# Patient Record
Sex: Female | Born: 1965 | Race: White | Hispanic: No | Marital: Married | Smoking: Never smoker
Health system: Southern US, Community
[De-identification: ages and names within clinical notes are randomized; demographics above are authoritative.]

## PROBLEM LIST (undated history)

## (undated) DIAGNOSIS — E7210 Disorders of sulfur-bearing amino-acid metabolism, unspecified: Secondary | ICD-10-CM

## (undated) DIAGNOSIS — D509 Iron deficiency anemia, unspecified: Secondary | ICD-10-CM

## (undated) DIAGNOSIS — Z8241 Family history of sudden cardiac death: Secondary | ICD-10-CM

## (undated) DIAGNOSIS — D3A029 Benign carcinoid tumor of the large intestine, unspecified portion: Secondary | ICD-10-CM

## (undated) DIAGNOSIS — E559 Vitamin D deficiency, unspecified: Secondary | ICD-10-CM

## (undated) DIAGNOSIS — R002 Palpitations: Secondary | ICD-10-CM

## (undated) DIAGNOSIS — N943 Premenstrual tension syndrome: Secondary | ICD-10-CM

## (undated) DIAGNOSIS — E8840 Mitochondrial metabolism disorder, unspecified: Secondary | ICD-10-CM

## (undated) DIAGNOSIS — E782 Mixed hyperlipidemia: Secondary | ICD-10-CM

## (undated) DIAGNOSIS — E612 Magnesium deficiency: Secondary | ICD-10-CM

## (undated) HISTORY — DX: Mixed hyperlipidemia: E78.2

## (undated) HISTORY — DX: Iron deficiency anemia, unspecified: D50.9

## (undated) HISTORY — DX: Premenstrual tension syndrome: N94.3

## (undated) HISTORY — PX: WISDOM TOOTH EXTRACTION: SHX21

## (undated) HISTORY — DX: Magnesium deficiency: E61.2

## (undated) HISTORY — DX: Palpitations: R00.2

## (undated) HISTORY — PX: COLON SURGERY: SHX602

## (undated) HISTORY — DX: Disorders of sulfur-bearing amino-acid metabolism, unspecified: E72.10

## (undated) HISTORY — PX: COLONOSCOPY: SHX5424

## (undated) HISTORY — DX: Family history of sudden cardiac death: Z82.41

## (undated) HISTORY — DX: Vitamin D deficiency, unspecified: E55.9

## (undated) HISTORY — DX: Mitochondrial metabolism disorder, unspecified: E88.40

---

## 2000-08-21 ENCOUNTER — Other Ambulatory Visit: Admission: RE | Admit: 2000-08-21 | Discharge: 2000-08-21 | Payer: Self-pay | Admitting: *Deleted

## 2002-03-22 ENCOUNTER — Other Ambulatory Visit: Admission: RE | Admit: 2002-03-22 | Discharge: 2002-03-22 | Payer: Self-pay | Admitting: *Deleted

## 2003-01-08 HISTORY — PX: BREAST SURGERY: SHX581

## 2003-08-01 ENCOUNTER — Other Ambulatory Visit: Admission: RE | Admit: 2003-08-01 | Discharge: 2003-08-01 | Payer: Self-pay | Admitting: *Deleted

## 2003-09-27 ENCOUNTER — Ambulatory Visit (HOSPITAL_COMMUNITY): Admission: RE | Admit: 2003-09-27 | Discharge: 2003-09-27 | Payer: Self-pay | Admitting: General Surgery

## 2003-09-27 ENCOUNTER — Encounter (INDEPENDENT_AMBULATORY_CARE_PROVIDER_SITE_OTHER): Payer: Self-pay | Admitting: *Deleted

## 2004-07-18 ENCOUNTER — Other Ambulatory Visit: Admission: RE | Admit: 2004-07-18 | Discharge: 2004-07-18 | Payer: Self-pay | Admitting: *Deleted

## 2004-08-07 ENCOUNTER — Ambulatory Visit: Payer: Self-pay | Admitting: Internal Medicine

## 2005-09-16 ENCOUNTER — Other Ambulatory Visit: Admission: RE | Admit: 2005-09-16 | Discharge: 2005-09-16 | Payer: Self-pay | Admitting: *Deleted

## 2006-11-19 ENCOUNTER — Other Ambulatory Visit: Admission: RE | Admit: 2006-11-19 | Discharge: 2006-11-19 | Payer: Self-pay | Admitting: *Deleted

## 2008-01-28 ENCOUNTER — Other Ambulatory Visit: Admission: RE | Admit: 2008-01-28 | Discharge: 2008-01-28 | Payer: Self-pay | Admitting: Gynecology

## 2010-05-25 NOTE — Op Note (Signed)
NAMEJANECE, LAIDLAW                ACCOUNT NO.:  [id number]   MEDICAL RECORD NO.:  [id number]          PATIENT TYPE:  AMB   LOCATION:  DAY                          FACILITY:  [facility]   PHYSICIAN:  Timothy E. Earlene Plater, M.D. DATE OF BIRTH:  1965/09/28   DATE OF PROCEDURE:  09/27/2003  DATE OF DISCHARGE:                                 OPERATIVE REPORT   PREOPERATIVE DIAGNOSIS:  Persistent mass, right breast.   POSTOPERATIVE DIAGNOSIS:  Persistent mass, right breast.   PROCEDURE:  Excision of mass, right breast.   SURGEON:  Timothy E. Earlene Plater, M.D.   ANESTHESIA:  Local standby.   Ms. Dimas Aguas is an otherwise healthy 45 year old female, gravida 2, para 2,  with a persistent mass of the right breast.  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  She is seen and identified.  She and I both carefully located the  mass in the right lateral breast in the supine position.  It is an  approximately 1 cm slippery nodule.   The patient was taken to the operating room and placed supine, IV sedation  given.  The right breast was prepped and draped in the usual fashion.  At  the marked site, local anesthesia was provided with 0.25% Marcaine with  epinephrine.  A curvilinear incision was made in the skin crease over the  mass.  This was lengthened a little bit because I could not get my fingertip  in to palpate the mass and then to grasp it.  I did that, however, and  removed the mass in the surrounding tissue and cut the specimen on the table  to assure that the fibroadenoma was included.  The surrounding tissue was  carefully examined bimanually and no other masses were noted, though the  remainder of her tissue was quite dense and rubbery.  There was no bleeding  complications.  The wound was closed with interrupted 4-0 Monocryls and  Steri-Strips.  A dry,  sterile dressing was applied.  Counts were correct.  She tolerated the procedure well and was taken to the recovery room in good  condition.  Written and verbal instructions were given.  She will seen in  follow up in the office.     TED/MEDQ  D:  09/27/2003  T:  09/27/2003  Job:  [id number]   cc:   Almedia Balls. Fore, M.D.  [phone] [address] [place]  [place] [address]  Fax: [phone]

## 2014-07-02 ENCOUNTER — Emergency Department (HOSPITAL_COMMUNITY)
Admission: EM | Admit: 2014-07-02 | Discharge: 2014-07-02 | Disposition: A | Discharge status: Home / Self Care | Payer: BLUE CROSS/BLUE SHIELD | Attending: Emergency Medicine | Admitting: Emergency Medicine

## 2014-07-02 ENCOUNTER — Emergency Department (HOSPITAL_COMMUNITY): Payer: BLUE CROSS/BLUE SHIELD

## 2014-07-02 ENCOUNTER — Encounter (HOSPITAL_COMMUNITY): Payer: Self-pay | Admitting: Emergency Medicine

## 2014-07-02 DIAGNOSIS — R0789 Other chest pain: Secondary | ICD-10-CM | POA: Diagnosis not present

## 2014-07-02 DIAGNOSIS — R202 Paresthesia of skin: Secondary | ICD-10-CM | POA: Diagnosis not present

## 2014-07-02 DIAGNOSIS — R079 Chest pain, unspecified: Secondary | ICD-10-CM

## 2014-07-02 DIAGNOSIS — R200 Anesthesia of skin: Secondary | ICD-10-CM | POA: Insufficient documentation

## 2014-07-02 LAB — BASIC METABOLIC PANEL
Anion gap: 6 (ref 5–15)
BUN: 15 mg/dL (ref 6–20)
CO2: 29 mmol/L (ref 22–32)
Calcium: 8.6 mg/dL — ABNORMAL LOW (ref 8.9–10.3)
Chloride: 104 mmol/L (ref 101–111)
Creatinine, Ser: 0.62 mg/dL (ref 0.44–1.00)
GFR calc non Af Amer: >60 mL/min (ref >60)
GLUCOSE: 124 mg/dL — AB (ref 65–99)
Potassium: 3.5 mmol/L (ref 3.5–5.1)
SODIUM: 139 mmol/L (ref 135–145)

## 2014-07-02 LAB — CBC
HEMATOCRIT: 34.2 % — AB (ref 36.0–46.0)
HEMOGLOBIN: 10.2 g/dL — AB (ref 12.0–15.0)
MCH: 21.7 pg — AB (ref 26.0–34.0)
MCHC: 29.8 g/dL — AB (ref 30.0–36.0)
MCV: 72.8 fL — ABNORMAL LOW (ref 78.0–100.0)
Platelets: 257 10*3/uL (ref 150–400)
RBC: 4.7 MIL/uL (ref 3.87–5.11)
RDW: 18.2 % — AB (ref 11.5–15.5)
WBC: 7.2 10*3/uL (ref 4.0–10.5)

## 2014-07-02 LAB — I-STAT TROPONIN, ED: TROPONIN I, POC: 0 ng/mL (ref 0.00–0.08)

## 2014-07-02 MED ORDER — PANTOPRAZOLE SODIUM 20 MG PO TBEC: 20.0000 mg | DELAYED_RELEASE_TABLET | Freq: Every day | ORAL | Status: DC

## 2014-07-02 MED ORDER — SUCRALFATE 1 G PO TABS: 1.0000 g | ORAL_TABLET | Freq: Three times a day (TID) | ORAL | Status: DC

## 2014-07-02 NOTE — ED Notes (Signed)
EKG delayed due to technical issues. Patient in NAD.

## 2014-07-02 NOTE — Discharge Instructions (Signed)
Chest Pain (Nonspecific) °It is often hard to give a specific diagnosis for the cause of chest pain. There is always a chance that your pain could be related to something serious, such as a heart attack or a blood clot in the lungs. You need to follow up with your health care provider for further evaluation. °CAUSES  °· Heartburn. °· Pneumonia or bronchitis. °· Anxiety or stress. °· Inflammation around your heart (pericarditis) or lung (pleuritis or pleurisy). °· A blood clot in the lung. °· A collapsed lung (pneumothorax). It can develop suddenly on its own (spontaneous pneumothorax) or from trauma to the chest. °· Shingles infection (herpes zoster virus). °The chest wall is composed of bones, muscles, and cartilage. Any of these can be the source of the pain. °· The bones can be bruised by injury. °· The muscles or cartilage can be strained by coughing or overwork. °· The cartilage can be affected by inflammation and become sore (costochondritis). °DIAGNOSIS  °Lab tests or other studies may be needed to find the cause of your pain. Your health care provider may have you take a test called an ambulatory electrocardiogram (ECG). An ECG records your heartbeat patterns over a 24-hour period. You may also have other tests, such as: °· Transthoracic echocardiogram (TTE). During echocardiography, sound waves are used to evaluate how blood flows through your heart. °· Transesophageal echocardiogram (TEE). °· Cardiac monitoring. This allows your health care provider to monitor your heart rate and rhythm in real time. °· Holter monitor. This is a portable device that records your heartbeat and can help diagnose heart arrhythmias. It allows your health care provider to track your heart activity for several days, if needed. °· Stress tests by exercise or by giving medicine that makes the heart beat faster. °TREATMENT  °· Treatment depends on what may be causing your chest pain. Treatment may include: °¨ Acid blockers for  heartburn. °¨ Anti-inflammatory medicine. °¨ Pain medicine for inflammatory conditions. °¨ Antibiotics if an infection is present. °· You may be advised to change lifestyle habits. This includes stopping smoking and avoiding alcohol, caffeine, and chocolate. °· You may be advised to keep your head raised (elevated) when sleeping. This reduces the chance of acid going backward from your stomach into your esophagus. °Most of the time, nonspecific chest pain will improve within 2-3 days with rest and mild pain medicine.  °HOME CARE INSTRUCTIONS  °· If antibiotics were prescribed, take them as directed. Finish them even if you start to feel better. °· For the next few days, avoid physical activities that bring on chest pain. Continue physical activities as directed. °· Do not use any tobacco products, including cigarettes, chewing tobacco, or electronic cigarettes. °· Avoid drinking alcohol. °· Only take medicine as directed by your health care provider. °· Follow your health care provider's suggestions for further testing if your chest pain does not go away. °· Keep any follow-up appointments you made. If you do not go to an appointment, you could develop lasting (chronic) problems with pain. If there is any problem keeping an appointment, call to reschedule. °SEEK MEDICAL CARE IF:  °· Your chest pain does not go away, even after treatment. °· You have a rash with blisters on your chest. °· You have a fever. °SEEK IMMEDIATE MEDICAL CARE IF:  °· You have increased chest pain or pain that spreads to your arm, neck, jaw, back, or abdomen. °· You have shortness of breath. °· You have an increasing cough, or you cough   up blood.  You have severe back or abdominal pain.  You feel nauseous or vomit.  You have severe weakness.  You faint.  You have chills. This is an emergency. Do not wait to see if the pain will go away. Get medical help at once. Call your local emergency services (911 in [place] Do not drive  yourself to the hospital. MAKE SURE YOU:   Understand these instructions.  Will watch your condition.  Will get help right away if you are not doing well or get worse. Document Released: 10/03/2004 Document Revised: 12/29/2012 Document Reviewed: 07/30/2007 [facility] Patient Information 2015 [place], [place]. This information is not intended to replace advice given to you by your health care provider. Make sure you discuss any questions you have with your health care provider. Paresthesia Paresthesia is an abnormal burning or prickling sensation. This sensation is generally felt in the hands, arms, legs, or feet. However, it may occur in any part of the body. It is usually not painful. The feeling may be described as:  Tingling or numbness.  "Pins and needles."  Skin crawling.  Buzzing.  Limbs "falling asleep."  Itching. Most people experience temporary (transient) paresthesia at some time in their lives. CAUSES  Paresthesia may occur when you breathe too quickly (hyperventilation). It can also occur without any apparent cause. Commonly, paresthesia occurs when pressure is placed on a nerve. The feeling quickly goes away once the pressure is removed. For some people, however, paresthesia is a long-lasting (chronic) condition caused by an underlying disorder. The underlying disorder may be:  A traumatic, direct injury to nerves. Examples include a:  Broken (fractured) neck.  Fractured skull.  A disorder affecting the brain and spinal cord (central nervous system). Examples include:  Transverse myelitis.  Encephalitis.  Transient ischemic attack.  Multiple sclerosis.  Stroke.  Tumor or blood vessel problems, such as an arteriovenous malformation pressing against the brain or spinal cord.  A condition that damages the peripheral nerves (peripheral neuropathy). Peripheral nerves are not part of the brain and spinal cord. These conditions include:  Diabetes.  Peripheral  vascular disease.  Nerve entrapment syndromes, such as carpal tunnel syndrome.  Shingles.  Hypothyroidism.  Vitamin B12 deficiencies.  Alcoholism.  Heavy metal poisoning (lead, arsenic).  Rheumatoid arthritis.  Systemic lupus erythematosus. DIAGNOSIS  Your caregiver will attempt to find the underlying cause of your paresthesia. Your caregiver may:  Take your medical history.  Perform a physical exam.  Order various lab tests.  Order imaging tests. TREATMENT  Treatment for paresthesia depends on the underlying cause. HOME CARE INSTRUCTIONS  Avoid drinking alcohol.  You may consider massage or acupuncture to help relieve your symptoms.  Keep all follow-up appointments as directed by your caregiver. SEEK IMMEDIATE MEDICAL CARE IF:   You feel weak.  You have trouble walking or moving.  You have problems with speech or vision.  You feel confused.  You cannot control your bladder or bowel movements.  You feel numbness after an injury.  You faint.  Your burning or prickling feeling gets worse when walking.  You have pain, cramps, or dizziness.  You develop a rash. MAKE SURE YOU:  Understand these instructions.  Will watch your condition.  Will get help right away if you are not doing well or get worse. Document Released: 12/14/2001 Document Revised: 03/18/2011 Document Reviewed: 09/14/2010 [facility] Patient Information 2015 [place], [place]. This information is not intended to replace advice given to you by your health care provider. Make sure you  discuss any questions you have with your health care provider. ° °

## 2014-07-02 NOTE — ED Notes (Signed)
Pt from home c/o central chest pain, left arm tingling and numbness x few days. She reports recent travel to [place]. She denies nausea, shortness of breath, or dizziness. She reports she is very fit but had a family member die from a heart attack at an earyl age.

## 2014-07-02 NOTE — ED Notes (Signed)
Pt reports numbness on the left side of her body.

## 2014-07-02 NOTE — ED Provider Notes (Signed)
CSN: [id number]     Arrival date & time 07/02/14  0009 History   First MD Initiated Contact with Patient 07/02/14 0041     Chief Complaint  Patient presents with  . Chest Pain     (Consider location/radiation/quality/duration/timing/severity/associated sxs/prior Treatment) Patient is a 49 y.o. female presenting with chest pain. The history is provided by the patient. No language interpreter was used.  Chest Pain Pain location:  L chest and R chest Pain radiates to:  Does not radiate Pain radiates to the back: no   Associated symptoms: numbness   Associated symptoms: no abdominal pain, no cough, no fever, no headache, no nausea, no shortness of breath, not vomiting and no weakness   Associated symptoms comment:  She complains of bilateral chest pain that has been intermittent for the past 4 days. No SOB, nausea or vomiting. No fever. She started taking Clindamycin following a dental procedure for infection of an upper left molar tooth the day prior to start of symptoms. She has been able to exercise daily per her usual work out schedule without recurrent chest pain. Today she started having numbness in the left arm and leg without weakness which prompted a visit to the emergency department.    History reviewed. No pertinent past medical history. Past Surgical History  Procedure Laterality Date  . Cardiac surgery     No family history on file. History  Substance Use Topics  . Smoking status: Never Smoker   . Smokeless tobacco: Not on file  . Alcohol Use: Yes     Comment: social   OB History    No data available     Review of Systems  Constitutional: Negative for fever and chills.  HENT: Negative.  Negative for congestion and sore throat.   Eyes: Negative for visual disturbance.  Respiratory: Negative.  Negative for cough and shortness of breath.   Cardiovascular: Positive for chest pain.  Gastrointestinal: Negative.  Negative for nausea, vomiting and abdominal pain.   Musculoskeletal: Negative.  Negative for neck pain and neck stiffness.  Skin: Negative.   Neurological: Positive for numbness. Negative for facial asymmetry, weakness and headaches.      Allergies  Review of patient's allergies indicates no known allergies.  Home Medications   Prior to Admission medications   Not on File   BP 103/77 mmHg  Pulse 69  Temp(Src) 98.6 F (37 C) (Oral)  Resp 18  Wt 128 lb (58.06 kg)  SpO2 100%  LMP 01/31/2014 Physical Exam  Constitutional: She is oriented to person, place, and time. She appears well-developed and well-nourished.  HENT:  Head: Normocephalic.  Eyes: Pupils are equal, round, and reactive to light.  Neck: Normal range of motion. Neck supple.  Cardiovascular: Normal rate and regular rhythm.   Pulmonary/Chest: Effort normal and breath sounds normal.  Abdominal: Soft. Bowel sounds are normal. There is no tenderness. There is no rebound and no guarding.  Musculoskeletal: Normal range of motion.  Neurological: She is alert and oriented to person, place, and time. She has normal strength and normal reflexes. No sensory deficit. She displays a negative Romberg sign. Coordination normal.  CN's 3-12 grossly intact. Speech clear and focused. No deficits of coordination. Ambulatory without imbalance.   Skin: Skin is warm and dry. No rash noted.  Psychiatric: She has a normal mood and affect.    ED Course  Procedures (including critical care time) Labs Review Labs Reviewed  [facility] [place], ED  Results for orders placed or performed during the hospital encounter of 07/02/14  CBC  Result Value Ref Range   WBC 7.2 4.0 - 10.5 K/uL   RBC 4.70 3.87 - 5.11 MIL/uL   Hemoglobin 10.2 (L) 12.0 - 15.0 g/dL   HCT 34.2 (L) 36.0 - 46.0 %   MCV 72.8 (L) 78.0 - 100.0 fL   MCH 21.7 (L) 26.0 - 34.0 pg   MCHC 29.8 (L) 30.0 - 36.0 g/dL   RDW 18.2 (H) 11.5 - 15.5 %   Platelets 257 150 - 400 K/uL  Basic metabolic panel   Result Value Ref Range   Sodium 139 135 - 145 mmol/L   Potassium 3.5 3.5 - 5.1 mmol/L   Chloride 104 101 - 111 mmol/L   CO2 29 22 - 32 mmol/L   Glucose, Bld 124 (H) 65 - 99 mg/dL   BUN 15 6 - 20 mg/dL   Creatinine, Ser 0.62 0.44 - 1.00 mg/dL   Calcium 8.6 (L) 8.9 - 10.3 mg/dL   GFR calc non Af Amer >60 >60 mL/min   GFR calc Af Amer >60 >60 mL/min   Anion gap 6 5 - 15  I-stat troponin, ED  (not at [facility], [facility])  Result Value Ref Range   Troponin i, poc 0.00 0.00 - 0.08 ng/mL   Comment 3           Dg Chest 2 View  07/02/2014   CLINICAL DATA:  Chest pain  EXAM: CHEST  2 VIEW  COMPARISON:  None.  FINDINGS: Lungs are clear.  No pleural effusion or pneumothorax.  The heart is normal in size.  Visualized osseous structures are within normal limits.  IMPRESSION: No evidence of acute cardiopulmonary disease.   Electronically Signed   By: Julian Hy M.D.   On: 07/02/2014 01:33   Ct Head Wo Contrast  07/02/2014   CLINICAL DATA:  Left body numbness  EXAM: CT HEAD WITHOUT CONTRAST  TECHNIQUE: Contiguous axial images were obtained from the base of the skull through the vertex without intravenous contrast.  COMPARISON:  None.  FINDINGS: No evidence of parenchymal hemorrhage or extra-axial fluid collection. No mass lesion, mass effect, or midline shift.  No CT evidence of acute infarction.  Cerebral volume is within normal limits.  No ventriculomegaly.  The visualized paranasal sinuses are essentially clear. The mastoid air cells are unopacified.  No evidence of calvarial fracture.  IMPRESSION: Normal head CT.   Electronically Signed   By: Julian Hy M.D.   On: 07/02/2014 01:49     Imaging Review No results found.   EKG Interpretation   Date/Time:  Saturday July 02 2014 00:33:14 EDT Ventricular Rate:  65 PR Interval:  145 QRS Duration: 85 QT Interval:  426 QTC Calculation: 443 R Axis:   80 Text Interpretation:  Sinus rhythm No old tracing to compare Confirmed by  OTTER  MD, OLGA  ([id number]) on 07/02/2014 12:44:17 AM      MDM   Final diagnoses:  Chest pain    1. Nonspecific Chest pain 2. Paresthesia  She appears comfortable in the emergency department. Her neurologic exam is non-focal and her cardiac work up is negative.  CT head negative She is evaluated by Dr. Sharol Given and is found to be stable for discharge home. She will see her doctor for further outpatient evaluation of intermittent chest pain and [facility] for evaluation of paresthesia.     Charlann Lange, PA-C 07/02/14 [place], MD 07/02/14 [phone]

## 2014-07-21 ENCOUNTER — Encounter (HOSPITAL_COMMUNITY): Payer: Self-pay | Admitting: *Deleted

## 2014-07-21 ENCOUNTER — Emergency Department (HOSPITAL_COMMUNITY)
Admission: EM | Admit: 2014-07-21 | Discharge: 2014-07-21 | Disposition: A | Discharge status: Home / Self Care | Payer: BLUE CROSS/BLUE SHIELD | Attending: Emergency Medicine | Admitting: Emergency Medicine

## 2014-07-21 DIAGNOSIS — R112 Nausea with vomiting, unspecified: Secondary | ICD-10-CM | POA: Diagnosis not present

## 2014-07-21 DIAGNOSIS — Z3202 Encounter for pregnancy test, result negative: Secondary | ICD-10-CM | POA: Insufficient documentation

## 2014-07-21 DIAGNOSIS — Z880 Allergy status to penicillin: Secondary | ICD-10-CM | POA: Insufficient documentation

## 2014-07-21 DIAGNOSIS — R1084 Generalized abdominal pain: Secondary | ICD-10-CM | POA: Diagnosis not present

## 2014-07-21 LAB — CBC WITH DIFFERENTIAL/PLATELET
BASOS ABS: 0 10*3/uL (ref 0.0–0.1)
Basophils Relative: 0 % (ref 0–1)
EOS ABS: 0 10*3/uL (ref 0.0–0.7)
Eosinophils Relative: 0 % (ref 0–5)
HEMATOCRIT: 37.5 % (ref 36.0–46.0)
HEMOGLOBIN: 11.1 g/dL — AB (ref 12.0–15.0)
LYMPHS ABS: 0.8 10*3/uL (ref 0.7–4.0)
LYMPHS PCT: 11 % — AB (ref 12–46)
MCH: 21.5 pg — ABNORMAL LOW (ref 26.0–34.0)
MCHC: 29.6 g/dL — ABNORMAL LOW (ref 30.0–36.0)
MCV: 72.5 fL — ABNORMAL LOW (ref 78.0–100.0)
MONOS PCT: 3 % (ref 3–12)
Monocytes Absolute: 0.2 10*3/uL (ref 0.1–1.0)
NEUTROS ABS: 5.9 10*3/uL (ref 1.7–7.7)
Neutrophils Relative %: 86 % — ABNORMAL HIGH (ref 43–77)
Platelets: 263 10*3/uL (ref 150–400)
RBC: 5.17 MIL/uL — AB (ref 3.87–5.11)
RDW: 17 % — ABNORMAL HIGH (ref 11.5–15.5)
WBC: 6.9 10*3/uL (ref 4.0–10.5)

## 2014-07-21 LAB — URINALYSIS, ROUTINE W REFLEX MICROSCOPIC
Bilirubin Urine: NEGATIVE
Glucose, UA: NEGATIVE mg/dL
Hgb urine dipstick: NEGATIVE
Leukocytes, UA: NEGATIVE
Nitrite: NEGATIVE
PROTEIN: NEGATIVE mg/dL
SPECIFIC GRAVITY, URINE: 1.028 (ref 1.005–1.030)
UROBILINOGEN UA: 0.2 mg/dL (ref 0.0–1.0)
pH: 6.5 (ref 5.0–8.0)

## 2014-07-21 LAB — COMPREHENSIVE METABOLIC PANEL
ALT: 13 U/L — AB (ref 14–54)
ANION GAP: 7 (ref 5–15)
AST: 22 U/L (ref 15–41)
Albumin: 3.9 g/dL (ref 3.5–5.0)
Alkaline Phosphatase: 54 U/L (ref 38–126)
BILIRUBIN TOTAL: 0.5 mg/dL (ref 0.3–1.2)
BUN: 14 mg/dL (ref 6–20)
CALCIUM: 8.7 mg/dL — AB (ref 8.9–10.3)
CHLORIDE: 102 mmol/L (ref 101–111)
CO2: 27 mmol/L (ref 22–32)
Creatinine, Ser: 0.72 mg/dL (ref 0.44–1.00)
Glucose, Bld: 127 mg/dL — ABNORMAL HIGH (ref 65–99)
POTASSIUM: 3.7 mmol/L (ref 3.5–5.1)
SODIUM: 136 mmol/L (ref 135–145)
TOTAL PROTEIN: 7 g/dL (ref 6.5–8.1)

## 2014-07-21 LAB — URINE MICROSCOPIC-ADD ON

## 2014-07-21 LAB — LIPASE, BLOOD: Lipase: 18 U/L — ABNORMAL LOW (ref 22–51)

## 2014-07-21 LAB — PREGNANCY, URINE: Preg Test, Ur: NEGATIVE

## 2014-07-21 MED ORDER — FENTANYL CITRATE (PF) 100 MCG/2ML IJ SOLN
50.0000 ug | Freq: Once | INTRAMUSCULAR | Status: AC
  Administered 2014-07-21: 50 ug via INTRAVENOUS
  Filled 2014-07-21: qty 2

## 2014-07-21 MED ORDER — ONDANSETRON HCL 4 MG PO TABS: 4.0000 mg | ORAL_TABLET | Freq: Four times a day (QID) | ORAL | Status: DC

## 2014-07-21 MED ORDER — ONDANSETRON HCL 4 MG/2ML IJ SOLN
4.0000 mg | Freq: Once | INTRAMUSCULAR | Status: AC
  Administered 2014-07-21: 4 mg via INTRAVENOUS
  Filled 2014-07-21: qty 2

## 2014-07-21 MED ORDER — SODIUM CHLORIDE 0.9 % IV BOLUS (SEPSIS)
1000.0000 mL | Freq: Once | INTRAVENOUS | Status: AC
  Administered 2014-07-21: 1000 mL via INTRAVENOUS

## 2014-07-21 NOTE — ED Notes (Signed)
Pt reminded of need for UA 

## 2014-07-21 NOTE — Discharge Instructions (Signed)
Get re-evaluated if you develop recurrent abdominal pain.     Abdominal Pain, Women Abdominal (stomach, pelvic, or belly) pain can be caused by many things. It is important to tell your doctor:  The location of the pain.  Does it come and go or is it present all the time?  Are there things that start the pain (eating certain foods, exercise)?  Are there other symptoms associated with the pain (fever, nausea, vomiting, diarrhea)? All of this is helpful to know when trying to find the cause of the pain. CAUSES   Stomach: virus or bacteria infection, or ulcer.  Intestine: appendicitis (inflamed appendix), regional ileitis (Crohn's disease), ulcerative colitis (inflamed colon), irritable bowel syndrome, diverticulitis (inflamed diverticulum of the colon), or cancer of the stomach or intestine.  Gallbladder disease or stones in the gallbladder.  Kidney disease, kidney stones, or infection.  Pancreas infection or cancer.  Fibromyalgia (pain disorder).  Diseases of the female organs:  Uterus: fibroid (non-cancerous) tumors or infection.  Fallopian tubes: infection or tubal pregnancy.  Ovary: cysts or tumors.  Pelvic adhesions (scar tissue).  Endometriosis (uterus lining tissue growing in the pelvis and on the pelvic organs).  Pelvic congestion syndrome (female organs filling up with blood just before the menstrual period).  Pain with the menstrual period.  Pain with ovulation (producing an egg).  Pain with an IUD (intrauterine device, birth control) in the uterus.  Cancer of the female organs.  Functional pain (pain not caused by a disease, may improve without treatment).  Psychological pain.  Depression. DIAGNOSIS  Your doctor will decide the seriousness of your pain by doing an examination.  Blood tests.  X-rays.  Ultrasound.  CT scan (computed tomography, special type of X-ray).  MRI (magnetic resonance imaging).  Cultures, for infection.  Barium  enema (dye inserted in the large intestine, to better view it with X-rays).  Colonoscopy (looking in intestine with a lighted tube).  Laparoscopy (minor surgery, looking in abdomen with a lighted tube).  Major abdominal exploratory surgery (looking in abdomen with a large incision). TREATMENT  The treatment will depend on the cause of the pain.   Many cases can be observed and treated at home.  Over-the-counter medicines recommended by your caregiver.  Prescription medicine.  Antibiotics, for infection.  Birth control pills, for painful periods or for ovulation pain.  Hormone treatment, for endometriosis.  Nerve blocking injections.  Physical therapy.  Antidepressants.  Counseling with a psychologist or psychiatrist.  Minor or major surgery. HOME CARE INSTRUCTIONS   Do not take laxatives, unless directed by your caregiver.  Take over-the-counter pain medicine only if ordered by your caregiver. Do not take aspirin because it can cause an upset stomach or bleeding.  Try a clear liquid diet (broth or water) as ordered by your caregiver. Slowly move to a bland diet, as tolerated, if the pain is related to the stomach or intestine.  Have a thermometer and take your temperature several times a day, and record it.  Bed rest and sleep, if it helps the pain.  Avoid sexual intercourse, if it causes pain.  Avoid stressful situations.  Keep your follow-up appointments and tests, as your caregiver orders.  If the pain does not go away with medicine or surgery, you may try:  Acupuncture.  Relaxation exercises (yoga, meditation).  Group therapy.  Counseling. SEEK MEDICAL CARE IF:   You notice certain foods cause stomach pain.  Your home care treatment is not helping your pain.  You need stronger pain  medicine.  You want your IUD removed.  You feel faint or lightheaded.  You develop nausea and vomiting.  You develop a rash.  You are having side effects or an  allergy to your medicine. SEEK IMMEDIATE MEDICAL CARE IF:   Your pain does not go away or gets worse.  You have a fever.  Your pain is felt only in portions of the abdomen. The right side could possibly be appendicitis. The left lower portion of the abdomen could be colitis or diverticulitis.  You are passing blood in your stools (bright red or black tarry stools, with or without vomiting).  You have blood in your urine.  You develop chills, with or without a fever.  You pass out. MAKE SURE YOU:   Understand these instructions.  Will watch your condition.  Will get help right away if you are not doing well or get worse. Document Released: 10/21/2006 Document Revised: 05/10/2013 Document Reviewed: 11/10/2008 [facility] Patient Information 2015 [place], [place]. This information is not intended to replace advice given to you by your health care provider. Make sure you discuss any questions you have with your health care provider.  Nausea and Vomiting Nausea is a sick feeling that often comes before throwing up (vomiting). Vomiting is a reflex where stomach contents come out of your mouth. Vomiting can cause severe loss of body fluids (dehydration). Children and elderly adults can become dehydrated quickly, especially if they also have diarrhea. Nausea and vomiting are symptoms of a condition or disease. It is important to find the cause of your symptoms. CAUSES   Direct irritation of the stomach lining. This irritation can result from increased acid production (gastroesophageal reflux disease), infection, food poisoning, taking certain medicines (such as nonsteroidal anti-inflammatory drugs), alcohol use, or tobacco use.  Signals from the brain.These signals could be caused by a headache, heat exposure, an inner ear disturbance, increased pressure in the brain from injury, infection, a tumor, or a concussion, pain, emotional stimulus, or metabolic problems.  An obstruction in the  gastrointestinal tract (bowel obstruction).  Illnesses such as diabetes, hepatitis, gallbladder problems, appendicitis, kidney problems, cancer, sepsis, atypical symptoms of a heart attack, or eating disorders.  Medical treatments such as chemotherapy and radiation.  Receiving medicine that makes you sleep (general anesthetic) during surgery. DIAGNOSIS Your caregiver may ask for tests to be done if the problems do not improve after a few days. Tests may also be done if symptoms are severe or if the reason for the nausea and vomiting is not clear. Tests may include:  Urine tests.  Blood tests.  Stool tests.  Cultures (to look for evidence of infection).  X-rays or other imaging studies. Test results can help your caregiver make decisions about treatment or the need for additional tests. TREATMENT You need to stay well hydrated. Drink frequently but in small amounts.You may wish to drink water, sports drinks, clear broth, or eat frozen ice pops or gelatin dessert to help stay hydrated.When you eat, eating slowly may help prevent nausea.There are also some antinausea medicines that may help prevent nausea. HOME CARE INSTRUCTIONS   Take all medicine as directed by your caregiver.  If you do not have an appetite, do not force yourself to eat. However, you must continue to drink fluids.  If you have an appetite, eat a normal diet unless your caregiver tells you differently.  Eat a variety of complex carbohydrates (rice, wheat, potatoes, bread), lean meats, yogurt, fruits, and vegetables.  Avoid high-fat foods because  they are more difficult to digest.  Drink enough water and fluids to keep your urine clear or pale yellow.  If you are dehydrated, ask your caregiver for specific rehydration instructions. Signs of dehydration may include:  Severe thirst.  Dry lips and mouth.  Dizziness.  Dark urine.  Decreasing urine frequency and amount.  Confusion.  Rapid breathing or  pulse. SEEK IMMEDIATE MEDICAL CARE IF:   You have blood or brown flecks (like coffee grounds) in your vomit.  You have black or bloody stools.  You have a severe headache or stiff neck.  You are confused.  You have severe abdominal pain.  You have chest pain or trouble breathing.  You do not urinate at least once every 8 hours.  You develop cold or clammy skin.  You continue to vomit for longer than 24 to 48 hours.  You have a fever. MAKE SURE YOU:   Understand these instructions.  Will watch your condition.  Will get help right away if you are not doing well or get worse. Document Released: 12/24/2004 Document Revised: 03/18/2011 Document Reviewed: 05/23/2010 [facility] Patient Information 2015 [place], [place]. This information is not intended to replace advice given to you by your health care provider. Make sure you discuss any questions you have with your health care provider.

## 2014-07-21 NOTE — ED Notes (Addendum)
Pt reports abd pain 8/10 and vomiting starting 7/13 fat 2300, vomited x5.pt was suppose to go on vacation today, but could not due to pain. Pain increases laying supine, pain decreases with knees bent in supine position. Last bowel movement 7/13 around 2100. Denies blood in vomiting or stool. Denies diarrhea or dysuria. Reports slight SOB, able to speak in full sentences.

## 2014-07-21 NOTE — ED Provider Notes (Signed)
CSN: [id number]     Arrival date & time 07/21/14  8250 History   First MD Initiated Contact with Patient 07/21/14 [phone]     Chief Complaint  Patient presents with  . Abdominal Pain  . Emesis     Patient is a 49 y.o. female presenting with abdominal pain and vomiting. The history is provided by the patient. No language interpreter was used.  Abdominal Pain Associated symptoms: vomiting   Emesis Associated symptoms: abdominal pain    Brittney [name] presents for evaluation of abdominal pain, vomiting.she began feeling poorly around 11 PM last night. Symptoms started with diffuse, constant abdominal pain followed by vomiting approximately 5 times. No hematemesis. No diarrhea or constipation. She denies any fevers, chest pain, dysuria. She has no known sick contacts or bad food exposures. No prior abdominal surgeries. Symptoms are moderate and constant.  History reviewed. No pertinent past medical history. Past Surgical History  Procedure Laterality Date  . Cardiac surgery     History reviewed. No pertinent family history. History  Substance Use Topics  . Smoking status: Never Smoker   . Smokeless tobacco: Not on file  . Alcohol Use: Yes     Comment: social/ 1 glass wine daily   OB History    No data available     Review of Systems  Gastrointestinal: Positive for vomiting and abdominal pain.  All other systems reviewed and are negative.     Allergies  Other; Penicillins; and Percocet  Home Medications   Prior to Admission medications   Medication Sig Start Date End Date Taking? Authorizing Provider  EPINEPHrine 0.3 mg/0.3 mL IJ SOAJ injection Inject 0.3 mg into the muscle as needed. In case of allergic reaction   Yes Historical Provider, MD  pantoprazole (PROTONIX) 20 MG tablet Take 1 tablet (20 mg total) by mouth daily. Patient not taking: Reported on 07/21/2014 07/02/14   Charlann Lange, PA-C  sucralfate (CARAFATE) 1 G tablet Take 1 tablet (1 g total) by mouth 4 (four) times  daily -  with meals and at bedtime. Patient not taking: Reported on 07/21/2014 07/02/14   Charlann Lange, PA-C   BP 118/67 mmHg  Pulse 67  Temp(Src) 97.9 F (36.6 C) (Oral)  Resp 20  SpO2 97%  LMP 01/31/2014 Physical Exam  Constitutional: She is oriented to person, place, and time. She appears well-developed and well-nourished.  HENT:  Head: Normocephalic and atraumatic.  Cardiovascular: Normal rate and regular rhythm.   No murmur heard. Pulmonary/Chest: Effort normal and breath sounds normal. No respiratory distress.  Abdominal: Soft. There is no rebound and no guarding.  Mild diffuse abdominal tenderness  Musculoskeletal: She exhibits no edema or tenderness.  Neurological: She is alert and oriented to person, place, and time.  Skin: Skin is warm and dry.  Psychiatric: She has a normal mood and affect. Her behavior is normal.  Nursing note and vitals reviewed.   ED Course  Procedures (including critical care time) Labs Review Labs Reviewed  COMPREHENSIVE METABOLIC PANEL - Abnormal; Notable for the following:    Glucose, Bld 127 (*)    Calcium 8.7 (*)    ALT 13 (*)    All other components within normal limits  CBC WITH DIFFERENTIAL/PLATELET - Abnormal; Notable for the following:    RBC 5.17 (*)    Hemoglobin 11.1 (*)    MCV 72.5 (*)    MCH 21.5 (*)    MCHC 29.6 (*)    RDW 17.0 (*)    Neutrophils Relative %  86 (*)    Lymphocytes Relative 11 (*)    All other components within normal limits  LIPASE, BLOOD - Abnormal; Notable for the following:    Lipase 18 (*)    All other components within normal limits  URINALYSIS, ROUTINE W REFLEX MICROSCOPIC (NOT AT [facility]) - Abnormal; Notable for the following:    APPearance TURBID (*)    Ketones, ur >80 (*)    All other components within normal limits  URINE MICROSCOPIC-ADD ON - Abnormal; Notable for the following:    Squamous Epithelial / LPF MANY (*)    Bacteria, UA FEW (*)    All other components within normal limits   PREGNANCY, URINE    Imaging Review No results found.   EKG Interpretation None      MDM   Final diagnoses:  Generalized abdominal pain  Non-intractable vomiting with nausea, vomiting of unspecified type    Patient here for evaluation of abdominal pain and vomiting. Patient with mild diffuse tenderness on initial evaluation, on repeat evaluation patient's abdomen is soft and nontender. She is tolerating oral fluids without difficulty. UA is not consistent with UTI. Discussed with patient's home care for nausea, vomiting, abdominal pain. Question viral gastritis versus food poisoning. Presentation is not consistent with cholecystitis or appendicitis. Discussed with patient home care for vomiting and abdominal pain as well as close return precautions. Recommend repeat abdominal examination at 24 hours if she has recurrent abdominal pain.    Quintella Reichert, MD 07/21/14 [phone]

## 2015-12-24 NOTE — Progress Notes (Signed)
Cardiology Office Note   Date:  12/28/2015   ID:  Brittney [name], DOB 1965-04-27, MRN [id number]  PCP:  Lollie Sails, MD  Cardiologist:   Minus Breeding, MD  Referring:  Lollie Sails, MD  Chief Complaint  Patient presents with  . Palpitations      History of Present Illness: Brittney [name] is a 50 y.o. female who presents for evaluation of palpitations. She was noted to have perhaps a slow or delayed pulses she described it when she was getting acupuncture and again when she was seen by her primary provider. She has some family history of sudden death is worried her. Given this she went to see a cardiologist. She's actually quite active. She exercises routinely. She might have some shortness of breath with significant aerobic activity. She might rarely have palpitations at night. Otherwise she denies chest pressure, neck or arm discomfort. She's had no presyncope or syncope. She has no resting shortness of breath, PND or orthopnea. Her family history is interesting with a young cousin on the maternal side in a young cousin on her paternal side dying suddenly apparently of coronary disease but she's not clear of the details.   Past Medical History:  Diagnosis Date  . 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 Family history of sudden cardiac death   . Heart palpitations   . Iron deficiency anemia   . Magnesium deficiency   . 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 Mixed hyperlipidemia   . Premenstrual tension syndrome   . Vitamin D deficiency     Past Surgical History:  Procedure Laterality Date  . CESAREAN SECTION       Current Outpatient Prescriptions  Medication Sig Dispense Refill  . Ascorbic Acid (VITAMIN C) 1000 MG tablet Take 1,000 mg by mouth daily.    Marland Kitchen B-Complex-C-Biotin-Fe & FA (DIALYVITE 800/IRON) 29-0.8 MG TABS Take 1 tablet by mouth daily.    . Cholecalciferol ([employer] D3) 5000 units capsule Take 5,000 Units by mouth daily.    Marland Kitchen  EPINEPHrine 0.3 mg/0.3 mL IJ SOAJ injection Inject 0.3 mg into the muscle as needed. In case of allergic reaction    . Magnesium Bisglycinate (MAG GLYCINATE PO) Take 2 tablets by mouth daily.    . Multiple Vitamin (MULTIVITAMIN WITH MINERALS) TABS tablet Take 1 tablet by mouth daily.    . Probiotic Product (PRO-BIOTIC BLEND PO) Take by mouth.     No current facility-administered medications for this visit.     Allergies:   Other; Penicillins; and Percocet [oxycodone-acetaminophen]    Social History:  The patient  reports that she has never smoked. She has never used smokeless tobacco. She reports that she drinks alcohol.   Family History:  The patient's family history includes Cancer in her father and mother; Sudden Cardiac Death (age of onset: 50) in her cousin; Sudden Cardiac Death (age of onset: 24) in her cousin.    ROS:  Please see the history of present illness.   Otherwise, review of systems are positive for none.   All other systems are reviewed and negative.    PHYSICAL EXAM: VS:  BP 100/66   Pulse 79   Ht 5\' 5"  (1.651 m)   Wt 133 lb (60.3 kg)   LMP 01/31/2014   BMI 22.13 kg/m  , BMI Body mass index is 22.13 kg/m. GENERAL:  Well appearing HEENT:  Pupils equal round and reactive, fundi not visualized, oral mucosa unremarkable NECK:  No jugular venous distention, waveform within  normal limits, carotid upstroke brisk and symmetric, no bruits, no thyromegaly LYMPHATICS:  No cervical, inguinal adenopathy LUNGS:  Clear to auscultation bilaterally BACK:  No CVA tenderness CHEST:  Unremarkable HEART:  PMI not displaced or sustained,S1 and S2 within normal limits, no S3, no S4, no clicks, no rubs, no murmurs ABD:  Flat, positive bowel sounds normal in frequency in pitch, no bruits, no rebound, no guarding, no midline pulsatile mass, no hepatomegaly, no splenomegaly EXT:  2 plus pulses throughout, no edema, no cyanosis no clubbing SKIN:  No rashes no nodules NEURO:  Cranial  nerves II through XII grossly intact, motor grossly intact throughout PSYCH:  Cognitively intact, oriented to person place and time    EKG:  EKG is ordered today. The ekg ordered today demonstrates sinus rhythm, rate 79, axis within normal limits, intervals within normal limits, no acute ST-T wave changes.   Recent Labs: No results found for requested labs within last 8760 hours.    Lipid Panel No results found for: CHOL, TRIG, HDL, CHOLHDL, VLDL, LDLCALC, LDLDIRECT    Wt Readings from Last 3 Encounters:  12/28/15 133 lb (60.3 kg)  07/02/14 128 lb (58.1 kg)      Other studies Reviewed: Additional studies/ records that were reviewed today include: Outside records including labs.. Review of the above records demonstrates:  Please see elsewhere in the note.     ASSESSMENT AND PLAN:  PALPITATIONS:  I don't suspect that these are related to structural heart disease. They are rare. No change in therapy is indicated.  DOE:  There is some very mild exertion with significant activity. She will get a coronary calcium score. This will also evaluate for risk for future cardiovascular events given of note I did review her labs her lipid profile was excellent. No change in therapy or medical treatment necessary at this point.   Current medicines are reviewed at length with the patient today.  The patient does not have concerns regarding medicines.  The following changes have been made:  no change  Labs/ tests ordered today include:   Orders Placed This Encounter  Procedures  . CT CARDIAC SCORING  . EKG 12-Lead     Disposition:   FU with me as needed.     Signed, Minus Breeding, MD  12/28/2015 12:30 PM    [place]

## 2015-12-28 ENCOUNTER — Encounter: Payer: Self-pay | Admitting: Cardiology

## 2015-12-28 ENCOUNTER — Ambulatory Visit (INDEPENDENT_AMBULATORY_CARE_PROVIDER_SITE_OTHER): Payer: BLUE CROSS/BLUE SHIELD | Admitting: Cardiology

## 2015-12-28 VITALS — BP 100/66 | HR 79 | Ht 65.0 in | Wt 133.0 lb

## 2015-12-28 DIAGNOSIS — R002 Palpitations: Secondary | ICD-10-CM | POA: Diagnosis not present

## 2015-12-28 DIAGNOSIS — R0602 Shortness of breath: Secondary | ICD-10-CM

## 2015-12-28 NOTE — Patient Instructions (Signed)
Medication Instructions:  Continue current medications  Labwork: None Ordered  Testing/Procedures: Your physician has requested that you have a Coronary Calcium Score. This test is done at our [facility]: Your physician recommends that you schedule a follow-up appointment in: As Needed   Any Other Special Instructions Will Be Listed Below (If Applicable).         Happy Holiday  If you need a refill on your cardiac medications before your next appointment, please call your pharmacy.

## 2016-01-10 ENCOUNTER — Inpatient Hospital Stay: Admission: RE | Admit: 2016-01-10 | Payer: Self-pay | Source: Ambulatory Visit

## 2016-01-16 ENCOUNTER — Inpatient Hospital Stay: Admission: RE | Admit: 2016-01-16 | Payer: Self-pay | Source: Ambulatory Visit

## 2016-03-06 ENCOUNTER — Inpatient Hospital Stay: Admission: RE | Admit: 2016-03-06 | Payer: Self-pay | Source: Ambulatory Visit

## 2016-09-05 ENCOUNTER — Other Ambulatory Visit: Payer: Self-pay | Admitting: Gastroenterology

## 2016-09-05 DIAGNOSIS — R1012 Left upper quadrant pain: Secondary | ICD-10-CM

## 2016-09-05 DIAGNOSIS — R140 Abdominal distension (gaseous): Secondary | ICD-10-CM

## 2016-09-30 ENCOUNTER — Other Ambulatory Visit: Payer: Self-pay | Admitting: Gastroenterology

## 2016-09-30 ENCOUNTER — Ambulatory Visit
Admission: RE | Admit: 2016-09-30 | Discharge: 2016-09-30 | Disposition: A | Discharge status: Home / Self Care | Payer: BLUE CROSS/BLUE SHIELD | Source: Ambulatory Visit | Attending: Gastroenterology | Admitting: Gastroenterology

## 2016-09-30 DIAGNOSIS — R140 Abdominal distension (gaseous): Secondary | ICD-10-CM

## 2016-09-30 DIAGNOSIS — R1012 Left upper quadrant pain: Secondary | ICD-10-CM

## 2016-09-30 DIAGNOSIS — R935 Abnormal findings on diagnostic imaging of other abdominal regions, including retroperitoneum: Secondary | ICD-10-CM

## 2016-09-30 DIAGNOSIS — R932 Abnormal findings on diagnostic imaging of liver and biliary tract: Secondary | ICD-10-CM

## 2016-10-02 ENCOUNTER — Ambulatory Visit
Admission: RE | Admit: 2016-10-02 | Discharge: 2016-10-02 | Disposition: A | Discharge status: Home / Self Care | Payer: BLUE CROSS/BLUE SHIELD | Source: Ambulatory Visit | Attending: Gastroenterology | Admitting: Gastroenterology

## 2016-10-02 DIAGNOSIS — R932 Abnormal findings on diagnostic imaging of liver and biliary tract: Secondary | ICD-10-CM

## 2016-10-02 DIAGNOSIS — R935 Abnormal findings on diagnostic imaging of other abdominal regions, including retroperitoneum: Secondary | ICD-10-CM

## 2016-10-02 MED ORDER — GADOBENATE DIMEGLUMINE 529 MG/ML IV SOLN
10.0000 mL | Freq: Once | INTRAVENOUS | Status: AC | PRN
  Administered 2016-10-02: 10 mL via INTRAVENOUS

## 2016-10-04 ENCOUNTER — Other Ambulatory Visit (HOSPITAL_COMMUNITY): Payer: Self-pay | Admitting: Gastroenterology

## 2016-10-04 ENCOUNTER — Other Ambulatory Visit: Payer: Self-pay | Admitting: Radiology

## 2016-10-04 DIAGNOSIS — K769 Liver disease, unspecified: Secondary | ICD-10-CM

## 2016-10-07 ENCOUNTER — Encounter (HOSPITAL_COMMUNITY): Payer: Self-pay

## 2016-10-07 ENCOUNTER — Ambulatory Visit (HOSPITAL_COMMUNITY)
Admission: RE | Admit: 2016-10-07 | Discharge: 2016-10-07 | Disposition: A | Discharge status: Home / Self Care | Payer: BLUE CROSS/BLUE SHIELD | Source: Ambulatory Visit | Attending: Gastroenterology | Admitting: Gastroenterology

## 2016-10-07 DIAGNOSIS — R140 Abdominal distension (gaseous): Secondary | ICD-10-CM | POA: Diagnosis not present

## 2016-10-07 DIAGNOSIS — K769 Liver disease, unspecified: Secondary | ICD-10-CM

## 2016-10-07 DIAGNOSIS — Z885 Allergy status to narcotic agent status: Secondary | ICD-10-CM | POA: Diagnosis not present

## 2016-10-07 DIAGNOSIS — Z880 Allergy status to penicillin: Secondary | ICD-10-CM | POA: Diagnosis not present

## 2016-10-07 DIAGNOSIS — D509 Iron deficiency anemia, unspecified: Secondary | ICD-10-CM | POA: Diagnosis not present

## 2016-10-07 DIAGNOSIS — Z91018 Allergy to other foods: Secondary | ICD-10-CM | POA: Insufficient documentation

## 2016-10-07 DIAGNOSIS — Z91010 Allergy to peanuts: Secondary | ICD-10-CM | POA: Insufficient documentation

## 2016-10-07 DIAGNOSIS — Z801 Family history of malignant neoplasm of trachea, bronchus and lung: Secondary | ICD-10-CM | POA: Diagnosis not present

## 2016-10-07 DIAGNOSIS — E559 Vitamin D deficiency, unspecified: Secondary | ICD-10-CM | POA: Diagnosis not present

## 2016-10-07 DIAGNOSIS — Z800 Family history of malignant neoplasm of digestive organs: Secondary | ICD-10-CM | POA: Diagnosis not present

## 2016-10-07 DIAGNOSIS — R160 Hepatomegaly, not elsewhere classified: Secondary | ICD-10-CM | POA: Diagnosis not present

## 2016-10-07 DIAGNOSIS — E782 Mixed hyperlipidemia: Secondary | ICD-10-CM | POA: Insufficient documentation

## 2016-10-07 DIAGNOSIS — E8840 Mitochondrial metabolism disorder, unspecified: Secondary | ICD-10-CM | POA: Diagnosis not present

## 2016-10-07 LAB — CBC
HCT: 33.1 % — ABNORMAL LOW (ref 36.0–46.0)
HEMOGLOBIN: 9.5 g/dL — AB (ref 12.0–15.0)
MCH: 19.3 pg — ABNORMAL LOW (ref 26.0–34.0)
MCHC: 28.7 g/dL — AB (ref 30.0–36.0)
MCV: 67.4 fL — ABNORMAL LOW (ref 78.0–100.0)
PLATELETS: 259 10*3/uL (ref 150–400)
RBC: 4.91 MIL/uL (ref 3.87–5.11)
RDW: 17.5 % — AB (ref 11.5–15.5)
WBC: 4.1 10*3/uL (ref 4.0–10.5)

## 2016-10-07 LAB — PROTIME-INR
INR: 0.95
Prothrombin Time: 12.6 seconds (ref 11.4–15.2)

## 2016-10-07 LAB — APTT: aPTT: 33 seconds (ref 24–36)

## 2016-10-07 MED ORDER — MIDAZOLAM HCL 2 MG/2ML IJ SOLN
INTRAMUSCULAR | Status: AC | PRN
  Administered 2016-10-07: 2 mg via INTRAVENOUS

## 2016-10-07 MED ORDER — SODIUM CHLORIDE 0.9 % IV SOLN: INTRAVENOUS | Status: DC

## 2016-10-07 MED ORDER — GELATIN ABSORBABLE 12-7 MM EX MISC
CUTANEOUS | Status: AC
  Filled 2016-10-07: qty 1

## 2016-10-07 MED ORDER — MIDAZOLAM HCL 2 MG/2ML IJ SOLN
INTRAMUSCULAR | Status: AC
  Filled 2016-10-07: qty 2

## 2016-10-07 MED ORDER — FENTANYL CITRATE (PF) 100 MCG/2ML IJ SOLN
INTRAMUSCULAR | Status: AC | PRN
  Administered 2016-10-07 (×2): 50 ug via INTRAVENOUS

## 2016-10-07 MED ORDER — FENTANYL CITRATE (PF) 100 MCG/2ML IJ SOLN
INTRAMUSCULAR | Status: AC
  Filled 2016-10-07: qty 2

## 2016-10-07 MED ORDER — LIDOCAINE HCL (PF) 1 % IJ SOLN
INTRAMUSCULAR | Status: AC
  Filled 2016-10-07: qty 10

## 2016-10-07 NOTE — Sedation Documentation (Signed)
Patient is resting comfortably. 

## 2016-10-07 NOTE — Discharge Instructions (Addendum)
Liver Biopsy, Care After °Refer to this sheet in the next few weeks. These instructions provide you with information on caring for yourself after your procedure. Your health care provider may also give you more specific instructions. Your treatment has been planned according to current medical practices, but problems sometimes occur. Call your health care provider if you have any problems or questions after your procedure. °What can I expect after the procedure? °After your procedure, it is typical to have the following: °· A small amount of discomfort in the area where the biopsy was done and in the right shoulder or shoulder blade. °· A small amount of bruising around the area where the biopsy was done and on the skin over the liver. °· Sleepiness and fatigue for the rest of the day. ° °Follow these instructions at home: °· Rest at home for 1-2 days or as directed by your health care provider. °· Have a friend or family member stay with you for at least 24 hours. °· Because of the medicines used during the procedure, you should not do the following things in the first 24 hours: °? Drive. °? Use machinery. °? Be responsible for the care of other people. °? Sign legal documents. °? Take a bath or shower. °· There are many different ways to close and cover an incision, including stitches, skin glue, and adhesive strips. Follow your health care provider's instructions on: °? Incision care. °? Bandage (dressing) changes and removal. °? Incision closure removal. °· Do not drink alcohol in the first week. °· Do not lift more than 5 pounds or play contact sports for 2 weeks after this test. °· Take medicines only as directed by your health care provider. Do not take medicine containing aspirin or non-steroidal anti-inflammatory medicines such as ibuprofen for 1 week after this test. °· It is your responsibility to get your test results. °Contact a health care provider if: °· You have increased bleeding from an incision  that results in more than a small spot of blood. °· You have redness, swelling, or increasing pain in any incisions. °· You notice a discharge or a bad smell coming from any of your incisions. °· You have a fever or chills. °Get help right away if: °· You develop swelling, bloating, or pain in your abdomen. °· You become dizzy or faint. °· You develop a rash. °· You are nauseous or vomit. °· You have difficulty breathing, feel short of breath, or feel faint. °· You develop chest pain. °· You have problems with your speech or vision. °· You have trouble balancing or moving your arms or legs. °This information is not intended to replace advice given to you by your health care provider. Make sure you discuss any questions you have with your health care provider. °Document Released: 07/13/2004 Document Revised: 06/01/2015 Document Reviewed: 02/19/2013 °Elsevier Interactive Patient Education © 2018 [employer] °Moderate Conscious Sedation, Adult, Care After °These instructions provide you with information about caring for yourself after your procedure. Your health care provider may also give you more specific instructions. Your treatment has been planned according to current medical practices, but problems sometimes occur. Call your health care provider if you have any problems or questions after your procedure. °What can I expect after the procedure? °After your procedure, it is common: °· To feel sleepy for several hours. °· To feel clumsy and have poor balance for several hours. °· To have poor judgment for several hours. °· To vomit if you eat   too soon. ° °Follow these instructions at home: °For at least 24 hours after the procedure: ° °· Do not: °? Participate in activities where you could fall or become injured. °? Drive. °? Use heavy machinery. °? Drink alcohol. °? Take sleeping pills or medicines that cause drowsiness. °? Make important decisions or sign legal documents. °? Take care of children on your  own. °· Rest. °Eating and drinking °· Follow the diet recommended by your health care provider. °· If you vomit: °? Drink water, juice, or soup when you can drink without vomiting. °? Make sure you have little or no nausea before eating solid foods. °General instructions °· Have a responsible adult stay with you until you are awake and alert. °· Take over-the-counter and prescription medicines only as told by your health care provider. °· If you smoke, do not smoke without supervision. °· Keep all follow-up visits as told by your health care provider. This is important. °Contact a health care provider if: °· You keep feeling nauseous or you keep vomiting. °· You feel light-headed. °· You develop a rash. °· You have a fever. °Get help right away if: °· You have trouble breathing. °This information is not intended to replace advice given to you by your health care provider. Make sure you discuss any questions you have with your health care provider. °Document Released: 10/14/2012 Document Revised: 05/29/2015 Document Reviewed: 04/15/2015 °Elsevier Interactive Patient Education © 2018 [employer] Inc. ° °

## 2016-10-07 NOTE — Sedation Documentation (Signed)
Patient denies pain and is resting comfortably.  

## 2016-10-07 NOTE — H&P (Signed)
Chief Complaint: liver masses  Referring Physician:Dr. Wilford Corner  Supervising Physician: Marybelle Killings  Patient Status: Brittney [name] - Out-pt  HPI: Brittney [name] is a 51 y.o. female who has been having abdominal bloating for the last 2 months.  She is having some left sided abdominal discomfort.  This is not associated with diet.  She denies nausea or vomiting.  She denies any weight loss or any other symptoms, such as CP, SOB, fevers, chills, HAs, etc.  She had an [place] last Monday that was concerning for masses.  She underwent an MRI that revealed multiple hepatic masses concerning for metastatic disease.  She was referred today for a liver biopsy.  Past Medical History:  Past Medical History:  Diagnosis Date  . 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 Family history of sudden cardiac death   . Heart palpitations   . Iron deficiency anemia   . Magnesium deficiency   . 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 Mixed hyperlipidemia   . Premenstrual tension syndrome   . Vitamin D deficiency     Past Surgical History:  Past Surgical History:  Procedure Laterality Date  . CESAREAN SECTION      Family History:  Family History  Problem Relation Age of Onset  . Sudden Cardiac Death Cousin 77       Female (maternal)  . Sudden Cardiac Death Cousin 54       Female (paternal)   . Cancer Mother        colon  . Cancer Father        lung    Social History:  reports that she has never smoked. She has never used smokeless tobacco. She reports that she drinks alcohol. She reports that she does not use drugs.  Allergies:  Allergies  Allergen Reactions  . Other Anaphylaxis and Hives    Tree nuts Peanuts  . Trilyte [Peg 3350-Kcl-Na Bicarb-Nacl] Other (See Comments)    Throwing up  . Penicillins Other (See Comments)    "childhood allergy"  . Percocet [Oxycodone-Acetaminophen] Nausea And Vomiting    Medications: Medications reviewed in epic  Please HPI for  pertinent positives, otherwise complete 10 system ROS negative.  Mallampati Score: MD Evaluation Airway: WNL Heart: WNL Abdomen: WNL Chest/ Lungs: WNL ASA  Classification: 2 Mallampati/Airway Score: Two  Physical Exam: BP 108/78   Pulse 69   Temp 98.3 F (36.8 C) (Oral)   Resp 16   Ht 5\' 5"  (1.651 m)   Wt 128 lb (58.1 kg)   LMP 01/31/2014   SpO2 100%   BMI 21.30 kg/m  Body mass index is 21.3 kg/m. General: pleasant, WD, WN white female who is laying in bed in NAD HEENT: head is normocephalic, atraumatic.  Sclera are noninjected.  PERRL.  Ears and nose without any masses or lesions.  Mouth is pink and moist Heart: regular, rate, and rhythm.  Normal s1,s2. No obvious murmurs, gallops, or rubs noted.  Palpable radial pulses bilaterally Lungs: CTAB, no wheezes, rhonchi, or rales noted.  Respiratory effort nonlabored Abd: soft, minimally tender on left side, ND, +BS, no masses, hernias, or organomegaly Psych: A&Ox3 with an appropriate affect.   Labs: Results for orders placed or performed during the hospital encounter of 10/07/16 (from the past 48 hour(s))  APTT upon arrival     Status: None   Collection Time: 10/07/16  6:10 AM  Result Value Ref Range   aPTT 33 24 - 36 seconds  CBC upon arrival  Status: Abnormal   Collection Time: 10/07/16  6:10 AM  Result Value Ref Range   WBC 4.1 4.0 - 10.5 K/uL   RBC 4.91 3.87 - 5.11 MIL/uL   Hemoglobin 9.5 (L) 12.0 - 15.0 g/dL   HCT 33.1 (L) 36.0 - 46.0 %   MCV 67.4 (L) 78.0 - 100.0 fL   MCH 19.3 (L) 26.0 - 34.0 pg   MCHC 28.7 (L) 30.0 - 36.0 g/dL   RDW 17.5 (H) 11.5 - 15.5 %   Platelets 259 150 - 400 K/uL  Protime-INR upon arrival     Status: None   Collection Time: 10/07/16  6:10 AM  Result Value Ref Range   Prothrombin Time 12.6 11.4 - 15.2 seconds   INR 0.95     Imaging: No results found.  Assessment/Plan 1. Liver lesions  We will plan to pursue liver biopsy this morning to help determine etiology of these lesions.   Her labs and vitals have been reviewed.  Risks and benefits discussed with the patient including, but not limited to bleeding, infection, damage to adjacent structures or low yield requiring additional tests. All of the patient's questions were answered, patient is agreeable to proceed. Consent signed and in chart.   Thank you for this interesting consult.  I greatly enjoyed meeting Brittney [name] and look forward to participating in their care.  A copy of this report was sent to the requesting provider on this date.  Electronically Signed: Henreitta Cea 10/07/2016, 7:49 AM   I spent a total of  30 Minutes   in face to face in clinical consultation, greater than 50% of which was counseling/coordinating care for liver lesions

## 2016-10-07 NOTE — Procedures (Signed)
L lobe liver lesion Bx 18 g times three Comp 0 EBL 0

## 2016-10-09 ENCOUNTER — Ambulatory Visit (HOSPITAL_COMMUNITY): Payer: BLUE CROSS/BLUE SHIELD

## 2016-10-09 ENCOUNTER — Other Ambulatory Visit: Payer: Self-pay

## 2016-10-15 ENCOUNTER — Telehealth: Payer: Self-pay

## 2016-10-15 ENCOUNTER — Ambulatory Visit (HOSPITAL_BASED_OUTPATIENT_CLINIC_OR_DEPARTMENT_OTHER): Payer: BLUE CROSS/BLUE SHIELD

## 2016-10-15 ENCOUNTER — Ambulatory Visit (HOSPITAL_BASED_OUTPATIENT_CLINIC_OR_DEPARTMENT_OTHER): Payer: BLUE CROSS/BLUE SHIELD | Admitting: Oncology

## 2016-10-15 VITALS — BP 104/63 | HR 74 | Temp 98.5°F | Resp 18 | Ht 65.0 in | Wt 131.3 lb

## 2016-10-15 DIAGNOSIS — R109 Unspecified abdominal pain: Secondary | ICD-10-CM | POA: Diagnosis not present

## 2016-10-15 DIAGNOSIS — C7B8 Other secondary neuroendocrine tumors: Secondary | ICD-10-CM | POA: Diagnosis not present

## 2016-10-15 DIAGNOSIS — D509 Iron deficiency anemia, unspecified: Secondary | ICD-10-CM

## 2016-10-15 DIAGNOSIS — C7A8 Other malignant neuroendocrine tumors: Secondary | ICD-10-CM

## 2016-10-15 NOTE — Progress Notes (Signed)
[facility] Patient Consult   Referring MD: Arliene Rosenow 51 y.o.  Jun 01, 1965    Reason for Referral: Metastatic neuroendocrine tumor   HPI: Ms. Drew reports a intermittent episodes of abdominal "bloating" for the past 6 months. She reports she feels suddenly as if she cannot button her hands. She has intermittent episodes of left abdominal discomfort. No other symptoms. She was referred to Dr. Michail Sermon. A pelvic ultrasound on 09/30/2016 was unremarkable. An abdominal ultrasound the same day revealed multiple solid/cystic masses in the liver concerning for metastatic disease. An MRI of the abdomen on 10/02/2016 confirmed multiple enhancing liver lesions most consistent with metastases. 2 lesions in the right liver were favored to represent hemangiomas. No evidence for a primary tumor site.  She was referred for GUIDED BIOPSY OF A LEFT LIVER LESION ON 10/07/2016. THE PATHOLOGY [phone]) revealed a neuroendocrine neoplasm, "intermediate grade ". 3 mitoses were identified per 50 high-power fields. No necrosis. The tumor cells were positive for synaptophysin, chromogranin, cytokeratin AE1 3, and CDX2. The tumor cells were negative for cytokeratin 7 and TTF-1. The immunohistochemical staining pattern is felt to be consistent with a GI primary.    Past Medical History:  Diagnosis Date  . 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 Family history of sudden cardiac death   . Heart palpitations   . Iron deficiency anemia-Reports being treated with IV iron 2 years ago    . Magnesium deficiency   . 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 Mixed hyperlipidemia   . Premenstrual tension syndrome   . Vitamin D deficiency     .  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 CESAREAN SECTION      Medications: Reviewed  Allergies:  Allergies  Allergen Reactions  . Other  Anaphylaxis and Hives    Tree nuts Peanuts  . Trilyte [Peg 3350-Kcl-Na Bicarb-Nacl] Other (See Comments)    Throwing up  . Penicillins Other (See Comments)    "childhood allergy"  . Percocet [Oxycodone-Acetaminophen] Nausea And Vomiting    Family history: Her mother died of colon cancer at age 19 Her father had lung cancer and was a smoker, her brother had thyroid cancer. 3 paternal cousins had breast cancer.  Social History:   She lives with her husband and [place]. She works in [employer], [employer]. She does not use cigarettes. She has 1 glass of wine daily.   ROS:   Positives include: Intermittent abdominal bloating, intermittent episodes of left abdominal firmness and "discomfort ", intermittent rash over the forearms for the past 18 months-evaluated by dermatology  A complete ROS was otherwise negative.  Physical Exam:  Blood pressure 104/63, pulse 74, temperature 98.5 F (36.9 C), temperature source Oral, resp. rate 18, height 5\' 5"  (1.651 m), weight 131 lb 4.8 oz (59.6 kg), last menstrual period 01/31/2014, SpO2 100 %.  HEENT: Oropharynx without visible mass, neck without mass Lungs: Clear bilaterally Cardiac: Regular rate and rhythm Abdomen: No hepatosplenomegaly, no mass, mild tenderness in the left abdomen  Vascular: No leg edema Lymph nodes: No cervical, supraclavicular, or inguinal nodes. "Shotty "bilateral axillary nodes Neurologic: Alert and oriented, the motor exam appears intact in the upper and lower extremities Skin: No rash Musculoskeletal: No spine tenderness   LAB: 10/03/2016: CEA 2.9, AFP 2.1, CA 19-9 11  CBC  Lab Results  Component Value Date   WBC 4.1 10/07/2016   HGB  9.5 (L) 10/07/2016   HCT 33.1 (L) 10/07/2016   MCV 67.4 (L) 10/07/2016   PLT 259 10/07/2016   NEUTROABS 5.9 07/21/2014       Images: MRI abdomen 10/02/2016-images reviewed    Assessment/Plan:   1. Metastatic neuroendocrine tumor  MRI of the abdomen 10/02/2016  confirmed multiple enhancing liver lesions consistent with metastases, no primary tumor site identified  Ultrasound-guided biopsy of a left liver lesion 10/07/2016-neuroendocrine neoplasm, "intermediate grade "  2.  Microcytic anemia  3.  Intermittent abdominal bloating and left-sided abdominal pain   Disposition:   Ms. Stay has been diagnosed with a metastatic neuroendocrine tumor involving the liver. An MRI of the abdomen confirmed multiple liver metastases and no primary tumor site.  I discussed the diagnosis of a metastatic neuroendocrine tumor, the probable primary tumor sites, and treatment options with Ms. Wahlberg and her husband. I am suspicious of a gastrointestinal primary based on the tumor histology, her symptoms, and the microcytic anemia. A small bowel tumor is most likely.  I discussed the pathology with Dr. Orene Desanctis. She indicates the tumor appears to be a "carcinoid "tumor.  We checked a chromogranin A level today. Ms. Bartus will be referred for a diagnostic DOTATATE scan. The scan will look for evidence of DOTATATE uptake in anticipation of DOTATATE therapy; and a primary tumor site.  Ms. Ertl will return for an office visit after the diagnostic scan.  I will present her case at the GI tumor conference.  50 minutes were spent with the patient today. The majority of the time was used for counseling and coordination of care.  Donneta Romberg, MD  10/15/2016, 3:37 PM

## 2016-10-15 NOTE — Telephone Encounter (Signed)
Printed patient avs and calender. Waiting for Radiology to scheduled. perr 10/9 los

## 2016-10-15 NOTE — Telephone Encounter (Signed)
Sent request for authorization for patient. Per 10/9 los

## 2016-10-16 ENCOUNTER — Ambulatory Visit: Payer: Self-pay | Admitting: Oncology

## 2016-10-17 ENCOUNTER — Ambulatory Visit: Payer: Self-pay | Admitting: Oncology

## 2016-10-17 LAB — CHROMOGRANIN A: CHROMOGRAN A: 10 nmol/L — AB (ref 0–5)

## 2016-10-17 LAB — FERRITIN: Ferritin: 4 ng/ml — ABNORMAL LOW (ref 9–269)

## 2016-10-18 ENCOUNTER — Telehealth: Payer: Self-pay

## 2016-10-18 NOTE — Telephone Encounter (Signed)
Scan is now scheduled for 10/17 at 10 am.   Will she need a lab appt about 1 hour prior? She is blocking off calendar at 8 to be able to come for lab.

## 2016-10-18 NOTE — Telephone Encounter (Signed)
Pt called that her Dotatate scan was scheduled for Nov 14th as 1st available. Pt is aware the target date was week of 10/22. What should be done?

## 2016-10-22 ENCOUNTER — Telehealth: Payer: Self-pay

## 2016-10-22 NOTE — Telephone Encounter (Signed)
Pt called asking if she needs to be NPO for scan tomorrow. Called pt back and she had called radiology in the interim and they told her npo for 2 hours. It is a dotatate scan.

## 2016-10-23 ENCOUNTER — Telehealth: Payer: Self-pay | Admitting: *Deleted

## 2016-10-23 ENCOUNTER — Encounter (HOSPITAL_COMMUNITY)
Admission: RE | Admit: 2016-10-23 | Discharge: 2016-10-23 | Disposition: A | Discharge status: Home / Self Care | Payer: BLUE CROSS/BLUE SHIELD | Source: Ambulatory Visit | Attending: Oncology | Admitting: Oncology

## 2016-10-23 ENCOUNTER — Other Ambulatory Visit: Payer: BLUE CROSS/BLUE SHIELD

## 2016-10-23 ENCOUNTER — Telehealth: Payer: Self-pay | Admitting: Emergency Medicine

## 2016-10-23 DIAGNOSIS — C7B8 Other secondary neuroendocrine tumors: Secondary | ICD-10-CM

## 2016-10-23 DIAGNOSIS — D509 Iron deficiency anemia, unspecified: Secondary | ICD-10-CM | POA: Diagnosis present

## 2016-10-23 MED ORDER — GALLIUM GA 68 DOTATATE IV KIT
3.2000 | PACK | Freq: Once | INTRAVENOUS | Status: AC
  Administered 2016-10-23: 3.2 via INTRAVENOUS

## 2016-10-23 NOTE — Progress Notes (Signed)
Navigation Note:  I called patient and left VM to introduce myself and my role as GI Navigator and to give patient my contact information. I encouraged patient to call with questions or concerns. I plan to meet patient during her med/onc appointment on 10/28/16.

## 2016-10-23 NOTE — Telephone Encounter (Signed)
-----   Message from Ladell Pier, MD sent at 10/17/2016  1:06 PM EDT ----- Please call patient, iron is low, start ferrous sulfate 325mg  bid, repeat cbc next visit  chromogranin is pending

## 2016-10-23 NOTE — Telephone Encounter (Signed)
Confirmed with nuclear med and Dr. Benay Spice, no lab needed for dotatate scan. Spoke with pt and husband in the lobby, both voiced understanding.

## 2016-10-23 NOTE — Telephone Encounter (Signed)
-----   Message from Ladell Pier, MD sent at 10/17/2016  1:57 PM EDT ----- Please call patient, chromogranin A is mildly elevated as expected

## 2016-10-23 NOTE — Telephone Encounter (Signed)
Left detailed message on voicemail instructing pt to begin ferrous sulfate 325 mg BID. Left chromagranin result as well.

## 2016-10-24 ENCOUNTER — Telehealth: Payer: Self-pay | Admitting: *Deleted

## 2016-10-24 DIAGNOSIS — C7B8 Other secondary neuroendocrine tumors: Secondary | ICD-10-CM

## 2016-10-24 NOTE — Addendum Note (Signed)
Addended by: Brien Few on: 10/24/2016 05:18 PM   Modules accepted: Orders

## 2016-10-24 NOTE — Progress Notes (Signed)
  Oncology Nurse Navigator Documentation  Navigator Location: [facility] (10/24/16 5320)   )Navigator Encounter Type: Telephone (10/24/16 1535) Telephone: Incoming Call (10/24/16 1535)                     Treatment Phase: Pre-Tx/Tx Discussion (10/24/16 1535) Barriers/Navigation Needs:  (Patient returned my call from 10/23/16.) (10/24/16 1535)   Interventions: Psycho-social support;Other (Answered questions related to treatment team.) (10/24/16 1535)  Patient returned my call from 10/23/16. I spoke with both patient and husband. Athelene had received a call from Dr. Gearldine Shown nurse with results from her chromogranin study earlier in the day and was unable get back in touch with desk nurse.  Patient had additional questions. I left message for Lavella Lemons RN to call patient back. I  Will meet with patient and husband during their visit with Dr. Benay Spice on 10/28/16.          Acuity: Level 2 (10/24/16 1535)   Acuity Level 2: Ongoing guidance and education throughout treatment as needed;Educational needs;Initial guidance, education and coordination as needed (10/24/16 1535)     Time Spent with Patient: 30 (10/24/16 1535)

## 2016-10-24 NOTE — Telephone Encounter (Signed)
Pt returned call, informed her that the Netspot scan showed uptake in the liver as expected. Appears to have a small bowel primary and abdominal lymph nodes. Dr. Benay Spice will discuss referral to surgeon at 10/22 visit. Pt and husband had several questions, asked if we can arrange for consultation with surgeon since it may take a while to get in. Will review with MD.

## 2016-10-24 NOTE — Telephone Encounter (Signed)
Received verbal order for referral to Dr. Barry Dienes. Order entered, will make Dawn, GI navigator aware.

## 2016-10-25 ENCOUNTER — Telehealth: Payer: Self-pay | Admitting: Oncology

## 2016-10-25 NOTE — Telephone Encounter (Signed)
Scheduled appt per 10/17 sch message - patient is aware of appt date and time.

## 2016-10-25 NOTE — Telephone Encounter (Signed)
Patient scheduled with Dr. Barry Dienes on 11/04/16 @ 4:30 PM.

## 2016-10-25 NOTE — Progress Notes (Signed)
  Oncology Nurse Navigator Documentation  Navigator Location: [name] (10/25/16 2182)   )Navigator Encounter Type: Telephone (10/25/16 8833) Telephone: Outgoing Call (Call to [facility] to obtain apt time and date) (10/25/16 7445)                     Treatment Phase: Pre-Tx/Tx Discussion;Abnormal Scans (10/25/16 1460) Barriers/Navigation Needs: Coordination of Care (10/25/16 4799)   Interventions: Coordination of Care;Referrals (10/25/16 8721) Referrals: Other (Surgery) (10/25/16 5872) Coordination of Care: Appts (10/25/16 7618)   Called and left VM with referral coordinator at [facility] to f/u on referral to Dr. Barry Dienes requesting  Apt. Time and date.     Acuity: Level 2 (10/25/16 0852)   Acuity Level 2: Assistance expediting appointments (10/25/16 4859)     Time Spent with Patient: 15 (10/25/16 2763)

## 2016-10-25 NOTE — Progress Notes (Signed)
  Oncology Nurse Navigator Documentation  Navigator Location: [facility] (10/25/16 1026)   )Navigator Encounter Type: Letter/Fax/Email (10/25/16 1026)                             Interventions: Referrals (10/25/16 1026) Referrals: Other (Surgery) (10/25/16 1026) Coordination of Care: Appts (Surgery) (10/25/16 1026)     I spoke with Sarah at [facility] regarding referral to Dr. Barry Dienes. Pertinent information faxed to Sarah @ [phone].   Acuity: Level 2 (10/25/16 1026)   Acuity Level 2: Referrals such as genetics, survivorship (Surgical refferal) (10/25/16 1026)     Time Spent with Patient: 15 (10/25/16 1026)

## 2016-10-28 ENCOUNTER — Telehealth: Payer: Self-pay | Admitting: Oncology

## 2016-10-28 ENCOUNTER — Other Ambulatory Visit: Payer: BLUE CROSS/BLUE SHIELD

## 2016-10-28 ENCOUNTER — Other Ambulatory Visit: Payer: Self-pay

## 2016-10-28 ENCOUNTER — Ambulatory Visit (HOSPITAL_BASED_OUTPATIENT_CLINIC_OR_DEPARTMENT_OTHER): Payer: BLUE CROSS/BLUE SHIELD

## 2016-10-28 ENCOUNTER — Ambulatory Visit (HOSPITAL_BASED_OUTPATIENT_CLINIC_OR_DEPARTMENT_OTHER): Payer: BLUE CROSS/BLUE SHIELD | Admitting: Oncology

## 2016-10-28 VITALS — BP 114/65 | HR 80 | Temp 97.8°F | Resp 18 | Ht 65.0 in | Wt 131.0 lb

## 2016-10-28 DIAGNOSIS — C7B02 Secondary carcinoid tumors of liver: Secondary | ICD-10-CM

## 2016-10-28 DIAGNOSIS — R300 Dysuria: Secondary | ICD-10-CM

## 2016-10-28 DIAGNOSIS — D509 Iron deficiency anemia, unspecified: Secondary | ICD-10-CM

## 2016-10-28 DIAGNOSIS — C7A019 Malignant carcinoid tumor of the small intestine, unspecified portion: Secondary | ICD-10-CM

## 2016-10-28 LAB — URINALYSIS, MICROSCOPIC - CHCC
BILIRUBIN (URINE): NEGATIVE
BLOOD: NEGATIVE
GLUCOSE UR CHCC: NEGATIVE mg/dL
KETONES: NEGATIVE mg/dL
Leukocyte Esterase: NEGATIVE
Nitrite: NEGATIVE
PH: 6 (ref 4.6–8.0)
PROTEIN: NEGATIVE mg/dL
SPECIFIC GRAVITY, URINE: 1.01 (ref 1.003–1.035)
Urobilinogen, UR: 0.2 mg/dL (ref 0.2–1)

## 2016-10-28 MED ORDER — LORAZEPAM 0.5 MG PO TABS: 0.5000 mg | ORAL_TABLET | Freq: Every evening | ORAL | 2 refills | Status: DC | PRN

## 2016-10-28 MED ORDER — CIPROFLOXACIN HCL 500 MG PO TABS: 500.0000 mg | ORAL_TABLET | Freq: Two times a day (BID) | ORAL | 0 refills | Status: DC

## 2016-10-28 NOTE — Telephone Encounter (Signed)
Gave avs and calendar for November  °

## 2016-10-28 NOTE — Progress Notes (Signed)
  [facility] OFFICE PROGRESS NOTE   Diagnosis: Carcinoid tumor  INTERVAL HISTORY:   Brittney [name] returns as scheduled. She continues to have episodes of aching discomfort in the left abdomen. He is not taking pain medication. She is having bowel movements. She had one episode of nausea, but no emesis. She was diagnosed with urinary tract infection while at the beach last weekend. She was prescribed Macrobid, but the burning and frequency has not improved. She also developed a "yeast "infection treated with an over-the-counter antifungal. This has also persisted.  Objective:  Vital signs in last 24 hours:  Blood pressure 114/65, pulse 80, temperature 97.8 F (36.6 C), temperature source Oral, resp. rate 18, height 5\' 5"  (1.651 m), weight 131 lb (59.4 kg), last menstrual period 01/31/2014, SpO2 100 %.     GI: Abdomen soft, no mass, nontender   Lab Results:  Lab Results  Component Value Date   WBC 4.1 10/07/2016   HGB 9.5 (L) 10/07/2016   HCT 33.1 (L) 10/07/2016   MCV 67.4 (L) 10/07/2016   PLT 259 10/07/2016   NEUTROABS 5.9 07/21/2014   Ferritin on 10/16/1998 18-4 Chromogranin A on 10/15/2016-10 Imaging: PET scan images from 10/23/2016-reviewed   Medications: I have reviewed the patient's current medications.  Assessment/Plan: 1. Metastatic neuroendocrine tumor ? MRI of the abdomen 10/02/2016 confirmed multiple enhancing liver lesions consistent with metastases, no primary tumor site identified ? Ultrasound-guided biopsy of a left liver lesion 10/07/2016-neuroendocrine neoplasm, "intermediate grade ", review of pathology at GI tumor conference consistent with a low-grade carcinoid tumor ? Elevated chromogranin A 10/15/2016 ?  gallium-DOTATATE scan 10/23/2016-multiple foci of liver metastases, enlarged central mesenteric lymph nodes, short intussusception's in adjacent small bowel felt to represent a primary small bowel tumor, additional mesenteric lymph nodes  with metabolic activity, deep right pelvic nodule consistent with a pelvic metastasis  2.  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  Intermittent abdominal bloating and left-sided abdominal pain  4. Dysuria-ciprofloxacin prescribed 10/28/2016, urinalysis/culture obtained    Disposition:  Brittney [name] appears unchanged. I reviewed the nuclear medicine study images with Brittney [name] and Brittney [name]. 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 She is symptomatic with intermittent abdominal pain and bloating. I suspect she is experiencing intermittent obstructive symptoms.  I discussed the case with Brittney [name]. She will see Brittney [name] to consider surgical exploration and resection of the small bowel primary as indicated.  We discussed the potential for treatment with Lutathera at the completion of radiation.  She was given a prescription for ciprofloxacin. We will follow-up on urinalysis and urine culture from today.  She requested a sleep medication. We prescribed Ativan.  Brittney [name] will return for an office visit in approximately one month.  25 minutes were spent with the patient today. The majority of the time was used for counseling and coordination of care. Brittney Romberg, MD  10/28/2016  5:15 PM

## 2016-10-29 ENCOUNTER — Telehealth: Payer: Self-pay | Admitting: *Deleted

## 2016-10-29 LAB — URINE CULTURE: Organism ID, Bacteria: NO GROWTH

## 2016-10-29 NOTE — Telephone Encounter (Signed)
-----   Message from Ladell Pier, MD sent at 10/28/2016  5:40 PM EDT ----- Please call patient, urinalysis looks okay, no evidence of a urinary infection, continue ciprofloxacin, we will follow-up culture  Suggest she see her gynecologist if the symptoms persist

## 2016-10-29 NOTE — Telephone Encounter (Signed)
Called pt with UA result per MD note below. She voiced understanding. Pt reports she feels a little better today, will complete Cipro.

## 2016-11-04 ENCOUNTER — Other Ambulatory Visit: Payer: Self-pay | Admitting: General Surgery

## 2016-11-05 NOTE — Progress Notes (Signed)
  Oncology Nurse Navigator Documentation  Navigator Location: [facility] (11/05/16 1002)   )Navigator Encounter Type: Telephone (11/05/16 1002) Telephone: Incoming Call (11/05/16 1002)                     Treatment Phase: Pre-Tx/Tx Discussion (11/05/16 1002) Barriers/Navigation Needs: Education (11/05/16 1002)   Interventions: Psycho-social support (11/05/16 1002)  Patient spent time verbalizing how she was coping with her diagnosis as well as the comments that friends and acquaintances make related to her diagnosis. She states that people have made negative comments that are not helpful. Strategies for communicating with others explored. Patient states that she would like to get a tattoo "Faithlift", on her arm to remind her that she is being lifted up by others. Patient called to ask if she should get a prior to her surgery. After we spoke I spoke with Cira Rue NP and called patient back to share that it might be best if she waited on getting the tattoo until after surgery and her f/u surgical appointment.          Acuity: Level 2 (11/05/16 1002)   Acuity Level 2: Ongoing guidance and education throughout treatment as needed;Educational needs (11/05/16 1002)     Time Spent with Patient: 30 (11/05/16 1002)

## 2016-11-06 ENCOUNTER — Ambulatory Visit: Payer: Self-pay | Admitting: Oncology

## 2016-11-14 NOTE — Pre-Procedure Instructions (Addendum)
Brittney [name]  11/14/2016      [id number] - [place], [place] - [address] [place] [address] Phone: [phone] Fax: [phone]    Your procedure is scheduled on November 15  Report to [facility] at [facility] [place].M.  Call this number if you have problems the morning of surgery:  [phone]   Remember:  Do not eat food or drink liquids after midnight.  Continue all other medications as directed by your physician except follow these medication instructions before surgery  Please complete your 8oz of Boost Breeze or Water that was given to you at your preadmission appointment by 0330am on the DAY OF YOUR SURGERY    Take these medicines the morning of surgery with A SIP OF WATER  LORazepam (ATIVAN)  7 days prior to surgery STOP taking any Aspirin (unless otherwise instructed by your surgeon), Aleve, Naproxen, Ibuprofen, Motrin, Advil, Goody's, BC's, all herbal medications, fish oil, and all vitamins   Do not wear jewelry, make-up or nail polish.  Do not wear lotions, powders, or perfumes, or deoderant.  Do not shave 48 hours prior to surgery.    Do not bring valuables to the hospital.  [facility] is not responsible for any belongings or valuables.  Contacts, dentures or bridgework may not be worn into surgery.  Leave your suitcase in the car.  After surgery it may be brought to your room.  For patients admitted to the hospital, discharge time will be determined by your treatment team.  Patients discharged the day of surgery will not be allowed to drive home.    Special instructions:   [facility]- Preparing For Surgery  Before surgery, you can play an important role. Because skin is not sterile, your skin needs to be as free of germs as possible. You can reduce the number of germs on your skin by washing with CHG (chlorahexidine gluconate) Soap before surgery.  CHG is an antiseptic cleaner which kills germs and bonds  with the skin to continue killing germs even after washing.  Please do not use if you have an allergy to CHG or antibacterial soaps. If your skin becomes reddened/irritated stop using the CHG.  Do not shave (including legs and underarms) for at least 48 hours prior to first CHG shower. It is OK to shave your face.  Please follow these instructions carefully.   1. Shower the NIGHT BEFORE SURGERY and the MORNING OF SURGERY with CHG.   2. If you chose to wash your hair, wash your hair first as usual with your normal shampoo.  3. After you shampoo, rinse your hair and body thoroughly to remove the shampoo.  4. Use CHG as you would any other liquid soap. You can apply CHG directly to the skin and wash gently with a scrungie or a clean washcloth.   5. Apply the CHG Soap to your body ONLY FROM THE NECK DOWN.  Do not use on open wounds or open sores. Avoid contact with your eyes, ears, mouth and genitals (private parts). Wash Face and genitals (private parts)  with your normal soap.  6. Wash thoroughly, paying special attention to the area where your surgery will be performed.  7. Thoroughly rinse your body with warm water from the neck down.  8. DO NOT shower/wash with your normal soap after using and rinsing off the CHG Soap.  9. Pat yourself dry with a CLEAN TOWEL.  10. Wear CLEAN PAJAMAS  to bed the night before surgery, wear comfortable clothes the morning of surgery  11. Place CLEAN SHEETS on your bed the night of your first shower and DO NOT SLEEP WITH PETS.    Day of Surgery: Do not apply any deodorants/lotions. Please wear clean clothes to the hospital/surgery center.      Please read over the following fact sheets that you were given.

## 2016-11-15 ENCOUNTER — Encounter (HOSPITAL_COMMUNITY)
Admission: RE | Admit: 2016-11-15 | Discharge: 2016-11-15 | Disposition: A | Discharge status: Home / Self Care | Payer: BLUE CROSS/BLUE SHIELD | Source: Ambulatory Visit | Attending: General Surgery | Admitting: General Surgery

## 2016-11-15 ENCOUNTER — Other Ambulatory Visit: Payer: Self-pay

## 2016-11-15 ENCOUNTER — Encounter (HOSPITAL_COMMUNITY): Payer: Self-pay

## 2016-11-15 ENCOUNTER — Other Ambulatory Visit: Payer: Self-pay | Admitting: General Surgery

## 2016-11-15 DIAGNOSIS — Z01818 Encounter for other preprocedural examination: Secondary | ICD-10-CM | POA: Diagnosis not present

## 2016-11-15 DIAGNOSIS — C7A019 Malignant carcinoid tumor of the small intestine, unspecified portion: Secondary | ICD-10-CM | POA: Diagnosis not present

## 2016-11-15 LAB — COMPREHENSIVE METABOLIC PANEL
ALT: 16 U/L (ref 14–54)
AST: 26 U/L (ref 15–41)
Albumin: 3.5 g/dL (ref 3.5–5.0)
Alkaline Phosphatase: 56 U/L (ref 38–126)
Anion gap: 6 (ref 5–15)
BUN: 9 mg/dL (ref 6–20)
CHLORIDE: 106 mmol/L (ref 101–111)
CO2: 25 mmol/L (ref 22–32)
CREATININE: 0.72 mg/dL (ref 0.44–1.00)
Calcium: 8.6 mg/dL — ABNORMAL LOW (ref 8.9–10.3)
GFR calc Af Amer: >60 mL/min (ref >60)
GFR calc non Af Amer: >60 mL/min (ref >60)
Glucose, Bld: 94 mg/dL (ref 65–99)
Potassium: 4 mmol/L (ref 3.5–5.1)
SODIUM: 137 mmol/L (ref 135–145)
Total Bilirubin: 0.7 mg/dL (ref 0.3–1.2)
Total Protein: 6.2 g/dL — ABNORMAL LOW (ref 6.5–8.1)

## 2016-11-15 LAB — URINALYSIS, ROUTINE W REFLEX MICROSCOPIC
Bilirubin Urine: NEGATIVE
GLUCOSE, UA: NEGATIVE mg/dL
HGB URINE DIPSTICK: NEGATIVE
Ketones, ur: NEGATIVE mg/dL
Leukocytes, UA: NEGATIVE
Nitrite: NEGATIVE
PROTEIN: NEGATIVE mg/dL
Specific Gravity, Urine: 1.003 — ABNORMAL LOW (ref 1.005–1.030)
pH: 7 (ref 5.0–8.0)

## 2016-11-15 LAB — CBC WITH DIFFERENTIAL/PLATELET
BASOS ABS: 0 10*3/uL (ref 0.0–0.1)
BASOS PCT: 1 %
EOS PCT: 1 %
Eosinophils Absolute: 0 10*3/uL (ref 0.0–0.7)
HCT: 33 % — ABNORMAL LOW (ref 36.0–46.0)
Hemoglobin: 9.4 g/dL — ABNORMAL LOW (ref 12.0–15.0)
LYMPHS ABS: 1.8 10*3/uL (ref 0.7–4.0)
Lymphocytes Relative: 38 %
MCH: 20 pg — ABNORMAL LOW (ref 26.0–34.0)
MCHC: 28.5 g/dL — ABNORMAL LOW (ref 30.0–36.0)
MCV: 70.4 fL — ABNORMAL LOW (ref 78.0–100.0)
MONO ABS: 0.4 10*3/uL (ref 0.1–1.0)
Monocytes Relative: 9 %
NEUTROS ABS: 2.6 10*3/uL (ref 1.7–7.7)
Neutrophils Relative %: 51 %
PLATELETS: 318 10*3/uL (ref 150–400)
RBC: 4.69 MIL/uL (ref 3.87–5.11)
RDW: 19.9 % — ABNORMAL HIGH (ref 11.5–15.5)
WBC: 4.8 10*3/uL (ref 4.0–10.5)

## 2016-11-15 LAB — MRSA PCR SCREENING: MRSA by PCR: NEGATIVE

## 2016-11-15 NOTE — Progress Notes (Signed)
Dr. Chuck Hint office called about bowel prep.  There were no orders for a bowel prep and I wanted to make sure that the patient did not need a prep.  Dr. Lana Fish office will call patient if she needs the bowel prep.  Bowel prep education and instructions were given to the patient

## 2016-11-15 NOTE — Progress Notes (Signed)
PCP - [facility] Cardiologist - denies  Chest x-ray - not needed EKG - 12/27/16 Stress Test - denies ECHO - denies Cardiac Cath - denies   Patient denies shortness of breath, fever, cough and chest pain at PAT appointment   Patient verbalized understanding of instructions that were given to them at the PAT appointment. Patient was also instructed that they will need to review over the PAT instructions again at home before surgery.

## 2016-11-19 NOTE — H&P (Signed)
PEBBLE BOTKIN 11/04/2016 4:38 PM Location: [facility] Patient #: [id number] DOB: 1965/05/23 Married / Language: English / Race: White Female   History of Present Illness [name] MD; 11/04/2016 6:37 PM) The patient is a 51 year old female who presents with an abdominal mass. Pt is a 51 yo F referred for consultation by Dr. 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 She had a vague sense of bloating in the spring, and it was felt to be likely gyn in origin. She describes occasional bloating, but nothing significant enough to cause nausea/vomiting or pain. She has not had any weight loss. She denies bloody bowel movements. She saw GI and just to be complete, an abdominal ultrasound was ordered as well as a pelvic ultrasound. The pelvic ultrasound was negative, but the abdominal ultrasound showed multiple liver masses. MRI confirmed, a biopsy was done, then a Net spot dotatate scan was ordered. This revealed a small bowel area of high intensity wtih intussusception and several concerning lymph nodes. She denies diarrhea, flushing, issues with blood pressure, headaches, etc. She has good exercise tolerance.   Abd u/s 09/30/2016  IMPRESSION: Numerous mixed density solid and cystic masses throughout the liver. Findings concerning for metastatic disease. Recommend further evaluation with liver protocol MRI.  MR abd 10/02/2016 IMPRESSION: 1. Multiple enhancing hepatic lesions with central complexity and peripheral enhancement are most consistent with hepatic metastasis. Consider biopsy of the nodule in the lateral LEFT hepatic lobe. 2. Two adjacent lesion inferior RIGHT hepatic lobe are favored hemangioma. Ovoid biopsy of these lesions. 3. No primary carcinoma is identified. Consider FDG PET scan for source of primary versus CT chest abdomen pelvis. If Negative consider colonoscopy.  NETSPOT PET 10/23/2016 MPRESSION: 1. Multiple foci of  intense radiotracer accumulation within hepatic lesions consistent with well differentiated neuroendocrine tumor metastasis. 2. Enlarged nodes / implants within central mesenteries of the upper pelvis consistent nodal metastasis measuring up to 3.5 cm. 3. Short intussusceptions in the same vicinity(mid small bowel, upper pelvis) with adjacent radiotracer activity (misregistration) is favored site of primary small bowel neoplasm. 4. Additional small lymph nodes within the mesenteries with associated metabolic activity. 5. Deep RIGHT pelvis nodule with radiotracer activity consistent with pelvic metastasis  pathology 10/07/2016 Diagnosis Liver, needle/core biopsy NEUROENDOCRINE NEOPLASM, INTERMEDIATE GRADE  Chromogranin A- 10, (ULN 5)   Past Surgical History (Hadelyn Massenburg, LPN; 42/68/3419 6:22 PM) Breast Biopsy  Left. Cesarean Section - 1  Oral Surgery   Diagnostic Studies History (Hadelyn Massenburg, LPN; 29/79/8921 1:94 PM) Colonoscopy  1-5 years ago Mammogram  1-3 years ago Pap Smear  1-5 years ago  Allergies (Hadelyn Massenburg, LPN; 17/40/8144 8:18 PM) Penicillins  Percocet *ANALGESICS - OPIOID*   Medication History (Hadelyn Massenburg, LPN; 56/31/4970 2:63 PM) LORazepam (0.5MG  Tablet, Oral) Active. Cipro (500MG  Tablet, Oral) Active. Juice Plus Fibre (Oral) Active. Medications Reconciled  Social History Harriet Pho, LPN; 78/58/8502 7:74 PM) Alcohol use  Moderate alcohol use. Caffeine use  Coffee, Tea. No drug use  Tobacco use  Never smoker.  Family History Harriet Pho, LPN; 12/87/8676 7:20 PM) Alcohol Abuse  Brother, Daughter, Sister. Colon Cancer  Mother. Depression  Brother. Diabetes Mellitus  Mother. Ischemic Bowel Disease  Family Members In General. Respiratory Condition  Father. Thyroid problems  Brother.  Pregnancy / Birth History Harriet Pho, LPN; 94/70/9628 3:66 PM) Age at menarche  52 years. Age  of menopause  20-50 Contraceptive History  Intrauterine device. Gravida  4 Irregular periods  Maternal age  26-30 Para  2  Other Problems (Hadelyn Massenburg, LPN; 39/76/7341 9:37 PM) Anxiety Disorder  Hemorrhoids     Review of Systems (Hadelyn Massenburg LPN; 90/24/0973 5:32 PM) General Not Present- Appetite Loss, Chills, Fatigue, Fever, Night Sweats, Weight Gain and Weight Loss. Skin Not Present- Change in Wart/Mole, Dryness, Hives, Jaundice, New Lesions, Non-Healing Wounds, Rash and Ulcer. HEENT Not Present- Earache, Hearing Loss, Hoarseness, Nose Bleed, Oral Ulcers, Ringing in the Ears, Seasonal Allergies, Sinus Pain, Sore Throat, Visual Disturbances, Wears glasses/contact lenses and Yellow Eyes. Respiratory Present- Chronic Cough. Not Present- Bloody sputum, Difficulty Breathing, Snoring and Wheezing. Breast Present- Breast Pain. Not Present- Breast Mass, Nipple Discharge and Skin Changes. Gastrointestinal Present- Abdominal Pain and Bloating. Not Present- Bloody Stool, Change in Bowel Habits, Chronic diarrhea, Constipation, Difficulty Swallowing, Excessive gas, Gets full quickly at meals, Hemorrhoids, Indigestion, Nausea, Rectal Pain and Vomiting. Female Genitourinary Present- Frequency and Nocturia. Not Present- Painful Urination, Pelvic Pain and Urgency. Psychiatric Present- Anxiety and Fearful. Not Present- Bipolar, Change in Sleep Pattern, Depression and Frequent crying.  Vitals (Hadelyn Massenburg LPN; 99/24/2683 4:19 PM) 11/04/2016 4:41 PM Weight: 133.13 lb Height: 65in Body Surface Area: 1.66 m Body Mass Index: 22.15 kg/m  Temp.: 98.55F  Pulse: 73 (Regular)  Resp.: 18 (Unlabored)  P.OX: 99% (Room air) BP: 110/82 (Sitting, Left Arm, Standard)       Physical Exam [name] MD; 11/04/2016 6:31 PM) General Mental Status-Alert. General Appearance-Consistent with stated age. Hydration-Well hydrated. Voice-Normal.  Head and  Neck Head-normocephalic, atraumatic with no lesions or palpable masses. Trachea-midline. Thyroid Gland Characteristics - normal size and consistency.  Eye Eyeball - Bilateral-Extraocular movements intact. Sclera/Conjunctiva - Bilateral-No scleral icterus.  Chest and Lung Exam Chest and lung exam reveals -quiet, even and easy respiratory effort with no use of accessory muscles and on auscultation, normal breath sounds, no adventitious sounds and normal vocal resonance. Inspection Chest Wall - Normal. Back - normal.  Cardiovascular Cardiovascular examination reveals -normal heart sounds, regular rate and rhythm with no murmurs and normal pedal pulses bilaterally.  Abdomen Inspection Inspection of the abdomen reveals - No Hernias. Palpation/Percussion Palpation and Percussion of the abdomen reveal - Soft, Non Tender, No Rebound tenderness, No Rigidity (guarding) and No hepatosplenomegaly. Auscultation Auscultation of the abdomen reveals - Bowel sounds normal.  Neurologic Neurologic evaluation reveals -alert and oriented x 3 with no impairment of recent or remote memory. Mental Status-Normal.  Musculoskeletal Global Assessment -Note: no gross deformities.  Normal Exam - Left-Upper Extremity Strength Normal and Lower Extremity Strength Normal. Normal Exam - Right-Upper Extremity Strength Normal and Lower Extremity Strength Normal.  Lymphatic Head & Neck  General Head & Neck Lymphatics: Bilateral - Description - Normal. Axillary  General Axillary Region: Bilateral - Description - Normal. Tenderness - Non Tender. Femoral & Inguinal  Generalized Femoral & Inguinal Lymphatics: Bilateral - Description - No Generalized lymphadenopathy.    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 Pt is having few symptoms, but has high risk of bowel obstruction or perforation wtih these findings and a chronic lead point.  Discussed surgery with patient and her husband. Reviewed risks including bleeding, infection, wound issues, bowel leak, possible need for additional surgery, possible larger incision, possible heart or lung complications, and possible hernia. Reviewed time off work and post op restrictions as well as time in  hospital and recovery period.  Discussed strategies for pain control and endpoints to meet prior to discharge. Pt understands and wishes to proceed.  Will plan for around 2 weeks. 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

## 2016-11-20 ENCOUNTER — Other Ambulatory Visit (HOSPITAL_COMMUNITY): Payer: Self-pay

## 2016-11-21 ENCOUNTER — Inpatient Hospital Stay (HOSPITAL_COMMUNITY): Payer: BLUE CROSS/BLUE SHIELD | Admitting: Certified Registered Nurse Anesthetist

## 2016-11-21 ENCOUNTER — Inpatient Hospital Stay (HOSPITAL_COMMUNITY)
Admission: RE | Admit: 2016-11-21 | Discharge: 2016-11-24 | DRG: 330 | Disposition: A | Discharge status: Home / Self Care | Payer: BLUE CROSS/BLUE SHIELD | Source: Ambulatory Visit | Attending: General Surgery | Admitting: General Surgery

## 2016-11-21 ENCOUNTER — Encounter (HOSPITAL_COMMUNITY): Payer: Self-pay | Admitting: *Deleted

## 2016-11-21 ENCOUNTER — Encounter (HOSPITAL_COMMUNITY)
Admission: RE | Disposition: A | Discharge status: Home / Self Care | Payer: Self-pay | Source: Ambulatory Visit | Attending: General Surgery

## 2016-11-21 DIAGNOSIS — K561 Intussusception: Principal | ICD-10-CM | POA: Diagnosis present

## 2016-11-21 DIAGNOSIS — C7A019 Malignant carcinoid tumor of the small intestine, unspecified portion: Secondary | ICD-10-CM | POA: Diagnosis present

## 2016-11-21 DIAGNOSIS — Z79899 Other long term (current) drug therapy: Secondary | ICD-10-CM

## 2016-11-21 DIAGNOSIS — C787 Secondary malignant neoplasm of liver and intrahepatic bile duct: Secondary | ICD-10-CM | POA: Diagnosis present

## 2016-11-21 DIAGNOSIS — Z880 Allergy status to penicillin: Secondary | ICD-10-CM | POA: Diagnosis not present

## 2016-11-21 DIAGNOSIS — D500 Iron deficiency anemia secondary to blood loss (chronic): Secondary | ICD-10-CM | POA: Diagnosis present

## 2016-11-21 DIAGNOSIS — Z800 Family history of malignant neoplasm of digestive organs: Secondary | ICD-10-CM

## 2016-11-21 DIAGNOSIS — Z23 Encounter for immunization: Secondary | ICD-10-CM

## 2016-11-21 HISTORY — DX: Benign carcinoid tumor of the large intestine, unspecified portion: D3A.029

## 2016-11-21 HISTORY — PX: BOWEL RESECTION: SHX1257

## 2016-11-21 LAB — CREATININE, SERUM
Creatinine, Ser: 0.61 mg/dL (ref 0.44–1.00)
GFR calc Af Amer: >60 mL/min
GFR calc non Af Amer: >60 mL/min

## 2016-11-21 LAB — CBC
HCT: 31.1 % — ABNORMAL LOW (ref 36.0–46.0)
Hemoglobin: 8.8 g/dL — ABNORMAL LOW (ref 12.0–15.0)
MCH: 20.1 pg — ABNORMAL LOW (ref 26.0–34.0)
MCHC: 28.3 g/dL — ABNORMAL LOW (ref 30.0–36.0)
MCV: 71 fL — ABNORMAL LOW (ref 78.0–100.0)
Platelets: 233 K/uL (ref 150–400)
RBC: 4.38 MIL/uL (ref 3.87–5.11)
RDW: 19.2 % — ABNORMAL HIGH (ref 11.5–15.5)
WBC: 6.8 K/uL (ref 4.0–10.5)

## 2016-11-21 SURGERY — EXCISION, SMALL INTESTINE
Anesthesia: General | Site: Abdomen

## 2016-11-21 MED ORDER — LIDOCAINE 2% (20 MG/ML) 5 ML SYRINGE
INTRAMUSCULAR | Status: AC
  Filled 2016-11-21: qty 5

## 2016-11-21 MED ORDER — CIPROFLOXACIN IN D5W 400 MG/200ML IV SOLN
400.0000 mg | INTRAVENOUS | Status: AC
  Administered 2016-11-21: 400 mg via INTRAVENOUS

## 2016-11-21 MED ORDER — ONDANSETRON HCL 4 MG/2ML IJ SOLN
4.0000 mg | Freq: Four times a day (QID) | INTRAMUSCULAR | Status: DC | PRN
  Administered 2016-11-21: 4 mg via INTRAVENOUS
  Filled 2016-11-21: qty 2

## 2016-11-21 MED ORDER — DIPHENHYDRAMINE HCL 12.5 MG/5ML PO ELIX: 12.5000 mg | ORAL_SOLUTION | Freq: Four times a day (QID) | ORAL | Status: DC | PRN

## 2016-11-21 MED ORDER — CIPROFLOXACIN IN D5W 400 MG/200ML IV SOLN
INTRAVENOUS | Status: AC
  Filled 2016-11-21: qty 200

## 2016-11-21 MED ORDER — FENTANYL CITRATE (PF) 250 MCG/5ML IJ SOLN
INTRAMUSCULAR | Status: AC
  Filled 2016-11-21: qty 5

## 2016-11-21 MED ORDER — ENOXAPARIN SODIUM 40 MG/0.4ML ~~LOC~~ SOLN
40.0000 mg | SUBCUTANEOUS | Status: DC
  Administered 2016-11-22 – 2016-11-23 (×2): 40 mg via SUBCUTANEOUS
  Filled 2016-11-21 (×2): qty 0.4

## 2016-11-21 MED ORDER — SODIUM CHLORIDE 0.9 % IR SOLN
Status: DC | PRN
  Administered 2016-11-21: 1000 mL

## 2016-11-21 MED ORDER — ACETAMINOPHEN 500 MG PO TABS
1000.0000 mg | ORAL_TABLET | Freq: Four times a day (QID) | ORAL | Status: DC
  Administered 2016-11-21 – 2016-11-24 (×11): 1000 mg via ORAL
  Filled 2016-11-21 (×12): qty 2

## 2016-11-21 MED ORDER — CIPROFLOXACIN IN D5W 400 MG/200ML IV SOLN
400.0000 mg | Freq: Two times a day (BID) | INTRAVENOUS | Status: AC
  Administered 2016-11-21: 400 mg via INTRAVENOUS
  Filled 2016-11-21: qty 200

## 2016-11-21 MED ORDER — GABAPENTIN 300 MG PO CAPS
300.0000 mg | ORAL_CAPSULE | Freq: Two times a day (BID) | ORAL | Status: DC
  Administered 2016-11-21 – 2016-11-23 (×6): 300 mg via ORAL
  Filled 2016-11-21 (×6): qty 1

## 2016-11-21 MED ORDER — ONDANSETRON HCL 4 MG/2ML IJ SOLN
INTRAMUSCULAR | Status: DC | PRN
  Administered 2016-11-21: 4 mg via INTRAVENOUS

## 2016-11-21 MED ORDER — ALVIMOPAN 12 MG PO CAPS
ORAL_CAPSULE | ORAL | Status: AC
  Administered 2016-11-21: 12 mg via ORAL
  Filled 2016-11-21: qty 1

## 2016-11-21 MED ORDER — TRAMADOL HCL 50 MG PO TABS
50.0000 mg | ORAL_TABLET | Freq: Four times a day (QID) | ORAL | Status: DC | PRN
  Administered 2016-11-21 – 2016-11-23 (×2): 50 mg via ORAL
  Filled 2016-11-21 (×2): qty 1

## 2016-11-21 MED ORDER — BUPIVACAINE-EPINEPHRINE 0.25% -1:200000 IJ SOLN
INTRAMUSCULAR | Status: DC | PRN
  Administered 2016-11-21: 30 mL

## 2016-11-21 MED ORDER — HYDROMORPHONE HCL 1 MG/ML IJ SOLN
0.2500 mg | INTRAMUSCULAR | Status: DC | PRN
  Administered 2016-11-21 (×2): 0.5 mg via INTRAVENOUS

## 2016-11-21 MED ORDER — FENTANYL CITRATE (PF) 100 MCG/2ML IJ SOLN
INTRAMUSCULAR | Status: DC | PRN
  Administered 2016-11-21: 100 ug via INTRAVENOUS
  Administered 2016-11-21: 50 ug via INTRAVENOUS
  Administered 2016-11-21: 100 ug via INTRAVENOUS

## 2016-11-21 MED ORDER — ONDANSETRON HCL 4 MG/2ML IJ SOLN: 4.0000 mg | Freq: Once | INTRAMUSCULAR | Status: DC | PRN

## 2016-11-21 MED ORDER — ROCURONIUM BROMIDE 10 MG/ML (PF) SYRINGE
PREFILLED_SYRINGE | INTRAVENOUS | Status: DC | PRN
  Administered 2016-11-21: 50 mg via INTRAVENOUS

## 2016-11-21 MED ORDER — ONDANSETRON HCL 4 MG/2ML IJ SOLN
INTRAMUSCULAR | Status: AC
  Filled 2016-11-21: qty 2

## 2016-11-21 MED ORDER — 0.9 % SODIUM CHLORIDE (POUR BTL) OPTIME
TOPICAL | Status: DC | PRN
  Administered 2016-11-21: 1000 mL

## 2016-11-21 MED ORDER — KETOROLAC TROMETHAMINE 30 MG/ML IJ SOLN
30.0000 mg | Freq: Four times a day (QID) | INTRAMUSCULAR | Status: AC
  Administered 2016-11-21 – 2016-11-23 (×8): 30 mg via INTRAVENOUS
  Filled 2016-11-21 (×8): qty 1

## 2016-11-21 MED ORDER — PROPOFOL 10 MG/ML IV BOLUS
INTRAVENOUS | Status: AC
  Filled 2016-11-21: qty 40

## 2016-11-21 MED ORDER — ACETAMINOPHEN 500 MG PO TABS
ORAL_TABLET | ORAL | Status: AC
  Administered 2016-11-21: 1000 mg via ORAL
  Filled 2016-11-21: qty 2

## 2016-11-21 MED ORDER — METHOCARBAMOL 1000 MG/10ML IJ SOLN
500.0000 mg | Freq: Three times a day (TID) | INTRAVENOUS | Status: DC | PRN
  Filled 2016-11-21: qty 5

## 2016-11-21 MED ORDER — MIDAZOLAM HCL 2 MG/2ML IJ SOLN
INTRAMUSCULAR | Status: AC
  Filled 2016-11-21: qty 2

## 2016-11-21 MED ORDER — CHLORHEXIDINE GLUCONATE CLOTH 2 % EX PADS: 6.0000 | MEDICATED_PAD | Freq: Once | CUTANEOUS | Status: DC

## 2016-11-21 MED ORDER — EPHEDRINE 5 MG/ML INJ
INTRAVENOUS | Status: AC
  Filled 2016-11-21: qty 10

## 2016-11-21 MED ORDER — ONDANSETRON 4 MG PO TBDP: 4.0000 mg | ORAL_TABLET | Freq: Four times a day (QID) | ORAL | Status: DC | PRN

## 2016-11-21 MED ORDER — LACTATED RINGERS IV SOLN
INTRAVENOUS | Status: DC | PRN
  Administered 2016-11-21 (×2): via INTRAVENOUS

## 2016-11-21 MED ORDER — DIPHENHYDRAMINE HCL 50 MG/ML IJ SOLN: 12.5000 mg | Freq: Four times a day (QID) | INTRAMUSCULAR | Status: DC | PRN

## 2016-11-21 MED ORDER — MIDAZOLAM HCL 5 MG/5ML IJ SOLN
INTRAMUSCULAR | Status: DC | PRN
  Administered 2016-11-21: 2 mg via INTRAVENOUS

## 2016-11-21 MED ORDER — LIDOCAINE 2% (20 MG/ML) 5 ML SYRINGE
INTRAMUSCULAR | Status: DC | PRN
  Administered 2016-11-21: 100 mg via INTRAVENOUS

## 2016-11-21 MED ORDER — METOPROLOL TARTRATE 5 MG/5ML IV SOLN
5.0000 mg | Freq: Four times a day (QID) | INTRAVENOUS | Status: DC | PRN
Start: 2016-11-21 — End: 2016-11-24

## 2016-11-21 MED ORDER — LIDOCAINE HCL (PF) 1 % IJ SOLN
INTRAMUSCULAR | Status: AC
  Filled 2016-11-21: qty 30

## 2016-11-21 MED ORDER — DEXAMETHASONE SODIUM PHOSPHATE 10 MG/ML IJ SOLN
INTRAMUSCULAR | Status: AC
  Filled 2016-11-21: qty 1

## 2016-11-21 MED ORDER — ROCURONIUM BROMIDE 10 MG/ML (PF) SYRINGE
PREFILLED_SYRINGE | INTRAVENOUS | Status: AC
  Filled 2016-11-21: qty 5

## 2016-11-21 MED ORDER — EPHEDRINE SULFATE 50 MG/ML IJ SOLN
INTRAMUSCULAR | Status: DC | PRN
  Administered 2016-11-21: 5 mg via INTRAVENOUS
  Administered 2016-11-21: 10 mg via INTRAVENOUS

## 2016-11-21 MED ORDER — ACETAMINOPHEN 500 MG PO TABS
1000.0000 mg | ORAL_TABLET | ORAL | Status: AC
  Administered 2016-11-21: 1000 mg via ORAL

## 2016-11-21 MED ORDER — GABAPENTIN 300 MG PO CAPS
ORAL_CAPSULE | ORAL | Status: AC
  Administered 2016-11-21: 300 mg via ORAL
  Filled 2016-11-21: qty 1

## 2016-11-21 MED ORDER — ALVIMOPAN 12 MG PO CAPS
12.0000 mg | ORAL_CAPSULE | ORAL | Status: AC
  Administered 2016-11-21: 12 mg via ORAL

## 2016-11-21 MED ORDER — DEXAMETHASONE SODIUM PHOSPHATE 10 MG/ML IJ SOLN
INTRAMUSCULAR | Status: DC | PRN
  Administered 2016-11-21: 10 mg via INTRAVENOUS

## 2016-11-21 MED ORDER — MORPHINE SULFATE (PF) 4 MG/ML IV SOLN: 1.0000 mg | INTRAVENOUS | Status: DC | PRN

## 2016-11-21 MED ORDER — GABAPENTIN 300 MG PO CAPS
300.0000 mg | ORAL_CAPSULE | ORAL | Status: AC
  Administered 2016-11-21: 300 mg via ORAL

## 2016-11-21 MED ORDER — HYDROMORPHONE HCL 1 MG/ML IJ SOLN
INTRAMUSCULAR | Status: AC
  Administered 2016-11-21: 0.5 mg via INTRAVENOUS
  Filled 2016-11-21: qty 1

## 2016-11-21 MED ORDER — LIDOCAINE HCL (PF) 1 % IJ SOLN
INTRAMUSCULAR | Status: DC | PRN
  Administered 2016-11-21: 30 mL

## 2016-11-21 MED ORDER — SIMETHICONE 80 MG PO CHEW: 40.0000 mg | CHEWABLE_TABLET | Freq: Four times a day (QID) | ORAL | Status: DC | PRN

## 2016-11-21 MED ORDER — ALVIMOPAN 12 MG PO CAPS
12.0000 mg | ORAL_CAPSULE | Freq: Two times a day (BID) | ORAL | Status: DC
  Administered 2016-11-22 – 2016-11-23 (×3): 12 mg via ORAL
  Filled 2016-11-21 (×3): qty 1

## 2016-11-21 MED ORDER — PROPOFOL 10 MG/ML IV BOLUS
INTRAVENOUS | Status: DC | PRN
  Administered 2016-11-21: 100 mg via INTRAVENOUS

## 2016-11-21 MED ORDER — NEOSTIGMINE METHYLSULFATE 5 MG/5ML IV SOSY
PREFILLED_SYRINGE | INTRAVENOUS | Status: AC
  Filled 2016-11-21: qty 5

## 2016-11-21 MED ORDER — KETOROLAC TROMETHAMINE 30 MG/ML IJ SOLN: 30.0000 mg | Freq: Four times a day (QID) | INTRAMUSCULAR | Status: DC | PRN

## 2016-11-21 MED ORDER — GLYCOPYRROLATE 0.2 MG/ML IV SOSY
PREFILLED_SYRINGE | INTRAVENOUS | Status: DC | PRN
  Administered 2016-11-21: 0.4 mg via INTRAVENOUS

## 2016-11-21 MED ORDER — NEOSTIGMINE METHYLSULFATE 5 MG/5ML IV SOSY
PREFILLED_SYRINGE | INTRAVENOUS | Status: DC | PRN
  Administered 2016-11-21: 3 mg via INTRAVENOUS

## 2016-11-21 MED ORDER — LORAZEPAM 0.5 MG PO TABS
0.5000 mg | ORAL_TABLET | Freq: Every evening | ORAL | Status: DC | PRN
  Administered 2016-11-21 – 2016-11-23 (×3): 0.5 mg via ORAL
  Filled 2016-11-21 (×3): qty 1

## 2016-11-21 MED ORDER — BUPIVACAINE-EPINEPHRINE (PF) 0.25% -1:200000 IJ SOLN
INTRAMUSCULAR | Status: AC
  Filled 2016-11-21: qty 30

## 2016-11-21 MED ORDER — MEPERIDINE HCL 25 MG/ML IJ SOLN: 6.2500 mg | INTRAMUSCULAR | Status: DC | PRN

## 2016-11-21 MED ORDER — KCL IN DEXTROSE-NACL 20-5-0.45 MEQ/L-%-% IV SOLN
INTRAVENOUS | Status: DC
  Administered 2016-11-21 – 2016-11-23 (×4): via INTRAVENOUS
  Filled 2016-11-21 (×4): qty 1000

## 2016-11-21 SURGICAL SUPPLY — 48 items
ADH SKN CLS APL DERMABOND .7 (GAUZE/BANDAGES/DRESSINGS) ×2
BLADE CLIPPER SURG (BLADE) IMPLANT
CANISTER SUCT 3000ML PPV (MISCELLANEOUS) ×3 IMPLANT
CELLS DAT CNTRL 66122 CELL SVR (MISCELLANEOUS) ×1 IMPLANT
CHLORAPREP W/TINT 26ML (MISCELLANEOUS) ×3 IMPLANT
COVER SURGICAL LIGHT HANDLE (MISCELLANEOUS) ×3 IMPLANT
DERMABOND ADVANCED (GAUZE/BANDAGES/DRESSINGS) ×4
DERMABOND ADVANCED .7 DNX12 (GAUZE/BANDAGES/DRESSINGS) IMPLANT
DRAPE UTILITY XL STRL (DRAPES) ×6 IMPLANT
DRAPE WARM FLUID 44X44 (DRAPE) ×3 IMPLANT
ELECT CAUTERY BLADE 6.4 (BLADE) ×4 IMPLANT
ELECT REM PT RETURN 9FT ADLT (ELECTROSURGICAL) ×3
ELECTRODE REM PT RTRN 9FT ADLT (ELECTROSURGICAL) ×1 IMPLANT
GLOVE BIO SURGEON STRL SZ 6 (GLOVE) ×6 IMPLANT
GLOVE BIOGEL PI IND STRL 6.5 (GLOVE) ×1 IMPLANT
GLOVE BIOGEL PI INDICATOR 6.5 (GLOVE) ×2
GOWN STRL REUS W/ TWL LRG LVL3 (GOWN DISPOSABLE) ×2 IMPLANT
GOWN STRL REUS W/TWL 2XL LVL3 (GOWN DISPOSABLE) ×3 IMPLANT
GOWN STRL REUS W/TWL LRG LVL3 (GOWN DISPOSABLE) ×6
KIT BASIN OR (CUSTOM PROCEDURE TRAY) ×3 IMPLANT
KIT ROOM TURNOVER OR (KITS) ×3 IMPLANT
L-HOOK LAP DISP 36CM (ELECTROSURGICAL) ×3
LHOOK LAP DISP 36CM (ELECTROSURGICAL) IMPLANT
NS IRRIG 1000ML POUR BTL (IV SOLUTION) ×6 IMPLANT
PAD ARMBOARD 7.5X6 YLW CONV (MISCELLANEOUS) ×3 IMPLANT
RELOAD PROXIMATE 75MM BLUE (ENDOMECHANICALS) ×6 IMPLANT
RELOAD STAPLE 75 3.8 BLU REG (ENDOMECHANICALS) IMPLANT
RETRACTOR WND ALEXIS 18 MED (MISCELLANEOUS) IMPLANT
RTRCTR WOUND ALEXIS 18CM MED (MISCELLANEOUS) ×3
SEALER TISSUE G2 STRG ARTC 35C (ENDOMECHANICALS) ×2 IMPLANT
SET IRRIG TUBING LAPAROSCOPIC (IRRIGATION / IRRIGATOR) ×2 IMPLANT
SPECIMEN JAR LARGE (MISCELLANEOUS) ×3 IMPLANT
STAPLER GUN LINEAR PROX 60 (STAPLE) ×2 IMPLANT
STAPLER PROXIMATE 75MM BLUE (STAPLE) ×2 IMPLANT
SUCTION POOLE TIP (SUCTIONS) ×3 IMPLANT
SUT MNCRL AB 4-0 PS2 18 (SUTURE) ×4 IMPLANT
SUT PDS II 0 TP-1 LOOPED 60 (SUTURE) ×4 IMPLANT
SUT VIC AB 2-0 SH 18 (SUTURE) ×3 IMPLANT
SUT VIC AB 2-0 SH 27 (SUTURE) ×3
SUT VIC AB 2-0 SH 27XBRD (SUTURE) IMPLANT
SUT VIC AB 3-0 SH 18 (SUTURE) ×3 IMPLANT
SUT VIC AB 3-0 SH 27 (SUTURE) ×3
SUT VIC AB 3-0 SH 27X BRD (SUTURE) IMPLANT
TOWEL OR 17X26 10 PK STRL BLUE (TOWEL DISPOSABLE) ×3 IMPLANT
TRAY FOLEY W/METER SILVER 16FR (SET/KITS/TRAYS/PACK) ×2 IMPLANT
TRAY LAPAROSCOPIC MC (CUSTOM PROCEDURE TRAY) ×2 IMPLANT
TUBING INSUFFLATION (TUBING) ×2 IMPLANT
YANKAUER SUCT BULB TIP NO VENT (SUCTIONS) ×3 IMPLANT

## 2016-11-21 NOTE — Interval H&P Note (Signed)
History and Physical Interval Note:  11/21/2016 7:28 AM  Brittney [name]  has presented today for surgery, with the diagnosis of [place]  The various methods of treatment have been discussed with the patient and family. After consideration of risks, benefits and other options for treatment, the patient has consented to  Procedure(s): LAPAROSCOPIC SMALL BOWEL RESECTION (N/A) as a surgical intervention .  The patient's history has been reviewed, patient examined, no change in status, stable for surgery.  I have reviewed the patient's chart and labs.  Questions were answered to the patient's satisfaction.     [name]

## 2016-11-21 NOTE — Anesthesia Preprocedure Evaluation (Signed)
Anesthesia Evaluation  Patient identified by MRN, date of birth, ID band Patient awake    Reviewed: Allergy & Precautions, NPO status   Airway Mallampati: I  TM Distance: >3 FB Neck ROM: Full    Dental   Pulmonary    Pulmonary exam normal        Cardiovascular Normal cardiovascular exam     Neuro/Psych    GI/Hepatic   Endo/Other    Renal/GU      Musculoskeletal   Abdominal   Peds  Hematology   Anesthesia Other Findings   Reproductive/Obstetrics                             Anesthesia Physical Anesthesia Plan  ASA: II  Anesthesia Plan: General   Post-op Pain Management:    Induction: Intravenous  PONV Risk Score and Plan: 3 and Ondansetron, Dexamethasone and Midazolam  Airway Management Planned: Oral ETT  Additional Equipment:   Intra-op Plan:   Post-operative Plan: Extubation in OR  Informed Consent: I have reviewed the patients History and Physical, chart, labs and discussed the procedure including the risks, benefits and alternatives for the proposed anesthesia with the patient or authorized representative who has indicated his/her understanding and acceptance.     Plan Discussed with: CRNA and Surgeon  Anesthesia Plan Comments:         Anesthesia Quick Evaluation

## 2016-11-21 NOTE — Transfer of Care (Signed)
Immediate Anesthesia Transfer of Care Note  Patient: Brittney [name]  Procedure(s) Performed: LAPAROSCOPIC SMALL BOWEL RESECTION (N/A Abdomen)  Patient Location: PACU  Anesthesia Type:General  Level of Consciousness: awake, patient cooperative and responds to stimulation  Airway & Oxygen Therapy: Patient Spontanous Breathing and Patient connected to nasal cannula oxygen  Post-op Assessment: Report given to RN and Post -op Vital signs reviewed and stable  Post vital signs: Reviewed and stable  Last Vitals:  Vitals:   11/21/16 0607 11/21/16 0920  BP: 103/68 (!) 101/57  Pulse:  79  Resp:  13  Temp:  36.4 C  SpO2:  100%    Last Pain:  Vitals:   11/21/16 0920  TempSrc:   PainSc: 0-No pain      Patients Stated Pain Goal: 3 (97/84/78 4128)  Complications: No apparent anesthesia complications

## 2016-11-21 NOTE — Op Note (Signed)
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  FINDINGS:  Mid ileal mass with small amount insussusception and large lymph node mass in mesentery.  Several known liver mets were seen.    EBL: min  PROCEDURE:    Patient was dentified in the holding area and taken operating room where she was placed supine on operating room table. General anesthesia was induced. Her arms were tucked, a Foley catheter was placed, and her abdomen was prepped and draped in sterile fashion. A timeout was performed according to the surgical safety checklist. When all was correct, we continued.  Patient was placed into reverse Trendelenburg position and rotated to the right. Local anesthetic was administered at the left costal margin and the 5 mm Optiview port was placed under direct visualization. Pneumoperitoneum was achieved to a pressure of 15 mmHg. 2 additional 5 mm ports were placed in the left abdomen after administration of local. The small bowel was run starting from the terminal ileum to the ligament of Treitz. A large mass was found in the mid ileum along with a large lymph node mass. There were no additional lesions located in the small bowel. There were several visible liver masses seen.  The mesentery was divided with the Enseal. Once adequate small bowel mobilized and the lymph node mass at been excised, a small extraction port  was placed under the umbilicus approximately 4 cm in length.  An Geiger wound protector was placed. The small bowel was extracted from the abdomen and appropriate sites for division were located. The GIA-75 millimeter stapler was used to divide the bowel. A side-to-side, functional end-to-end anastomosis was created with the GIA and TA staplers. 3-0 Vicryl were used to reinforce the crotch of the anastomosis. A running 2-0 Vicryl was used to close the mesenteric defect. There was good blood supply to the anastomosis.  The abdomen was then irrigated. The omentum was pulled down over the small bowel. The extraction port fascia was closed with running 0 looped PDS suture. The skin of the port sites closed with 4-0 Monocryl in subcuticular fashion. The extraction port skin was closed with interrupted 3-0 Vicryl deep sutures and running 4-0 Monocryl subcutaneous cuticular suture. The wounds were then cleaned, dried, and dressed with Dermabond.  The patient was allowed to emerge from anesthesia and taken the PACU in stable condition. Needle, sponge, and instrument counts were correct 2.

## 2016-11-21 NOTE — Anesthesia Postprocedure Evaluation (Signed)
Anesthesia Post Note  Patient: Brittney [name]  Procedure(s) Performed: LAPAROSCOPIC SMALL BOWEL RESECTION (N/A Abdomen)     Patient location during evaluation: PACU Anesthesia Type: General Level of consciousness: awake and alert Pain management: pain level controlled Vital Signs Assessment: post-procedure vital signs reviewed and stable Respiratory status: spontaneous breathing, nonlabored ventilation, respiratory function stable and patient connected to nasal cannula oxygen Cardiovascular status: blood pressure returned to baseline and stable Postop Assessment: no apparent nausea or vomiting Anesthetic complications: no    Last Vitals:  Vitals:   11/21/16 0950 11/21/16 1000  BP: 99/60 104/61  Pulse: (!) 58 63  Resp: 12 18  Temp:  36.5 C  SpO2: 100% 97%    Last Pain:  Vitals:   11/21/16 0946  TempSrc:   PainSc: [place]

## 2016-11-22 ENCOUNTER — Encounter (HOSPITAL_COMMUNITY): Payer: Self-pay | Admitting: General Surgery

## 2016-11-22 LAB — CBC
HCT: 28 % — ABNORMAL LOW (ref 36.0–46.0)
Hemoglobin: 8.1 g/dL — ABNORMAL LOW (ref 12.0–15.0)
MCH: 20.2 pg — AB (ref 26.0–34.0)
MCHC: 28.9 g/dL — ABNORMAL LOW (ref 30.0–36.0)
MCV: 69.8 fL — AB (ref 78.0–100.0)
PLATELETS: 229 10*3/uL (ref 150–400)
RBC: 4.01 MIL/uL (ref 3.87–5.11)
RDW: 18.6 % — AB (ref 11.5–15.5)
WBC: 8.4 10*3/uL (ref 4.0–10.5)

## 2016-11-22 LAB — BASIC METABOLIC PANEL
Anion gap: 4 — ABNORMAL LOW (ref 5–15)
CALCIUM: 8.4 mg/dL — AB (ref 8.9–10.3)
CO2: 28 mmol/L (ref 22–32)
Chloride: 107 mmol/L (ref 101–111)
Creatinine, Ser: 0.6 mg/dL (ref 0.44–1.00)
GFR calc Af Amer: >60 mL/min (ref >60)
GLUCOSE: 119 mg/dL — AB (ref 65–99)
POTASSIUM: 3.9 mmol/L (ref 3.5–5.1)
SODIUM: 139 mmol/L (ref 135–145)

## 2016-11-22 MED ORDER — PNEUMOCOCCAL 13-VAL CONJ VACC IM SUSP
0.5000 mL | INTRAMUSCULAR | Status: AC
  Administered 2016-11-23: 0.5 mL via INTRAMUSCULAR
  Filled 2016-11-22: qty 0.5

## 2016-11-22 MED ORDER — INFLUENZA VAC SPLIT QUAD 0.5 ML IM SUSY
0.5000 mL | PREFILLED_SYRINGE | INTRAMUSCULAR | Status: AC
  Administered 2016-11-23: 0.5 mL via INTRAMUSCULAR
  Filled 2016-11-22: qty 0.5

## 2016-11-22 NOTE — Progress Notes (Signed)
1 Day Post-Op   Subjective/Chief Complaint: Had an episode of emesis last night.  This is resolved this AM. Still having some belching.  No flatus yet.     Objective: Vital signs in last 24 hours: Temp:  [97.6 F (36.4 C)-98.6 F (37 C)] 98.6 F (37 C) (11/16 0513) Pulse Rate:  [57-79] 74 (11/16 0513) Resp:  [10-18] 16 (11/16 0513) BP: (90-106)/(57-69) 90/62 (11/16 0513) SpO2:  [95 %-100 %] 100 % (11/16 0513) Last BM Date: 11/20/16  Intake/Output from previous day: 11/15 0701 - 11/16 0700 In: 3510 [P.O.:600; I.V.:2835] Out: 535 [Urine:285; Emesis/NG output:200; Blood:50] Intake/Output this shift: No intake/output data recorded.  General appearance: alert, cooperative and no distress Resp: breathing comfortably GI: soft, non distended, approp tender incision c/d/i.  Lab Results:  Recent Labs    11/21/16 1121 11/22/16 0343  WBC 6.8 8.4  HGB 8.8* 8.1*  HCT 31.1* 28.0*  PLT 233 229   BMET Recent Labs    11/21/16 1121 11/22/16 0343  NA  --  139  K  --  3.9  CL  --  107  CO2  --  28  GLUCOSE  --  119*  BUN  --  <5*  CREATININE 0.61 0.60  CALCIUM  --  8.4*   PT/INR No results for input(s): LABPROT, INR in the last 72 hours. ABG No results for input(s): PHART, HCO3 in the last 72 hours.  Invalid input(s): PCO2, PO2  Studies/Results: No results found.  Anti-infectives: Anti-infectives (From admission, onward)   Start     Dose/Rate Route Frequency Ordered Stop   11/21/16 2000  ciprofloxacin (CIPRO) IVPB 400 mg     400 mg 200 mL/hr over 60 Minutes Intravenous Every 12 hours 11/21/16 1118 11/21/16 2152   11/21/16 0551  ciprofloxacin (CIPRO) 400 MG/200ML IVPB    Comments:  [name]   : cabinet override      11/21/16 0551 11/21/16 0747   11/21/16 0544  ciprofloxacin (CIPRO) IVPB 400 mg     400 mg 200 mL/hr over 60 Minutes Intravenous On call to O.R. 11/21/16 0545 11/21/16 0747      Assessment/Plan: s/p Procedure(s): LAPAROSCOPIC SMALL BOWEL  RESECTION (N/A)  Pain control Chronic blood loss anemia (pre op HCT 33.1) and acute blood loss anemia (HCT 28.0) Ambulate Await return of bowel function.   LOS: 1 day    Brittney [name] 11/22/2016

## 2016-11-23 ENCOUNTER — Other Ambulatory Visit: Payer: Self-pay

## 2016-11-23 LAB — BASIC METABOLIC PANEL
Anion gap: 5 (ref 5–15)
BUN: 5 mg/dL — AB (ref 6–20)
CALCIUM: 8.1 mg/dL — AB (ref 8.9–10.3)
CHLORIDE: 107 mmol/L (ref 101–111)
CO2: 27 mmol/L (ref 22–32)
CREATININE: 0.61 mg/dL (ref 0.44–1.00)
Glucose, Bld: 87 mg/dL (ref 65–99)
Potassium: 4 mmol/L (ref 3.5–5.1)
Sodium: 139 mmol/L (ref 135–145)

## 2016-11-23 LAB — CBC
HCT: 28.5 % — ABNORMAL LOW (ref 36.0–46.0)
Hemoglobin: 8 g/dL — ABNORMAL LOW (ref 12.0–15.0)
MCH: 19.8 pg — AB (ref 26.0–34.0)
MCHC: 28.1 g/dL — AB (ref 30.0–36.0)
MCV: 70.5 fL — ABNORMAL LOW (ref 78.0–100.0)
PLATELETS: 225 10*3/uL (ref 150–400)
RBC: 4.04 MIL/uL (ref 3.87–5.11)
RDW: 19 % — AB (ref 11.5–15.5)
WBC: 6.9 10*3/uL (ref 4.0–10.5)

## 2016-11-23 NOTE — Plan of Care (Signed)
  Progressing Education: Knowledge of General Education information will improve 11/23/2016 0436 - Progressing by Anson Fret, RN Note POC reviewed with pt. Pain Managment: General experience of comfort will improve 11/23/2016 0436 - Progressing by Anson Fret, RN   Completed/Met Activity: Risk for activity intolerance will decrease 11/23/2016 0436 - Completed/Met by Anson Fret, RN Note Pt. ambulating to bathroom well with standby assist.

## 2016-11-23 NOTE — Progress Notes (Signed)
2 Days Post-Op   Subjective/Chief Complaint: No complaints. Had a bm. Feeling better   Objective: Vital signs in last 24 hours: Temp:  [97.6 F (36.4 C)-98.3 F (36.8 C)] 97.6 F (36.4 C) (11/17 0615) Pulse Rate:  [67-72] 67 (11/17 0615) Resp:  [16] 16 (11/17 0615) BP: (98-105)/(65-69) 105/69 (11/17 0615) SpO2:  [95 %-98 %] 98 % (11/17 0615) Last BM Date: 11/20/16  Intake/Output from previous day: 11/16 0701 - 11/17 0700 In: 300 [P.O.:300] Out: -  Intake/Output this shift: No intake/output data recorded.  General appearance: alert and cooperative Resp: clear to auscultation bilaterally Cardio: regular rate and rhythm GI: soft, minimal tenderness. incision looks good  Lab Results:  Recent Labs    11/22/16 0343 11/23/16 0358  WBC 8.4 6.9  HGB 8.1* 8.0*  HCT 28.0* 28.5*  PLT 229 225   BMET Recent Labs    11/22/16 0343 11/23/16 0358  NA 139 139  K 3.9 4.0  CL 107 107  CO2 28 27  GLUCOSE 119* 87  BUN <5* 5*  CREATININE 0.60 0.61  CALCIUM 8.4* 8.1*   PT/INR No results for input(Brittney [name]): LABPROT, INR in the last 72 hours. ABG No results for input(Brittney [name]): PHART, HCO3 in the last 72 hours.  Invalid input(Brittney [name]): PCO2, PO2  Studies/Results: No results found.  Anti-infectives: Anti-infectives (From admission, onward)   Start     Dose/Rate Route Frequency Ordered Stop   11/21/16 2000  ciprofloxacin (CIPRO) IVPB 400 mg     400 mg 200 mL/hr over 60 Minutes Intravenous Every 12 hours 11/21/16 1118 11/21/16 2152   11/21/16 0551  ciprofloxacin (CIPRO) 400 MG/200ML IVPB    Comments:  [name]   : cabinet override      11/21/16 0551 11/21/16 0747   11/21/16 0544  ciprofloxacin (CIPRO) IVPB 400 mg     400 mg 200 mL/hr over 60 Minutes Intravenous On call to O.R. 11/21/16 0545 11/21/16 0747      Assessment/Plan: Brittney [name]/p Procedure(Brittney [name]): LAPAROSCOPIC SMALL BOWEL RESECTION (N/A) Advance diet. Start soft diet today ambulate  LOS: 2 days    Brittney Brittney [name],Brittney Brittney [name] 11/23/2016

## 2016-11-24 ENCOUNTER — Encounter (HOSPITAL_COMMUNITY): Payer: Self-pay | Admitting: *Deleted

## 2016-11-24 LAB — BASIC METABOLIC PANEL
Anion gap: 5 (ref 5–15)
BUN: 6 mg/dL (ref 6–20)
CO2: 28 mmol/L (ref 22–32)
Calcium: 8.3 mg/dL — ABNORMAL LOW (ref 8.9–10.3)
Chloride: 106 mmol/L (ref 101–111)
Creatinine, Ser: 0.6 mg/dL (ref 0.44–1.00)
GFR calc Af Amer: >60 mL/min (ref >60)
GFR calc non Af Amer: >60 mL/min (ref >60)
GLUCOSE: 95 mg/dL (ref 65–99)
Potassium: 3.7 mmol/L (ref 3.5–5.1)
SODIUM: 139 mmol/L (ref 135–145)

## 2016-11-24 LAB — CBC
HCT: 27.8 % — ABNORMAL LOW (ref 36.0–46.0)
Hemoglobin: 8 g/dL — ABNORMAL LOW (ref 12.0–15.0)
MCH: 20.2 pg — AB (ref 26.0–34.0)
MCHC: 28.8 g/dL — ABNORMAL LOW (ref 30.0–36.0)
MCV: 70.2 fL — AB (ref 78.0–100.0)
PLATELETS: 218 10*3/uL (ref 150–400)
RBC: 3.96 MIL/uL (ref 3.87–5.11)
RDW: 19 % — ABNORMAL HIGH (ref 11.5–15.5)
WBC: 6.3 10*3/uL (ref 4.0–10.5)

## 2016-11-24 MED ORDER — TRAMADOL HCL 50 MG PO TABS: 50.0000 mg | ORAL_TABLET | Freq: Four times a day (QID) | ORAL | 0 refills | Status: DC | PRN

## 2016-11-24 NOTE — Progress Notes (Signed)
Discharge paperwork review with the patient. Prescription given. Work note given. Questions answered. Patient is ready for discharge.

## 2016-11-24 NOTE — Care Management Note (Signed)
Case Management Note  Patient Details  Name: Brittney [name] MRN: [id number] Date of Birth: 01/09/1965  Subjective/Objective:                 Patient with order to DC to home today. Chart reviewed. No Home Health or Equipment needs, no unacknowledged Case Management consults or medication needs identified at the time of this note. Plan for DC to home.  CM signing off. If new needs arise today prior to discharge, please call Carles Collet RN CM at [phone].   Action/Plan:   Expected Discharge Date:  11/24/16               Expected Discharge Plan:  Home/Self Care  In-House Referral:     Discharge planning Services  CM Consult  Post Acute Care Choice:    Choice offered to:     DME Arranged:    DME Agency:     HH Arranged:    HH Agency:     Status of Service:  Completed, signed off  If discussed at [employer] of Stay Meetings, dates discussed:    Additional Comments:  Carles Collet, RN 11/24/2016, 8:53 AM

## 2016-11-24 NOTE — Progress Notes (Signed)
3 Days Post-Op   Subjective/Chief Complaint: Still having some diarrhea but otherwise feels well. Wants to go home   Objective: Vital signs in last 24 hours: Temp:  [98 F (36.7 C)-98.4 F (36.9 C)] 98.1 F (36.7 C) (11/18 0654) Pulse Rate:  [65-83] 65 (11/18 0654) Resp:  [16-18] 18 (11/18 0654) BP: (90-100)/(55-61) 90/55 (11/18 0654) SpO2:  [98 %-100 %] 100 % (11/18 0654) Last BM Date: 11/23/16  Intake/Output from previous day: 11/17 0701 - 11/18 0700 In: 412.8 [P.O.:300; I.V.:112.8] Out: -  Intake/Output this shift: No intake/output data recorded.  General appearance: alert and cooperative Resp: clear to auscultation bilaterally Cardio: regular rate and rhythm GI: soft, minimal tenderness. incision looks good  Lab Results:  Recent Labs    11/23/16 0358 11/24/16 0402  WBC 6.9 6.3  HGB 8.0* 8.0*  HCT 28.5* 27.8*  PLT 225 218   BMET Recent Labs    11/23/16 0358 11/24/16 0402  NA 139 139  K 4.0 3.7  CL 107 106  CO2 27 28  GLUCOSE 87 95  BUN 5* 6  CREATININE 0.61 0.60  CALCIUM 8.1* 8.3*   PT/INR No results for input(s): LABPROT, INR in the last 72 hours. ABG No results for input(s): PHART, HCO3 in the last 72 hours.  Invalid input(s): PCO2, PO2  Studies/Results: No results found.  Anti-infectives: Anti-infectives (From admission, onward)   Start     Dose/Rate Route Frequency Ordered Stop   11/21/16 2000  ciprofloxacin (CIPRO) IVPB 400 mg     400 mg 200 mL/hr over 60 Minutes Intravenous Every 12 hours 11/21/16 1118 11/21/16 2152   11/21/16 0551  ciprofloxacin (CIPRO) 400 MG/200ML IVPB    Comments:  [name]   : cabinet override      11/21/16 0551 11/21/16 0747   11/21/16 0544  ciprofloxacin (CIPRO) IVPB 400 mg     400 mg 200 mL/hr over 60 Minutes Intravenous On call to O.R. 11/21/16 0545 11/21/16 0747      Assessment/Plan: s/p Procedure(s): LAPAROSCOPIC SMALL BOWEL RESECTION (N/A) Discharge  LOS: 3 days    Brittney [name],Brittney [name]  S 11/24/2016

## 2016-11-30 ENCOUNTER — Inpatient Hospital Stay (HOSPITAL_COMMUNITY)
Admission: EM | Admit: 2016-11-30 | Discharge: 2016-12-06 | DRG: 389 | Disposition: A | Discharge status: Home / Self Care | Payer: BLUE CROSS/BLUE SHIELD | Attending: General Surgery | Admitting: General Surgery

## 2016-11-30 ENCOUNTER — Emergency Department (HOSPITAL_COMMUNITY): Payer: BLUE CROSS/BLUE SHIELD

## 2016-11-30 ENCOUNTER — Encounter (HOSPITAL_COMMUNITY): Payer: Self-pay | Admitting: Nurse Practitioner

## 2016-11-30 DIAGNOSIS — Z91018 Allergy to other foods: Secondary | ICD-10-CM

## 2016-11-30 DIAGNOSIS — R531 Weakness: Secondary | ICD-10-CM | POA: Diagnosis present

## 2016-11-30 DIAGNOSIS — R0789 Other chest pain: Secondary | ICD-10-CM | POA: Diagnosis present

## 2016-11-30 DIAGNOSIS — K567 Ileus, unspecified: Secondary | ICD-10-CM | POA: Diagnosis present

## 2016-11-30 DIAGNOSIS — E8840 Mitochondrial metabolism disorder, unspecified: Secondary | ICD-10-CM | POA: Diagnosis present

## 2016-11-30 DIAGNOSIS — Z885 Allergy status to narcotic agent status: Secondary | ICD-10-CM | POA: Diagnosis not present

## 2016-11-30 DIAGNOSIS — D509 Iron deficiency anemia, unspecified: Secondary | ICD-10-CM | POA: Diagnosis not present

## 2016-11-30 DIAGNOSIS — Z886 Allergy status to analgesic agent status: Secondary | ICD-10-CM | POA: Diagnosis not present

## 2016-11-30 DIAGNOSIS — Z9049 Acquired absence of other specified parts of digestive tract: Secondary | ICD-10-CM | POA: Diagnosis not present

## 2016-11-30 DIAGNOSIS — C779 Secondary and unspecified malignant neoplasm of lymph node, unspecified: Secondary | ICD-10-CM | POA: Diagnosis present

## 2016-11-30 DIAGNOSIS — R1084 Generalized abdominal pain: Secondary | ICD-10-CM

## 2016-11-30 DIAGNOSIS — Z91010 Allergy to peanuts: Secondary | ICD-10-CM | POA: Diagnosis not present

## 2016-11-30 DIAGNOSIS — K56609 Unspecified intestinal obstruction, unspecified as to partial versus complete obstruction: Secondary | ICD-10-CM | POA: Diagnosis present

## 2016-11-30 DIAGNOSIS — Z880 Allergy status to penicillin: Secondary | ICD-10-CM | POA: Diagnosis not present

## 2016-11-30 DIAGNOSIS — Z888 Allergy status to other drugs, medicaments and biological substances status: Secondary | ICD-10-CM | POA: Diagnosis not present

## 2016-11-30 DIAGNOSIS — R112 Nausea with vomiting, unspecified: Secondary | ICD-10-CM

## 2016-11-30 DIAGNOSIS — Z79899 Other long term (current) drug therapy: Secondary | ICD-10-CM | POA: Diagnosis not present

## 2016-11-30 DIAGNOSIS — C787 Secondary malignant neoplasm of liver and intrahepatic bile duct: Secondary | ICD-10-CM | POA: Diagnosis present

## 2016-11-30 DIAGNOSIS — C7A8 Other malignant neuroendocrine tumors: Secondary | ICD-10-CM | POA: Diagnosis present

## 2016-11-30 DIAGNOSIS — C7B8 Other secondary neuroendocrine tumors: Secondary | ICD-10-CM | POA: Diagnosis not present

## 2016-11-30 LAB — URINALYSIS, ROUTINE W REFLEX MICROSCOPIC
Bilirubin Urine: NEGATIVE
GLUCOSE, UA: NEGATIVE mg/dL
HGB URINE DIPSTICK: NEGATIVE
Ketones, ur: 20 mg/dL — AB
LEUKOCYTES UA: NEGATIVE
NITRITE: NEGATIVE
PH: 5 (ref 5.0–8.0)
Protein, ur: 100 mg/dL — AB
SPECIFIC GRAVITY, URINE: 1.027 (ref 1.005–1.030)

## 2016-11-30 LAB — CBC WITH DIFFERENTIAL/PLATELET
BASOS ABS: 0 10*3/uL (ref 0.0–0.1)
BASOS PCT: 0 %
EOS ABS: 0 10*3/uL (ref 0.0–0.7)
Eosinophils Relative: 0 %
HEMATOCRIT: 33.9 % — AB (ref 36.0–46.0)
HEMOGLOBIN: 10.2 g/dL — AB (ref 12.0–15.0)
LYMPHS PCT: 12 %
Lymphs Abs: 1 10*3/uL (ref 0.7–4.0)
MCH: 20.5 pg — ABNORMAL LOW (ref 26.0–34.0)
MCHC: 30.1 g/dL (ref 30.0–36.0)
MCV: 68.1 fL — ABNORMAL LOW (ref 78.0–100.0)
MONOS PCT: 15 %
Monocytes Absolute: 1.3 10*3/uL — ABNORMAL HIGH (ref 0.1–1.0)
NEUTROS ABS: 6.4 10*3/uL (ref 1.7–7.7)
NEUTROS PCT: 73 %
PLATELETS: 312 10*3/uL (ref 150–400)
RBC: 4.98 MIL/uL (ref 3.87–5.11)
RDW: 17.7 % — AB (ref 11.5–15.5)
WBC: 8.7 10*3/uL (ref 4.0–10.5)

## 2016-11-30 LAB — COMPREHENSIVE METABOLIC PANEL
ALBUMIN: 3.5 g/dL (ref 3.5–5.0)
ALT: 18 U/L (ref 14–54)
ANION GAP: 8 (ref 5–15)
AST: 20 U/L (ref 15–41)
Alkaline Phosphatase: 84 U/L (ref 38–126)
BUN: 13 mg/dL (ref 6–20)
CALCIUM: 9.1 mg/dL (ref 8.9–10.3)
CO2: 29 mmol/L (ref 22–32)
Chloride: 100 mmol/L — ABNORMAL LOW (ref 101–111)
Creatinine, Ser: 0.53 mg/dL (ref 0.44–1.00)
GFR calc non Af Amer: >60 mL/min (ref >60)
GLUCOSE: 122 mg/dL — AB (ref 65–99)
POTASSIUM: 3.5 mmol/L (ref 3.5–5.1)
SODIUM: 137 mmol/L (ref 135–145)
Total Bilirubin: 0.8 mg/dL (ref 0.3–1.2)
Total Protein: 7.1 g/dL (ref 6.5–8.1)

## 2016-11-30 MED ORDER — KCL-LACTATED RINGERS-D5W 20 MEQ/L IV SOLN
INTRAVENOUS | Status: DC
  Administered 2016-11-30 – 2016-12-05 (×7): via INTRAVENOUS
  Filled 2016-11-30 (×12): qty 1000

## 2016-11-30 MED ORDER — KETOROLAC TROMETHAMINE 15 MG/ML IJ SOLN
15.0000 mg | Freq: Once | INTRAMUSCULAR | Status: AC
  Administered 2016-11-30: 15 mg via INTRAVENOUS
  Filled 2016-11-30: qty 1

## 2016-11-30 MED ORDER — ONDANSETRON HCL 4 MG/2ML IJ SOLN
4.0000 mg | Freq: Four times a day (QID) | INTRAMUSCULAR | Status: DC | PRN
  Administered 2016-12-02: 4 mg via INTRAVENOUS
  Filled 2016-11-30: qty 2

## 2016-11-30 MED ORDER — SODIUM CHLORIDE 0.9 % IV BOLUS (SEPSIS)
500.0000 mL | Freq: Once | INTRAVENOUS | Status: AC
  Administered 2016-11-30: 500 mL via INTRAVENOUS

## 2016-11-30 MED ORDER — FENTANYL CITRATE (PF) 100 MCG/2ML IJ SOLN
100.0000 ug | INTRAMUSCULAR | Status: DC | PRN
  Filled 2016-11-30: qty 2

## 2016-11-30 MED ORDER — SODIUM CHLORIDE 0.9 % IV SOLN
INTRAVENOUS | Status: DC
Start: 2016-11-30 — End: 2016-11-30
  Administered 2016-11-30: 17:00:00 via INTRAVENOUS

## 2016-11-30 MED ORDER — ENOXAPARIN SODIUM 40 MG/0.4ML ~~LOC~~ SOLN
40.0000 mg | SUBCUTANEOUS | Status: DC
  Administered 2016-11-30 – 2016-12-05 (×6): 40 mg via SUBCUTANEOUS
  Filled 2016-11-30 (×7): qty 0.4

## 2016-11-30 MED ORDER — LORAZEPAM BOLUS VIA INFUSION: 0.5000 mg | Freq: Every evening | INTRAVENOUS | Status: DC | PRN

## 2016-11-30 MED ORDER — ONDANSETRON HCL 4 MG/2ML IJ SOLN
4.0000 mg | Freq: Once | INTRAMUSCULAR | Status: AC
  Administered 2016-11-30: 4 mg via INTRAVENOUS
  Filled 2016-11-30: qty 2

## 2016-11-30 MED ORDER — IOPAMIDOL (ISOVUE-300) INJECTION 61%
INTRAVENOUS | Status: AC
  Administered 2016-11-30: 100 mL via INTRAVENOUS
  Filled 2016-11-30: qty 100

## 2016-11-30 MED ORDER — ONDANSETRON 4 MG PO TBDP: 4.0000 mg | ORAL_TABLET | Freq: Four times a day (QID) | ORAL | Status: DC | PRN

## 2016-11-30 MED ORDER — KETOROLAC TROMETHAMINE 15 MG/ML IJ SOLN
15.0000 mg | Freq: Four times a day (QID) | INTRAMUSCULAR | Status: AC | PRN
  Administered 2016-11-30 – 2016-12-05 (×17): 15 mg via INTRAVENOUS
  Filled 2016-11-30 (×17): qty 1

## 2016-11-30 MED ORDER — MORPHINE SULFATE (PF) 4 MG/ML IV SOLN: 2.0000 mg | INTRAVENOUS | Status: DC | PRN

## 2016-11-30 MED ORDER — PANTOPRAZOLE SODIUM 40 MG IV SOLR
40.0000 mg | Freq: Every day | INTRAVENOUS | Status: DC
  Administered 2016-11-30 – 2016-12-04 (×5): 40 mg via INTRAVENOUS
  Filled 2016-11-30 (×5): qty 40

## 2016-11-30 MED ORDER — LORAZEPAM 2 MG/ML IJ SOLN
0.5000 mg | Freq: Every evening | INTRAMUSCULAR | Status: DC | PRN
  Administered 2016-11-30 – 2016-12-04 (×5): 0.5 mg via INTRAVENOUS
  Filled 2016-11-30 (×5): qty 1

## 2016-11-30 NOTE — ED Triage Notes (Signed)
Pt presents with diffuse abdominal pain. N/V post bowel resection surgery part of cancer treatment she is undergoing. Symptoms onset she reports yesterday morning, ensued through to today. Called on-call surgery group and was advised to come in for further eval.

## 2016-11-30 NOTE — H&P (Signed)
Brittney [name] is an 51 y.o. female.    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  She underwent laparoscopic small bowel resection on November 21, 2016.  Patient's initial postoperative course was uncomplicated and she was discharged home on 11/24/2016 doing well.  She had advanced to a regular diet but 2 days ago developed the onset of crampy abdominal pain followed by frequent nausea and vomiting.  She stopped eating and the pain seemed to get better.  Today she again tried some liquids and soft food and developed again fairly severe crampy abdominal pain associated with vomiting.  Some distention.  No bowel movements in 2 days.  No fever or chills.  She called for advice and I asked her to come to the emergency department for evaluation.  Past Medical History:  Diagnosis Date  . 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 Disorder of sulfur-bearing amino-acid metabolism (HCC)    pateint denies  . Family history of sudden cardiac death    patient has two cousins that died from MI  . Heart palpitations    medication induced about 2 years has not had any problem since  . Iron deficiency anemia   . Magnesium deficiency    patient denies  . 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 Mixed hyperlipidemia   . Premenstrual tension syndrome    denies  . Vitamin D deficiency     Past Surgical History:  Procedure Laterality Date  . BOWEL RESECTION N/A 11/21/2016   Procedure: LAPAROSCOPIC SMALL BOWEL RESECTION;  Surgeon: [name], MD;  Location: [place] [facility];  Service: General;  Laterality: N/A;  . BREAST SURGERY  2005   lumphectomy begin  . CESAREAN SECTION    . COLON SURGERY    . COLONOSCOPY    . WISDOM  TOOTH EXTRACTION      Family History  Problem Relation Age of Onset  . Sudden Cardiac Death Cousin 38       Female (maternal)  . Sudden Cardiac Death Cousin 2       Female (paternal)   . Cancer Mother        colon  . Cancer Father        lung   Social History:  reports that  has never smoked. she has never used smokeless tobacco. She reports that she drinks alcohol. She reports that she does not use drugs.  Allergies:  Allergies  Allergen Reactions  . Other Anaphylaxis and Hives    Tree nuts Peanuts  . Peanut-Containing Drug Products Anaphylaxis  . Trilyte [Peg 3350-Kcl-Na Bicarb-Nacl] Other (See Comments)    Throwing up  . Penicillins Other (See Comments)    "childhood allergy"  Has patient had a PCN reaction causing immediate rash, facial/tongue/throat swelling, SOB or lightheadedness with hypotension: unkn Has patient had a PCN reaction that required hospitalization: unkn Has patient had a PCN reaction occurring within the last 10 years: unkn If all of the above answers are "NO", then may proceed with Cephalosporin use.   Marland Kitchen Percocet [Oxycodone-Acetaminophen] Nausea And Vomiting   Current Facility-Administered Medications  Medication Dose Route Frequency Provider Last Rate Last Dose  . 0.9 %  sodium chloride infusion   Intravenous Continuous Daleen Bo,  MD 125 mL/hr at 11/30/16 1647    . fentaNYL (SUBLIMAZE) injection 100 mcg  100 mcg Intravenous Q30 min PRN Daleen Bo, MD       Current Outpatient Medications  Medication Sig Dispense Refill  . IRON PO Take 75 mg 2 (two) times daily by mouth.    Marland Kitchen LORazepam (ATIVAN) 0.5 MG tablet Take 1 tablet (0.5 mg total) by mouth at bedtime as needed for anxiety or sleep. 30 tablet 2  . Nutritional Supplements (JUICE PLUS FIBRE PO) Take 2 each by mouth daily. [employer]    . Nutritional Supplements (JUICE PLUS FIBRE PO) Take 2 capsules by mouth daily. [employer]    . Nutritional Supplements (JUICE PLUS FIBRE PO) Take 2  each by mouth daily. [employer]    . traMADol (ULTRAM) 50 MG tablet Take 1 tablet (50 mg total) every 6 (six) hours as needed by mouth (mild pain). 30 tablet 0     Results for orders placed or performed during the hospital encounter of 11/30/16 (from the past 48 hour(s))  Urinalysis, Routine w reflex microscopic     Status: Abnormal   Collection Time: 11/30/16  3:55 PM  Result Value Ref Range   Color, Urine YELLOW YELLOW   APPearance HAZY (A) CLEAR   Specific Gravity, Urine 1.027 1.005 - 1.030   pH 5.0 5.0 - 8.0   Glucose, UA NEGATIVE NEGATIVE mg/dL   Hgb urine dipstick NEGATIVE NEGATIVE   Bilirubin Urine NEGATIVE NEGATIVE   Ketones, ur 20 (A) NEGATIVE mg/dL   Protein, ur 100 (A) NEGATIVE mg/dL   Nitrite NEGATIVE NEGATIVE   Leukocytes, UA NEGATIVE NEGATIVE   RBC / HPF 0-5 0 - 5 RBC/hpf   WBC, UA 0-5 0 - 5 WBC/hpf   Bacteria, UA RARE (A) NONE SEEN   Squamous Epithelial / LPF 0-5 (A) NONE SEEN   Mucus PRESENT   Comprehensive metabolic panel     Status: Abnormal   Collection Time: 11/30/16  4:13 PM  Result Value Ref Range   Sodium 137 135 - 145 mmol/L   Potassium 3.5 3.5 - 5.1 mmol/L   Chloride 100 (L) 101 - 111 mmol/L   CO2 29 22 - 32 mmol/L   Glucose, Bld 122 (H) 65 - 99 mg/dL   BUN 13 6 - 20 mg/dL   Creatinine, Ser 0.53 0.44 - 1.00 mg/dL   Calcium 9.1 8.9 - 10.3 mg/dL   Total Protein 7.1 6.5 - 8.1 g/dL   Albumin 3.5 3.5 - 5.0 g/dL   AST 20 15 - 41 U/L   ALT 18 14 - 54 U/L   Alkaline Phosphatase 84 38 - 126 U/L   Total Bilirubin 0.8 0.3 - 1.2 mg/dL   GFR calc non Af Amer >60 >60 mL/min   GFR calc Af Amer >60 >60 mL/min    Comment: (NOTE) The eGFR has been calculated using the CKD EPI equation. This calculation has not been validated in all clinical situations. eGFR's persistently <60 mL/min signify possible Chronic Kidney Disease.    Anion gap 8 5 - 15  CBC with Differential     Status: Abnormal   Collection Time: 11/30/16  4:13 PM  Result Value Ref Range    WBC 8.7 4.0 - 10.5 K/uL   RBC 4.98 3.87 - 5.11 MIL/uL   Hemoglobin 10.2 (L) 12.0 - 15.0 g/dL   HCT 33.9 (L) 36.0 - 46.0 %   MCV 68.1 (L) 78.0 - 100.0 fL   MCH 20.5 (L)  26.0 - 34.0 pg   MCHC 30.1 30.0 - 36.0 g/dL   RDW 17.7 (H) 11.5 - 15.5 %   Platelets 312 150 - 400 K/uL   Neutrophils Relative % 73 %   Lymphocytes Relative 12 %   Monocytes Relative 15 %   Eosinophils Relative 0 %   Basophils Relative 0 %   Neutro Abs 6.4 1.7 - 7.7 K/uL   Lymphs Abs 1.0 0.7 - 4.0 K/uL   Monocytes Absolute 1.3 (H) 0.1 - 1.0 K/uL   Eosinophils Absolute 0.0 0.0 - 0.7 K/uL   Basophils Absolute 0.0 0.0 - 0.1 K/uL   RBC Morphology POLYCHROMASIA PRESENT    Ct Abdomen Pelvis W Contrast  Result Date: 11/30/2016 CLINICAL DATA:  51 year old female with history of abdominal discomfort. Status post small bowel resection for intestinal tumor. Recent history of abdominal pain intermittently with some persistent vomiting for the past 3 days. EXAM: CT ABDOMEN AND PELVIS WITH CONTRAST TECHNIQUE: Multidetector CT imaging of the abdomen and pelvis was performed using the standard protocol following bolus administration of intravenous contrast. CONTRAST:  <See Chart> ISOVUE-300 IOPAMIDOL (ISOVUE-300) INJECTION 61% COMPARISON:  PET-CT 10/23/2016.  MRI of the abdomen 10/02/2016. FINDINGS: Lower chest: Mild scarring or subsegmental atelectasis in the lower lobes of lungs bilaterally. Hepatobiliary: There are again multiple well-defined heterogeneously enhancing hepatic lesions scattered throughout the liver which appear generally similar in size, number and distribution compared to prior MRI 10/02/2016. The largest of these lesions is located in segment 7 (axial image 13 of series 2) measuring 3.3 x 3.0 cm. No definite new lesions are noted. No intra or extrahepatic biliary ductal dilatation. Gallbladder is normal in appearance. Pancreas: No pancreatic mass. No pancreatic ductal dilatation. No pancreatic or peripancreatic fluid or  inflammatory changes. Spleen: Unremarkable. Adrenals/Urinary Tract: Well-defined 3.1 cm simple cyst in the interpolar region of the left kidney. Right kidney and bilateral adrenal glands are normal in appearance. No hydroureteronephrosis. Urinary bladder is unremarkable in appearance. Stomach/Bowel: The appearance of the stomach is normal. There is extensive dilatation of the small bowel which is predominantly throughout the proximal to mid small bowel with sparing of the distal ileum. Maximum small bowel dilatation is approximately 4.4 cm. Multiple air-fluid levels are noted. There is a potential transition point in the low anterior anatomic pelvis adjacent to a suture line, best appreciated on axial image 69 of series 2 and coronal image 34 of series 4, suspicious for a stricture. Distal ileum beyond this point is non-dilated, and other than the cecum and ascending colon which are normally distended the remainder the colon is otherwise completely decompressed. Vascular/Lymphatic: No atherosclerotic disease, aneurysm or dissection noted in the abdominal or pelvic vasculature. No lymphadenopathy noted in the abdomen or pelvis. Reproductive: IUD present in the uterus. Ovaries are normal in appearance. Other: Small volume of ascites in the low anatomic pelvis and adjacent to the spleen. No pneumoperitoneum. Musculoskeletal: There are no aggressive appearing lytic or blastic lesions noted in the visualized portions of the skeleton. IMPRESSION: 1. Findings are compatible with small bowel obstruction with apparent transition point adjacent to a suture line in the distal small bowel, likely in the mid ileum. 2. Metastatic lesions throughout the liver appear similar in size, number and distribution to prior studies. 3. Previously noted mesenteric masses have been resected. No new mesenteric lesions are identified on today's examination. 4. Small volume of ascites. 5. Additional incidental findings, as above. Electronically  Signed   By: Vinnie Langton M.D.   On:  11/30/2016 17:54    Review of Systems  Constitutional: Positive for malaise/fatigue. Negative for chills and fever.  Respiratory: Negative.   Cardiovascular: Negative.   Gastrointestinal: Positive for abdominal pain, nausea and vomiting. Negative for constipation and diarrhea.  Genitourinary: Negative.     Blood pressure 101/73, pulse 89, temperature 98 F (36.7 C), temperature source Oral, resp. rate 14, height '5\' 5"'  (1.651 m), weight 58.1 kg (128 lb), last menstrual period 01/31/2014, SpO2 98 %. Physical Exam  General: Alert, well-developed Caucasian female who appears somewhat uncomfortable. Skin: Warm and dry without rash or infection. HEENT: No palpable masses or thyromegaly. Sclera nonicteric. Pupils equal round and reactive.  Lymph nodes: No cervical, supraclavicularnodes palpable. Lungs: Breath sounds clear and equal without increased work of breathing Cardiovascular: Regular rate and rhythm without murmur. No JVD or edema.  Abdomen: Mild distention.  Healing midline incision periumbilical with some ecchymosis and some swelling but no definite mass.  No organomegaly. No palpable hernias. Extremities: No edema or joint swelling or deformity. No chronic venous stasis changes. Neurologic: Alert and fully oriented. Gait normal.  Assessment/Plan Patient with recent resection of metastatic carcinoid tumor in the ileum with early postoperative small bowel obstruction which appears to be at the level of the anastomosis.  Possible anastomotic inflammation or edema.  No evidence of leak or infection.  Patient will be admitted for bowel rest, IV fluids, pain and nausea control and close observation.  We will place an NG if she has any further nausea and vomiting.  Findings were discussed with the patient and her husband and all questions answered.  Edward Jolly, MD 11/30/2016, 6:34 PM

## 2016-11-30 NOTE — ED Provider Notes (Signed)
[facility] DEPT Provider Note   CSN: [id number] Arrival date & time: 11/30/16  1427     History   Chief Complaint Chief Complaint  Patient presents with  . Post-op Problem  . Abdominal Pain    HPI Brittney [name] is a 51 y.o. female. The patient was seen earlier by Dr. Lenna Gilford work in the emergency department who asked me to evaluate the patient and initiate the workup.  The patient presents for abdominal discomfort.  She had a recent small bowel resection for intestinal tumor.  She complains of abdominal pain on and off, with the persistent vomiting consisting of food that she ate, for 3 days.  No fever.  Last bowel movement yesterday.  She does not think she passed any flatus today.  She denies cough, shortness of breath, near-syncope or paresthesia.  She feels overall weak.  There are no other known modifying factors.  HPI  Past Medical History:  Diagnosis Date  . 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 Disorder of sulfur-bearing amino-acid metabolism (HCC)    pateint denies  . Family history of sudden cardiac death    patient has two cousins that died from MI  . Heart palpitations    medication induced about 2 years has not had any problem since  . Iron deficiency anemia   . Magnesium deficiency    patient denies  . 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 Mixed hyperlipidemia   . Premenstrual tension syndrome    denies  . Vitamin D deficiency     Patient Active Problem List   Diagnosis Date Noted  . Carcinoid tumor of small intestine, malignant ([place]) 11/21/2016    Past Surgical History:  Procedure Laterality Date  . BOWEL RESECTION N/A 11/21/2016   Procedure: LAPAROSCOPIC SMALL BOWEL RESECTION;  Surgeon: [name], MD;  Location: [facility];  Service: General;  Laterality: N/A;  . BREAST SURGERY   2005   lumphectomy begin  . CESAREAN SECTION    . COLON SURGERY    . COLONOSCOPY    . WISDOM TOOTH EXTRACTION      OB History    No data available       Home Medications    Prior to Admission medications   Medication Sig Start Date End Date Taking? Authorizing Provider  IRON PO Take 75 mg 2 (two) times daily by mouth.    [provider]  LORazepam (ATIVAN) 0.5 MG tablet Take 1 tablet (0.5 mg total) by mouth at bedtime as needed for anxiety or sleep. 10/28/16   Ladell Pier, MD  Nutritional Supplements (JUICE PLUS FIBRE PO) Take 2 each by mouth daily. [employer], Historical, MD  Nutritional Supplements (JUICE PLUS FIBRE PO) Take 2 capsules by mouth daily. [employer], Historical, MD  Nutritional Supplements (JUICE PLUS FIBRE PO) Take 2 each by mouth daily. [employer], Historical, MD  traMADol (ULTRAM) 50 MG tablet Take 1 tablet (50 mg total) every 6 (six) hours as needed by mouth (mild pain). 11/24/16   Jovita Kussmaul, MD    Family History Family History  Problem Relation Age of Onset  . Sudden Cardiac Death Cousin 62       Female (maternal)  . Sudden Cardiac Death Cousin 79  Female (paternal)   . Cancer Mother        colon  . Cancer Father        lung    Social History Social History   Tobacco Use  . Smoking status: Never Smoker  . Smokeless tobacco: Never Used  Substance Use Topics  . Alcohol use: Yes    Comment: social/ 1 glass wine daily  . Drug use: No     Allergies   Other; Peanut-containing drug products; Trilyte [peg 3350-kcl-na bicarb-nacl]; Penicillins; and Percocet [oxycodone-acetaminophen]   Review of Systems Review of Systems  All other systems reviewed and are negative.    Physical Exam Updated Vital Signs BP 98/75 (BP Location: Right Arm)   Pulse (!) 101   Temp 98 F (36.7 C) (Oral)   Resp 18   Ht 5\' 5"  (1.829 m)   Wt 58.1 kg (128 lb)   LMP 01/31/2014   SpO2 98%   BMI  21.30 kg/m   Physical Exam  Constitutional: She is oriented to person, place, and time. She appears well-developed and well-nourished.  HENT:  Head: Normocephalic and atraumatic.  Eyes: Conjunctivae and EOM are normal. Pupils are equal, round, and reactive to light.  Neck: Normal range of motion and phonation normal. Neck supple.  Cardiovascular: Normal rate and regular rhythm.  Pulmonary/Chest: Effort normal and breath sounds normal. She exhibits no tenderness.  Abdominal: Soft. She exhibits no distension. There is tenderness (Diffuse, mild). There is no guarding.  Healing laparoscopic surgical incisions, without drainage, bleeding or dehiscence.  Musculoskeletal: Normal range of motion.  Neurological: She is alert and oriented to person, place, and time. She exhibits normal muscle tone.  Skin: Skin is warm and dry.  Psychiatric: She has a normal mood and affect. Her behavior is normal. Judgment and thought content normal.  Nursing note and vitals reviewed.    ED Treatments / Results  Labs (all labs ordered are listed, but only abnormal results are displayed) Labs Reviewed  COMPREHENSIVE METABOLIC PANEL  CBC WITH DIFFERENTIAL/PLATELET  URINALYSIS, ROUTINE W REFLEX MICROSCOPIC    EKG  EKG Interpretation None       Radiology No results found.  Procedures Procedures (including critical care time)  Medications Ordered in ED Medications  ondansetron (ZOFRAN) injection 4 mg (not administered)  0.9 %  sodium chloride infusion (not administered)  sodium chloride 0.9 % bolus 500 mL (500 mLs Intravenous New Bag/Given 11/30/16 1618)     Initial Impression / Assessment and Plan / ED Course  I have reviewed the triage vital signs and the nursing notes.  Pertinent labs & imaging results that were available during my care of the patient were reviewed by me and considered in my medical decision making (see chart for details).     Patient Vitals for the past 24 hrs:  BP  Temp Temp src Pulse Resp SpO2 Height Weight  11/30/16 1443 98/75 98 F (36.7 C) Oral (!) 101 18 98 % 5\' 5"  (1.651 m) 58.1 kg (128 lb)       Final Clinical Impressions(s) / ED Diagnoses   Final diagnoses:  Generalized abdominal pain  Nausea and vomiting, intractability of vomiting not specified, unspecified vomiting type    Nausea and vomiting following surgical procedure, resection of small bowel neuroendocrine tumor, on 11/21/16.  Pathology report at that time was remarkable for low-grade neuroendocrine tumor extending to serosal surface, and both metastatic lymph nodes and satellite nodule of low-grade neuroendocrine tumor.  Nursing Notes Reviewed/ Care Coordinated Applicable  Imaging Reviewed Interpretation of Laboratory Data incorporated into ED treatment   Plan-care to oncoming provider, Dr. Tyrone Nine to evaluate after imaging and contact on-call surgeon, Dr. Excell Seltzer for disposition  ED Discharge Orders    None       Daleen Bo, MD 11/30/16 1625

## 2016-11-30 NOTE — ED Notes (Signed)
ED TO INPATIENT HANDOFF REPORT  Name/Age/Gender Brittney [name] 51 y.o. female  Code Status Code Status History    Date Active Date Inactive Code Status Order ID Comments User Context   11/21/2016 11:18 11/24/2016 13:38 Full Code [id number]  [name], MD Inpatient      Home/SNF/Other Home  Chief Complaint vomiting,abd pain,postop?  Level of Care/Admitting Diagnosis ED Disposition    ED Disposition Condition Comment   Admit  Hospital Area: [facility] [[id number]]  Level of Care: Med-Surg [16]  Diagnosis: SBO (small bowel obstruction) ([place]) [696789]  Admitting Physician: Lewis Shock  Attending Physician: [name] [[id number]]  Estimated length of stay: 3 - 4 days  Certification:: I certify this patient will need inpatient services for at least 2 midnights  PT Class (Do Not Modify): Inpatient [101]  PT Acc Code (Do Not Modify): Private [1]       Medical History Past Medical History:  Diagnosis Date  . 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 Disorder of sulfur-bearing amino-acid metabolism (HCC)    pateint denies  . Family history of sudden cardiac death    patient has two cousins that died from MI  . Heart palpitations    medication induced about 2 years has not had any problem since  . Iron deficiency anemia   . Magnesium deficiency    patient denies  . 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)    patient is not aware  . Mixed hyperlipidemia   . Premenstrual tension syndrome    denies  . Vitamin D deficiency     Allergies Allergies  Allergen Reactions  . Other Anaphylaxis and Hives    Tree nuts Peanuts  . Peanut-Containing Drug Products Anaphylaxis  . Trilyte [Peg 3350-Kcl-Na Bicarb-Nacl] Other (See Comments)    Throwing up  . Penicillins Other (See Comments)    "childhood allergy"  Has patient had a PCN reaction  causing immediate rash, facial/tongue/throat swelling, SOB or lightheadedness with hypotension: unkn Has patient had a PCN reaction that required hospitalization: unkn Has patient had a PCN reaction occurring within the last 10 years: unkn If all of the above answers are "NO", then may proceed with Cephalosporin use.   Marland Kitchen Percocet [Oxycodone-Acetaminophen] Nausea And Vomiting    IV Location/Drains/Wounds Patient Lines/Drains/Airways Status   Active Line/Drains/Airways    Name:   Placement date:   Placement time:   Site:   Days:   Peripheral IV 11/30/16 Left Antecubital   11/30/16    1619    Antecubital   less than 1   Incision (Closed) 10/07/16 Abdomen   10/07/16    0915     54   Incision (Closed) 11/21/16 Abdomen Other (Comment)   11/21/16    0902     9   Incision - 1 Port Abdomen 1: Umbilicus   38/10/17    5102     9   Incision - 3 Ports Abdomen 1: Left;Upper 2: Left;Mid 3: Left;Lower   11/21/16    0802     9          Labs/Imaging Results for orders placed or performed during the hospital encounter of 11/30/16 (from the past 48 hour(s))  Urinalysis, Routine w reflex microscopic     Status: Abnormal   Collection Time: 11/30/16  3:55 PM  Result Value Ref Range   Color, Urine YELLOW YELLOW  APPearance HAZY (A) CLEAR   Specific Gravity, Urine 1.027 1.005 - 1.030   pH 5.0 5.0 - 8.0   Glucose, UA NEGATIVE NEGATIVE mg/dL   Hgb urine dipstick NEGATIVE NEGATIVE   Bilirubin Urine NEGATIVE NEGATIVE   Ketones, ur 20 (A) NEGATIVE mg/dL   Protein, ur 100 (A) NEGATIVE mg/dL   Nitrite NEGATIVE NEGATIVE   Leukocytes, UA NEGATIVE NEGATIVE   RBC / HPF 0-5 0 - 5 RBC/hpf   WBC, UA 0-5 0 - 5 WBC/hpf   Bacteria, UA RARE (A) NONE SEEN   Squamous Epithelial / LPF 0-5 (A) NONE SEEN   Mucus PRESENT   Comprehensive metabolic panel     Status: Abnormal   Collection Time: 11/30/16  4:13 PM  Result Value Ref Range   Sodium 137 135 - 145 mmol/L   Potassium 3.5 3.5 - 5.1 mmol/L   Chloride 100 (L)  101 - 111 mmol/L   CO2 29 22 - 32 mmol/L   Glucose, Bld 122 (H) 65 - 99 mg/dL   BUN 13 6 - 20 mg/dL   Creatinine, Ser 0.53 0.44 - 1.00 mg/dL   Calcium 9.1 8.9 - 10.3 mg/dL   Total Protein 7.1 6.5 - 8.1 g/dL   Albumin 3.5 3.5 - 5.0 g/dL   AST 20 15 - 41 U/L   ALT 18 14 - 54 U/L   Alkaline Phosphatase 84 38 - 126 U/L   Total Bilirubin 0.8 0.3 - 1.2 mg/dL   GFR calc non Af Amer >60 >60 mL/min   GFR calc Af Amer >60 >60 mL/min    Comment: (NOTE) The eGFR has been calculated using the CKD EPI equation. This calculation has not been validated in all clinical situations. eGFR's persistently <60 mL/min signify possible Chronic Kidney Disease.    Anion gap 8 5 - 15  CBC with Differential     Status: Abnormal   Collection Time: 11/30/16  4:13 PM  Result Value Ref Range   WBC 8.7 4.0 - 10.5 K/uL   RBC 4.98 3.87 - 5.11 MIL/uL   Hemoglobin 10.2 (L) 12.0 - 15.0 g/dL   HCT 33.9 (L) 36.0 - 46.0 %   MCV 68.1 (L) 78.0 - 100.0 fL   MCH 20.5 (L) 26.0 - 34.0 pg   MCHC 30.1 30.0 - 36.0 g/dL   RDW 17.7 (H) 11.5 - 15.5 %   Platelets 312 150 - 400 K/uL   Neutrophils Relative % 73 %   Lymphocytes Relative 12 %   Monocytes Relative 15 %   Eosinophils Relative 0 %   Basophils Relative 0 %   Neutro Abs 6.4 1.7 - 7.7 K/uL   Lymphs Abs 1.0 0.7 - 4.0 K/uL   Monocytes Absolute 1.3 (H) 0.1 - 1.0 K/uL   Eosinophils Absolute 0.0 0.0 - 0.7 K/uL   Basophils Absolute 0.0 0.0 - 0.1 K/uL   RBC Morphology POLYCHROMASIA PRESENT    Ct Abdomen Pelvis W Contrast  Result Date: 11/30/2016 CLINICAL DATA:  51 year old female with history of abdominal discomfort. Status post small bowel resection for intestinal tumor. Recent history of abdominal pain intermittently with some persistent vomiting for the past 3 days. EXAM: CT ABDOMEN AND PELVIS WITH CONTRAST TECHNIQUE: Multidetector CT imaging of the abdomen and pelvis was performed using the standard protocol following bolus administration of intravenous contrast.  CONTRAST:  <See Chart> ISOVUE-300 IOPAMIDOL (ISOVUE-300) INJECTION 61% COMPARISON:  PET-CT 10/23/2016.  MRI of the abdomen 10/02/2016. FINDINGS: Lower chest: Mild scarring or subsegmental atelectasis in the  lower lobes of lungs bilaterally. Hepatobiliary: There are again multiple well-defined heterogeneously enhancing hepatic lesions scattered throughout the liver which appear generally similar in size, number and distribution compared to prior MRI 10/02/2016. The largest of these lesions is located in segment 7 (axial image 13 of series 2) measuring 3.3 x 3.0 cm. No definite new lesions are noted. No intra or extrahepatic biliary ductal dilatation. Gallbladder is normal in appearance. Pancreas: No pancreatic mass. No pancreatic ductal dilatation. No pancreatic or peripancreatic fluid or inflammatory changes. Spleen: Unremarkable. Adrenals/Urinary Tract: Well-defined 3.1 cm simple cyst in the interpolar region of the left kidney. Right kidney and bilateral adrenal glands are normal in appearance. No hydroureteronephrosis. Urinary bladder is unremarkable in appearance. Stomach/Bowel: The appearance of the stomach is normal. There is extensive dilatation of the small bowel which is predominantly throughout the proximal to mid small bowel with sparing of the distal ileum. Maximum small bowel dilatation is approximately 4.4 cm. Multiple air-fluid levels are noted. There is a potential transition point in the low anterior anatomic pelvis adjacent to a suture line, best appreciated on axial image 69 of series 2 and coronal image 34 of series 4, suspicious for a stricture. Distal ileum beyond this point is non-dilated, and other than the cecum and ascending colon which are normally distended the remainder the colon is otherwise completely decompressed. Vascular/Lymphatic: No atherosclerotic disease, aneurysm or dissection noted in the abdominal or pelvic vasculature. No lymphadenopathy noted in the abdomen or pelvis.  Reproductive: IUD present in the uterus. Ovaries are normal in appearance. Other: Small volume of ascites in the low anatomic pelvis and adjacent to the spleen. No pneumoperitoneum. Musculoskeletal: There are no aggressive appearing lytic or blastic lesions noted in the visualized portions of the skeleton. IMPRESSION: 1. Findings are compatible with small bowel obstruction with apparent transition point adjacent to a suture line in the distal small bowel, likely in the mid ileum. 2. Metastatic lesions throughout the liver appear similar in size, number and distribution to prior studies. 3. Previously noted mesenteric masses have been resected. No new mesenteric lesions are identified on today's examination. 4. Small volume of ascites. 5. Additional incidental findings, as above. Electronically Signed   By: Vinnie Langton M.D.   On: 11/30/2016 17:54    Pending Labs Unresulted Labs (From admission, onward)   None      Vitals/Pain Today's Vitals   11/30/16 1715 11/30/16 1731 11/30/16 1900 11/30/16 1924  BP: 101/73  103/65 103/65  Pulse: 89  89 89  Resp: 14   14  Temp:      TempSrc:      SpO2: 98%   98%  Weight:      Height:      PainSc:  5       Isolation Precautions No active isolations  Medications Medications  0.9 %  sodium chloride infusion ( Intravenous New Bag/Given 11/30/16 1647)  fentaNYL (SUBLIMAZE) injection 100 mcg (not administered)  sodium chloride 0.9 % bolus 500 mL (0 mLs Intravenous Stopped 11/30/16 1639)  ondansetron (ZOFRAN) injection 4 mg (4 mg Intravenous Given 11/30/16 1622)  ketorolac (TORADOL) 15 MG/ML injection 15 mg (15 mg Intravenous Given 11/30/16 1714)  iopamidol (ISOVUE-300) 61 % injection (100 mLs Intravenous Contrast Given 11/30/16 1719)    Mobility walks

## 2016-11-30 NOTE — ED Notes (Signed)
  Pt transported to ct 

## 2016-11-30 NOTE — ED Provider Notes (Signed)
The patient was seen primarily, by general surgery, Dr. Excell Seltzer, in the emergency department, today   Brittney Bo, MD 11/30/16 1547

## 2016-12-01 ENCOUNTER — Inpatient Hospital Stay (HOSPITAL_COMMUNITY): Payer: BLUE CROSS/BLUE SHIELD

## 2016-12-01 ENCOUNTER — Other Ambulatory Visit: Payer: Self-pay

## 2016-12-01 LAB — GLUCOSE, CAPILLARY: Glucose-Capillary: 125 mg/dL — ABNORMAL HIGH (ref 65–99)

## 2016-12-01 MED ORDER — ACETAMINOPHEN 10 MG/ML IV SOLN
1000.0000 mg | Freq: Four times a day (QID) | INTRAVENOUS | Status: AC | PRN
  Administered 2016-12-01 – 2016-12-02 (×3): 1000 mg via INTRAVENOUS
  Filled 2016-12-01 (×3): qty 100

## 2016-12-01 NOTE — Progress Notes (Signed)
Report received from [place]. Pt received to room 1606 via stretcher and transferred to bed. Family at bedside. Pt in NAD at this time. Education initiated as documented.

## 2016-12-01 NOTE — Progress Notes (Signed)
General Surgery [facility]  Assessment & Plan: HD#2 - small bowel obstruction, status post resection metastatic carcinoid tumor  AXR with multiple dilated loops small bowel, little gas in right colon now  IV Tylenol for pain  Encouraged OOB, IS use, ambulation  Repeat AXR in AM 11/26        Brittney Regal, MD, [facility]       Office: [phone]    Chief Complaint: Small bowel obstruction, metastatic carcinoid tumor  Subjective: Patient in bed, husband at bedside.  Low grade temp.  Some pain.  Passed small gas and stool today.  Objective: Vital signs in last 24 hours: Temp:  [98.2 F (36.8 C)-100.8 F (38.2 C)] 100.1 F (37.8 C) (11/25 1436) Pulse Rate:  [86-91] 91 (11/25 1405) Resp:  [13-17] 16 (11/25 1405) BP: (95-107)/(55-73) 105/56 (11/25 1405) SpO2:  [98 %-99 %] 98 % (11/25 1405) Last BM Date: 11/30/16  Intake/Output from previous day: 11/24 0701 - 11/25 0700 In: 1930 [P.O.:180; I.V.:1250; IV Piggyback:500] Out: -  Intake/Output this shift: Total I/O In: 60 [P.O.:60] Out: 100 [Urine:100]  Physical Exam: HEENT - sclerae clear, mucous membranes moist Neck - soft Chest - clear bilaterally Cor - RRR Abdomen - soft, mild distension; few BS present; wounds dry and intact; minimal diffuse tenderness Ext - no edema, non-tender Neuro - alert & oriented, no focal deficits  Lab Results:  Recent Labs    11/30/16 1613  WBC 8.7  HGB 10.2*  HCT 33.9*  PLT 312   BMET Recent Labs    11/30/16 1613  NA 137  K 3.5  CL 100*  CO2 29  GLUCOSE 122*  BUN 13  CREATININE 0.53  CALCIUM 9.1   PT/INR No results for input(s): LABPROT, INR in the last 72 hours. Comprehensive Metabolic Panel:    Component Value Date/Time   NA 137 11/30/2016 1613   NA 139 11/24/2016 0402   K 3.5 11/30/2016 1613   K 3.7 11/24/2016 0402   CL 100 (L) 11/30/2016 1613   CL 106 11/24/2016 0402   CO2 29 11/30/2016 1613   CO2 28  11/24/2016 0402   BUN 13 11/30/2016 1613   BUN 6 11/24/2016 0402   CREATININE 0.53 11/30/2016 1613   CREATININE 0.60 11/24/2016 0402   GLUCOSE 122 (H) 11/30/2016 1613   GLUCOSE 95 11/24/2016 0402   CALCIUM 9.1 11/30/2016 1613   CALCIUM 8.3 (L) 11/24/2016 0402   AST 20 11/30/2016 1613   AST 26 11/15/2016 0852   ALT 18 11/30/2016 1613   ALT 16 11/15/2016 0852   ALKPHOS 84 11/30/2016 1613   ALKPHOS 56 11/15/2016 0852   BILITOT 0.8 11/30/2016 1613   BILITOT 0.7 11/15/2016 0852   PROT 7.1 11/30/2016 1613   PROT 6.2 (L) 11/15/2016 0852   ALBUMIN 3.5 11/30/2016 1613   ALBUMIN 3.5 11/15/2016 0852    Studies/Results: Ct Abdomen Pelvis W Contrast  Result Date: 11/30/2016 CLINICAL DATA:  51 year old female with history of abdominal discomfort. Status post small bowel resection for intestinal tumor. Recent history of abdominal pain intermittently with some persistent vomiting for the past 3 days. EXAM: CT ABDOMEN AND PELVIS WITH CONTRAST TECHNIQUE: Multidetector CT imaging of the abdomen and pelvis was performed using the standard protocol following bolus administration of intravenous contrast. CONTRAST:  <See Chart> ISOVUE-300 IOPAMIDOL (ISOVUE-300) INJECTION 61% COMPARISON:  PET-CT 10/23/2016.  MRI of the abdomen 10/02/2016. FINDINGS: Lower chest: Mild scarring or  subsegmental atelectasis in the lower lobes of lungs bilaterally. Hepatobiliary: There are again multiple well-defined heterogeneously enhancing hepatic lesions scattered throughout the liver which appear generally similar in size, number and distribution compared to prior MRI 10/02/2016. The largest of these lesions is located in segment 7 (axial image 13 of series 2) measuring 3.3 x 3.0 cm. No definite new lesions are noted. No intra or extrahepatic biliary ductal dilatation. Gallbladder is normal in appearance. Pancreas: No pancreatic mass. No pancreatic ductal dilatation. No pancreatic or peripancreatic fluid or inflammatory changes.  Spleen: Unremarkable. Adrenals/Urinary Tract: Well-defined 3.1 cm simple cyst in the interpolar region of the left kidney. Right kidney and bilateral adrenal glands are normal in appearance. No hydroureteronephrosis. Urinary bladder is unremarkable in appearance. Stomach/Bowel: The appearance of the stomach is normal. There is extensive dilatation of the small bowel which is predominantly throughout the proximal to mid small bowel with sparing of the distal ileum. Maximum small bowel dilatation is approximately 4.4 cm. Multiple air-fluid levels are noted. There is a potential transition point in the low anterior anatomic pelvis adjacent to a suture line, best appreciated on axial image 69 of series 2 and coronal image 34 of series 4, suspicious for a stricture. Distal ileum beyond this point is non-dilated, and other than the cecum and ascending colon which are normally distended the remainder the colon is otherwise completely decompressed. Vascular/Lymphatic: No atherosclerotic disease, aneurysm or dissection noted in the abdominal or pelvic vasculature. No lymphadenopathy noted in the abdomen or pelvis. Reproductive: IUD present in the uterus. Ovaries are normal in appearance. Other: Small volume of ascites in the low anatomic pelvis and adjacent to the spleen. No pneumoperitoneum. Musculoskeletal: There are no aggressive appearing lytic or blastic lesions noted in the visualized portions of the skeleton. IMPRESSION: 1. Findings are compatible with small bowel obstruction with apparent transition point adjacent to a suture line in the distal small bowel, likely in the mid ileum. 2. Metastatic lesions throughout the liver appear similar in size, number and distribution to prior studies. 3. Previously noted mesenteric masses have been resected. No new mesenteric lesions are identified on today's examination. 4. Small volume of ascites. 5. Additional incidental findings, as above. Electronically Signed   By: Vinnie Langton M.D.   On: 11/30/2016 17:54   Dg Abd 2 Views  Result Date: 12/01/2016 CLINICAL DATA:  51 year old female with a history of small bowel obstruction EXAM: ABDOMEN - 2 VIEW COMPARISON:  CT 11/30/2016 FINDINGS: Distended small bowel loops throughout the abdomen with air-fluid levels on the upright image. Relative paucity of colonic gas. Surgical changes of the right pelvis, compatible with findings on prior CT. Intrauterine device. IMPRESSION: Persisting small bowel obstruction with multiple air-fluid levels and distended small bowel loops. Surgical changes of the right pelvis again demonstrated. Electronically Signed   By: Corrie Mckusick D.O.   On: 12/01/2016 08:59      Karam Dunson M 12/01/2016  Patient ID: Brittney [name], female   DOB: [birth year], 51 y.o.   MRN: [id number]

## 2016-12-01 NOTE — Plan of Care (Signed)
progressing 

## 2016-12-02 ENCOUNTER — Ambulatory Visit: Payer: Self-pay | Admitting: Oncology

## 2016-12-02 ENCOUNTER — Inpatient Hospital Stay (HOSPITAL_COMMUNITY): Payer: BLUE CROSS/BLUE SHIELD

## 2016-12-02 DIAGNOSIS — C7A8 Other malignant neuroendocrine tumors: Secondary | ICD-10-CM

## 2016-12-02 DIAGNOSIS — D509 Iron deficiency anemia, unspecified: Secondary | ICD-10-CM

## 2016-12-02 DIAGNOSIS — C7B8 Other secondary neuroendocrine tumors: Secondary | ICD-10-CM

## 2016-12-02 LAB — URINALYSIS, ROUTINE W REFLEX MICROSCOPIC
Bilirubin Urine: NEGATIVE
GLUCOSE, UA: NEGATIVE mg/dL
HGB URINE DIPSTICK: NEGATIVE
Ketones, ur: NEGATIVE mg/dL
Leukocytes, UA: NEGATIVE
Nitrite: NEGATIVE
PH: 5 (ref 5.0–8.0)
PROTEIN: NEGATIVE mg/dL
Specific Gravity, Urine: 1.026 (ref 1.005–1.030)

## 2016-12-02 LAB — CBC
HCT: 25.7 % — ABNORMAL LOW (ref 36.0–46.0)
Hemoglobin: 7.6 g/dL — ABNORMAL LOW (ref 12.0–15.0)
MCH: 20.6 pg — AB (ref 26.0–34.0)
MCHC: 29.6 g/dL — ABNORMAL LOW (ref 30.0–36.0)
MCV: 69.6 fL — AB (ref 78.0–100.0)
PLATELETS: 257 10*3/uL (ref 150–400)
RBC: 3.69 MIL/uL — ABNORMAL LOW (ref 3.87–5.11)
RDW: 18 % — AB (ref 11.5–15.5)
WBC: 6 10*3/uL (ref 4.0–10.5)

## 2016-12-02 LAB — BASIC METABOLIC PANEL
Anion gap: 5 (ref 5–15)
BUN: 9 mg/dL (ref 6–20)
CALCIUM: 8.3 mg/dL — AB (ref 8.9–10.3)
CO2: 25 mmol/L (ref 22–32)
Chloride: 107 mmol/L (ref 101–111)
Creatinine, Ser: 0.49 mg/dL (ref 0.44–1.00)
GFR calc Af Amer: >60 mL/min (ref >60)
GLUCOSE: 127 mg/dL — AB (ref 65–99)
POTASSIUM: 4 mmol/L (ref 3.5–5.1)
Sodium: 137 mmol/L (ref 135–145)

## 2016-12-02 NOTE — Progress Notes (Signed)
IP PROGRESS NOTE  Subjective:   Brittney [name] underwent a laparoscopic small bowel resection by Dr. Barry Dienes on 11/21/2016.  A mass was found in the mid ileum with a mesenteric lymph no mass.  Liver metastases were seen.  The pathology revealed a low-grade neuroendocrine tumor measuring 3 cm with a 0.8 cm intramural satellite nodule.  Lymphovascular and perineural invasion were present.  6 of 18 lymph nodes contained metastatic carcinoma and the mesenteric margin was positive with lymph node involvement.  She noted resolution of abdominal pain and bloating following surgery.  She was admitted 11/30/2016 with nausea/vomiting.  A CT revealed evidence of a small bowel obstruction with transition point adjacent to a suture line at the distal small bowel.  Liver metastases were noted.  The previously noted mesenteric mass was resected.  She reports feeling better today.  She had a bowel movement.  Objective: Vital signs in last 24 hours: Blood pressure 109/63, pulse 93, temperature 100 F (37.8 C), temperature source Oral, resp. rate 16, height 5\' 5"  (1.651 m), weight 128 lb (58.1 kg), last menstrual period 01/31/2014, SpO2 99 %.  Intake/Output from previous day: 11/25 0701 - 11/26 0700 In: 3176.7 [P.O.:360; I.V.:2716.7; IV Piggyback:100] Out: 100 [Urine:100]  Physical Exam:  Abdomen: Mildly distended, healed midline incision Extremities: No leg edema    Lab Results: Recent Labs    11/30/16 1613  WBC 8.7  HGB 10.2*  HCT 33.9*  PLT 312    BMET Recent Labs    11/30/16 1613  NA 137  K 3.5  CL 100*  CO2 29  GLUCOSE 122*  BUN 13  CREATININE 0.53  CALCIUM 9.1    No results found for: CEA1  Studies/Results: Ct Abdomen Pelvis W Contrast  Result Date: 11/30/2016 CLINICAL DATA:  51 year old female with history of abdominal discomfort. Status [name] small bowel resection for intestinal tumor. Recent history of abdominal pain intermittently with some persistent vomiting for the  past 3 days. EXAM: CT ABDOMEN AND PELVIS WITH CONTRAST TECHNIQUE: Multidetector CT imaging of the abdomen and pelvis was performed using the standard protocol following bolus administration of intravenous contrast. CONTRAST:  <See Chart> ISOVUE-300 IOPAMIDOL (ISOVUE-300) INJECTION 61% COMPARISON:  PET-CT 10/23/2016.  MRI of the abdomen 10/02/2016. FINDINGS: Lower chest: Mild scarring or subsegmental atelectasis in the lower lobes of lungs bilaterally. Hepatobiliary: There are again multiple well-defined heterogeneously enhancing hepatic lesions scattered throughout the liver which appear generally similar in size, number and distribution compared to prior MRI 10/02/2016. The largest of these lesions is located in segment 7 (axial image 13 of series 2) measuring 3.3 x 3.0 cm. No definite new lesions are noted. No intra or extrahepatic biliary ductal dilatation. Gallbladder is normal in appearance. Pancreas: No pancreatic mass. No pancreatic ductal dilatation. No pancreatic or peripancreatic fluid or inflammatory changes. Spleen: Unremarkable. Adrenals/Urinary Tract: Well-defined 3.1 cm simple cyst in the interpolar region of the left kidney. Right kidney and bilateral adrenal glands are normal in appearance. No hydroureteronephrosis. Urinary bladder is unremarkable in appearance. Stomach/Bowel: The appearance of the stomach is normal. There is extensive dilatation of the small bowel which is predominantly throughout the proximal to mid small bowel with sparing of the distal ileum. Maximum small bowel dilatation is approximately 4.4 cm. Multiple air-fluid levels are noted. There is a potential transition point in the low anterior anatomic pelvis adjacent to a suture line, best appreciated on axial image 69 of series 2 and coronal image 34 of series 4, suspicious for a stricture. Distal ileum  beyond this point is non-dilated, and other than the cecum and ascending colon which are normally distended the remainder the  colon is otherwise completely decompressed. Vascular/Lymphatic: No atherosclerotic disease, aneurysm or dissection noted in the abdominal or pelvic vasculature. No lymphadenopathy noted in the abdomen or pelvis. Reproductive: IUD present in the uterus. Ovaries are normal in appearance. Other: Small volume of ascites in the low anatomic pelvis and adjacent to the spleen. No pneumoperitoneum. Musculoskeletal: There are no aggressive appearing lytic or blastic lesions noted in the visualized portions of the skeleton. IMPRESSION: 1. Findings are compatible with small bowel obstruction with apparent transition point adjacent to a suture line in the distal small bowel, likely in the mid ileum. 2. Metastatic lesions throughout the liver appear similar in size, number and distribution to prior studies. 3. Previously noted mesenteric masses have been resected. No new mesenteric lesions are identified on today's examination. 4. Small volume of ascites. 5. Additional incidental findings, as above. Electronically Signed   By: Vinnie Langton M.D.   On: 11/30/2016 17:54   Dg Abd 2 Views  Result Date: 12/01/2016 CLINICAL DATA:  51 year old female with a history of small bowel obstruction EXAM: ABDOMEN - 2 VIEW COMPARISON:  CT 11/30/2016 FINDINGS: Distended small bowel loops throughout the abdomen with air-fluid levels on the upright image. Relative paucity of colonic gas. Surgical changes of the right pelvis, compatible with findings on prior CT. Intrauterine device. IMPRESSION: Persisting small bowel obstruction with multiple air-fluid levels and distended small bowel loops. Surgical changes of the right pelvis again demonstrated. Electronically Signed   By: Corrie Mckusick D.O.   On: 12/01/2016 08:59    Medications: I have reviewed the patient's current medications.  Assessment/Plan: 1. Metastatic neuroendocrine tumor ? MRI of the abdomen 10/02/2016 confirmed multiple enhancing liver lesions consistent with  metastases, no primary tumor site identified ? Ultrasound-guided biopsy of a left liver lesion 10/07/2016-neuroendocrine neoplasm, "intermediate grade ", review of pathology at GI tumor conference consistent with a low-grade carcinoid tumor ? Elevated chromogranin A 10/15/2016 ?  gallium-DOTATATE scan 10/23/2016-multiple foci of liver metastases, enlarged central mesenteric lymph nodes, short intussusception's in adjacent small bowel felt to represent a primary small bowel tumor, additional mesenteric lymph nodes with metabolic activity, deep right pelvic nodule consistent with a pelvic metastasis ? Small bowel resection 11/21/2016- mid ileum mass, low-grade neuroendocrine tumor,pT4,pN2, 6/18 lymph nodes positive, positive mesenteric resection margin (lymph node), intramural satellite nodule ? CT 11/30/2016-small bowel obstruction with transition point adjacent to suture line at the distal small bowel, liver metastases similar to the 10/02/2016 MRI  2. 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 Intermittent abdominal bloating and left-sided abdominal pain  4.  Admission 11/30/2016 with a small bowel obstruction  Brittney [name] underwent resection of a primary small bowel carcinoid tumor.  She is now admitted with a small bowel obstruction, likely secondary to adhesions.  I discussed treatment of the metastatic carcinoid tumor with Ms. Sarratt and her husband again today.  She is anxious to begin systemic therapy with Sandostatin and Lutathera.  I will contact Dr. Ilda Foil to see if he can evaluate her while in the hospital and begin the insurance approval process for [place].  We will schedule outpatient follow-up.         LOS: 2 days   [name], MD   12/02/2016, 7:05 AM

## 2016-12-03 ENCOUNTER — Telehealth: Payer: Self-pay | Admitting: Oncology

## 2016-12-03 LAB — CBC
HCT: 25 % — ABNORMAL LOW (ref 36.0–46.0)
Hemoglobin: 7.5 g/dL — ABNORMAL LOW (ref 12.0–15.0)
MCH: 20.6 pg — AB (ref 26.0–34.0)
MCHC: 30 g/dL (ref 30.0–36.0)
MCV: 68.7 fL — AB (ref 78.0–100.0)
PLATELETS: 303 10*3/uL (ref 150–400)
RBC: 3.64 MIL/uL — ABNORMAL LOW (ref 3.87–5.11)
RDW: 18.2 % — AB (ref 11.5–15.5)
WBC: 5.6 10*3/uL (ref 4.0–10.5)

## 2016-12-03 LAB — BASIC METABOLIC PANEL
Anion gap: 6 (ref 5–15)
BUN: 6 mg/dL (ref 6–20)
CALCIUM: 8.4 mg/dL — AB (ref 8.9–10.3)
CO2: 25 mmol/L (ref 22–32)
CREATININE: 0.51 mg/dL (ref 0.44–1.00)
Chloride: 106 mmol/L (ref 101–111)
GFR calc Af Amer: >60 mL/min (ref >60)
GLUCOSE: 124 mg/dL — AB (ref 65–99)
Potassium: 4.4 mmol/L (ref 3.5–5.1)
SODIUM: 137 mmol/L (ref 135–145)

## 2016-12-03 NOTE — Telephone Encounter (Signed)
Spoke with patient regarding appt added per 11/26 sch msg.

## 2016-12-03 NOTE — Progress Notes (Signed)
General Surgery [facility]  Assessment & Plan: HD#4 - small bowel obstruction, status post resection metastatic carcinoid tumor    IV Tylenol for pain/toradol  Encouraged OOB, IS use, ambulation Pt clearly only partially obstructed since she is passing gas and having BMs, but remains bloated.  Will start TPN if no improvement by tomorrow.  Will plan repeat CT if no resolution by Friday.           [name], MD [facility]       Office: [phone]    Chief Complaint: Small bowel obstruction, metastatic carcinoid tumor  Subjective: Had an episode of pain yesterday and threw up after erroneously being given ensure like beverage.    Objective: Vital signs in last 24 hours: Temp:  [98.9 F (37.2 C)-99.3 F (37.4 C)] 99.3 F (37.4 C) (11/26 2100) Pulse Rate:  [76-81] 81 (11/26 2100) Resp:  [15-16] 16 (11/26 2100) BP: (107-121)/(69-78) 121/78 (11/26 2100) SpO2:  [98 %-100 %] 98 % (11/26 2100) Last BM Date: 12/03/16  Intake/Output from previous day: 11/26 0701 - 11/27 0700 In: 2580.8 [P.O.:360; I.V.:2220.8] Out: -  Intake/Output this shift: No intake/output data recorded.  Physical Exam: HEENT - sclerae clear, mucous membranes moist Neck - soft Chest - clear bilaterally Cor - RRR Abdomen - soft, mild distension; few BS present; wounds dry and intact; minimal diffuse tenderness Ext - no edema, non-tender Neuro - alert & oriented, no focal deficits  Lab Results:  Recent Labs    12/02/16 0906 12/03/16 0518  WBC 6.0 5.6  HGB 7.6* 7.5*  HCT 25.7* 25.0*  PLT 257 303   BMET Recent Labs    12/02/16 0906 12/03/16 0518  NA 137 137  K 4.0 4.4  CL 107 106  CO2 25 25  GLUCOSE 127* 124*  BUN 9 6  CREATININE 0.49 0.51  CALCIUM 8.3* 8.4*   PT/INR No results for input(s): LABPROT, INR in the last 72 hours. Comprehensive Metabolic Panel:    Component Value Date/Time   NA 137 12/03/2016 0518   NA 137  12/02/2016 0906   K 4.4 12/03/2016 0518   K 4.0 12/02/2016 0906   CL 106 12/03/2016 0518   CL 107 12/02/2016 0906   CO2 25 12/03/2016 0518   CO2 25 12/02/2016 0906   BUN 6 12/03/2016 0518   BUN 9 12/02/2016 0906   CREATININE 0.51 12/03/2016 0518   CREATININE 0.49 12/02/2016 0906   GLUCOSE 124 (H) 12/03/2016 0518   GLUCOSE 127 (H) 12/02/2016 0906   CALCIUM 8.4 (L) 12/03/2016 0518   CALCIUM 8.3 (L) 12/02/2016 0906   AST 20 11/30/2016 1613   AST 26 11/15/2016 0852   ALT 18 11/30/2016 1613   ALT 16 11/15/2016 0852   ALKPHOS 84 11/30/2016 1613   ALKPHOS 56 11/15/2016 0852   BILITOT 0.8 11/30/2016 1613   BILITOT 0.7 11/15/2016 0852   PROT 7.1 11/30/2016 1613   PROT 6.2 (L) 11/15/2016 0852   ALBUMIN 3.5 11/30/2016 1613   ALBUMIN 3.5 11/15/2016 0852    Studies/Results: Dg Abd 2 Views  Result Date: 12/02/2016 CLINICAL DATA:  Small-bowel obstruction EXAM: ABDOMEN - 2 VIEW COMPARISON:  Lung 05/26/2016 FINDINGS: Persistent small bowel dilatation measuring up to 5 cm with multiple air-fluid levels. No colonic air. No pneumatosis, pneumoperitoneum or portal venous gas. No calcifications along the expected course of the ureters. No acute osseous abnormality. IMPRESSION: Persistent small-bowel obstruction without significant interval change  compared with 12/01/2016. Electronically Signed   By: Kathreen Devoid   On: 12/02/2016 10:34   Dg Abd 2 Views  Result Date: 12/01/2016 CLINICAL DATA:  51 year old female with a history of small bowel obstruction EXAM: ABDOMEN - 2 VIEW COMPARISON:  CT 11/30/2016 FINDINGS: Distended small bowel loops throughout the abdomen with air-fluid levels on the upright image. Relative paucity of colonic gas. Surgical changes of the right pelvis, compatible with findings on prior CT. Intrauterine device. IMPRESSION: Persisting small bowel obstruction with multiple air-fluid levels and distended small bowel loops. Surgical changes of the right pelvis again demonstrated.  Electronically Signed   By: Corrie Mckusick D.O.   On: 12/01/2016 08:59      [name] 12/03/2016  Patient ID: Brittney [name], female   DOB: September 05, 1965, 51 y.o.   MRN: [id number]

## 2016-12-04 ENCOUNTER — Inpatient Hospital Stay (HOSPITAL_COMMUNITY): Payer: BLUE CROSS/BLUE SHIELD

## 2016-12-04 ENCOUNTER — Other Ambulatory Visit: Payer: Self-pay | Admitting: Oncology

## 2016-12-04 DIAGNOSIS — C7A019 Malignant carcinoid tumor of the small intestine, unspecified portion: Secondary | ICD-10-CM

## 2016-12-04 HISTORY — PX: IR RADIOLOGIST EVAL & MGMT: IMG5224

## 2016-12-04 LAB — CBC
HCT: 25.3 % — ABNORMAL LOW (ref 36.0–46.0)
Hemoglobin: 7.4 g/dL — ABNORMAL LOW (ref 12.0–15.0)
MCH: 20.1 pg — ABNORMAL LOW (ref 26.0–34.0)
MCHC: 29.2 g/dL — ABNORMAL LOW (ref 30.0–36.0)
MCV: 68.6 fL — AB (ref 78.0–100.0)
PLATELETS: 292 10*3/uL (ref 150–400)
RBC: 3.69 MIL/uL — ABNORMAL LOW (ref 3.87–5.11)
RDW: 17.9 % — AB (ref 11.5–15.5)
WBC: 5.8 10*3/uL (ref 4.0–10.5)

## 2016-12-04 LAB — BASIC METABOLIC PANEL
ANION GAP: 5 (ref 5–15)
BUN: <5 mg/dL — ABNORMAL LOW (ref 6–20)
CHLORIDE: 105 mmol/L (ref 101–111)
CO2: 27 mmol/L (ref 22–32)
Calcium: 8.3 mg/dL — ABNORMAL LOW (ref 8.9–10.3)
Creatinine, Ser: 0.56 mg/dL (ref 0.44–1.00)
GFR calc Af Amer: >60 mL/min (ref >60)
GFR calc non Af Amer: >60 mL/min (ref >60)
Glucose, Bld: 109 mg/dL — ABNORMAL HIGH (ref 65–99)
Potassium: 4.1 mmol/L (ref 3.5–5.1)
SODIUM: 137 mmol/L (ref 135–145)

## 2016-12-04 NOTE — Progress Notes (Signed)
  Oncology Nurse Navigator Documentation  Navigator Location: [facility] (12/04/16 1300) Referral date to RadOnc/MedOnc: 10/15/16 (12/04/16 1300) )      Confirmed Diagnosis Date: 11/21/16 (12/04/16 1300) Surgery Date: 11/21/16 (12/04/16 1300)           Treatment Initiated Date: 11/21/16 (12/04/16 1300) Patient Visit Type: Inpatient (12/04/16 1300)       Interventions: Psycho-social support (12/04/16 1300)  I met with patient on 5W to offer emotional support and to assess barriers, questions and navigation needs. Patient tearful while processing the past week.  No navigation needs identified. Patient verbalized understanding that she can call me with questions, concerns or for emotional /pychosocial support.        Acuity: Level 2 (12/04/16 1300)   Acuity Level 2: Ongoing guidance and education throughout treatment as needed (12/04/16 1300)     Time Spent with Patient: 30 (12/04/16 1300)

## 2016-12-04 NOTE — Consult Note (Signed)
Chief Complaint: Patient was seen in consultation for Metastatic well differentiated neuroendocrine tumor in preperation and evaluation for Peptide Receptor Radiotherapy (Lu 177 DOTATATE)  Chief Complaint  Patient presents with  . Post-op Problem  . Abdominal Pain   at the request of * No referring provider recorded for this case *  Referring Physician(s): Dr. Benay Spice    Patient Status: [facility] - In-pt  History of Present Illness: 51 year old female who was in normal state of health when she noted abdominal bloating and distention.  Ultrasound and ultimately CT exams revealed liver lesions which upon biopsy or confirmed well differentiated neuroendocrine tumor. Subsequent Ga 68 DOTATATE PET scan demonstrated malignant DOTATATE avid lymph nodes in the retroperitoneum, liver metastasis, and a lesion in the small bowel causing an intussusception. Patient subsequently underwent small bowel resection for small bowel obstruction which revealed a small bowel neuroendocrine tumor at this site intussusception.    Currently patient is recovering from ileus / small-bowel obstruction following abdominal surgery 11/21/2016.    Of note patient does not demonstrate carcinoid symptoms denying flushing, diarrhea, GI upset.  Chromagranin A  is mildly elevated at 10.       Past Medical History:  Diagnosis Date  . 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 Disorder of sulfur-bearing amino-acid metabolism (HCC)    pateint denies  . Family history of sudden cardiac death    patient has two cousins that died from MI  . Heart palpitations    medication induced about 2 years has not had any problem since  . Iron deficiency anemia   . Magnesium deficiency    patient denies  . 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 Mixed hyperlipidemia   . Premenstrual tension  syndrome    denies  . Vitamin D deficiency     Past Surgical History:  Procedure Laterality Date  . BOWEL RESECTION N/A 11/21/2016   Procedure: LAPAROSCOPIC SMALL BOWEL RESECTION;  Surgeon: [name], MD;  Location: [facility];  Service: General;  Laterality: N/A;  . BREAST SURGERY  2005   lumphectomy begin  . CESAREAN SECTION    . COLON SURGERY    . COLONOSCOPY    . WISDOM TOOTH EXTRACTION      Allergies: Other; Peanut-containing drug products; Trilyte [peg 9323-[id number] bicarb-nacl]; Penicillins; and Percocet [oxycodone-acetaminophen]  Medications: Prior to Admission medications   Medication Sig Start Date End Date Taking? Authorizing Provider  ibuprofen (ADVIL,MOTRIN) 200 MG tablet Take 200 mg by mouth every 6 (six) hours as needed for moderate pain.   Yes [provider]  LORazepam (ATIVAN) 0.5 MG tablet Take 1 tablet (0.5 mg total) by mouth at bedtime as needed for anxiety or sleep. 10/28/16  Yes Ladell Pier, MD  Nutritional Supplements (JUICE PLUS FIBRE PO) Take 2 each by mouth daily. [employer]   Yes [provider]  Nutritional Supplements (JUICE PLUS FIBRE PO) Take 2 capsules by mouth daily. [employer]   Yes [provider]  Nutritional Supplements (JUICE PLUS FIBRE PO) Take 2 each by mouth daily. [employer]   Yes [provider]  traMADol (ULTRAM) 50 MG tablet Take 1 tablet (50 mg total) every 6 (six) hours as needed by mouth (mild pain). 11/24/16  Yes Autumn Messing III, MD  IRON PO Take 75 mg 2 (two) times daily by mouth.  [provider]     Family History  Problem Relation Age of Onset  . Sudden Cardiac Death Cousin 50       Female (maternal)  . Sudden Cardiac Death Cousin 62       Female (paternal)   . Cancer Mother        colon  . Cancer Father        lung    Social History   Socioeconomic History  . Marital status: Married    Spouse name: None  . Number of children: 2  . Years of education: None  .  Highest education level: None  Social Needs  . Financial resource strain: None  . Food insecurity - worry: None  . Food insecurity - inability: None  . Transportation needs - medical: None  . Transportation needs - non-medical: None  Occupational History  . Occupation: Armed forces operational officer  Tobacco Use  . Smoking status: Never Smoker  . Smokeless tobacco: Never Used  Substance and Sexual Activity  . Alcohol use: Yes    Comment: social/ 1 glass wine daily  . Drug use: No  . Sexual activity: None  Other Topics Concern  . None  Social History Narrative   Lives with husband and one child.     ECOG Status: 0 - Asymptomatic  Review of Systems: A 12 point ROS discussed and pertinent positives are indicated in the HPI above.  All other systems are negative.  Review of Systems  Gastrointestinal: Positive for abdominal distention and abdominal pain.  Negative for GI symptoms of diarrhea or GI upset. No flushing or palpitations.  Vital Signs: BP 118/77 (BP Location: Left Arm)   Pulse 94   Temp 100 F (37.8 C) (Oral)   Resp 16   Ht 5\' 5"  (1.651 m)   Wt 128 lb (58.1 kg)   LMP 01/31/2014 Comment: patient signed preg test waiver  SpO2 96%   BMI 21.30 kg/m   Physical Exam  Constitutional: She appears well-developed and well-nourished.  HENT:  Head: Normocephalic.  Pulmonary/Chest: Effort normal.  Abdominal: There is generalized tenderness.    Imaging:  EXAM:  11/23/16 NUCLEAR MEDICINE PET SKULL BASE TO THIGH  TECHNIQUE: 3.2 mCi Ga 68 DOTATATE was injected intravenously. Full-ring PE IMPRESSION: 1. Multiple foci of intense radiotracer accumulation within hepatic lesions consistent with well differentiated neuroendocrine tumor metastasis. 2. Enlarged nodes / implants within central mesenteries of the upper pelvis consistent nodal metastasis measuring up to 3.5 cm. 3. Short intussusceptions in the same vicinity(mid small bowel, upper pelvis) with adjacent radiotracer  activity (misregistration) is favored site of primary small bowel neoplasm. 4. Additional small lymph nodes within the mesenteries with associated metabolic activity. 5. Deep RIGHT pelvis nodule with radiotracer activity consistent with pelvic metastasis.      Labs:  CBC: Recent Labs    11/30/16 1613 12/02/16 0906 12/03/16 0518 12/04/16 0542  WBC 8.7 6.0 5.6 5.8  HGB 10.2* 7.6* 7.5* 7.4*  HCT 33.9* 25.7* 25.0* 25.3*  PLT 312 257 303 292    COAGS: Recent Labs    10/07/16 0610  INR 0.95  APTT 33    BMP: Recent Labs    11/30/16 1613 12/02/16 0906 12/03/16 0518 12/04/16 0542  NA 137 137 137 137  K 3.5 4.0 4.4 4.1  CL 100* 107 106 105  CO2 29 25 25 27   GLUCOSE 122* 127* 124* 109*  BUN 13 9 6  <5*  CALCIUM 9.1 8.3* 8.4* 8.3*  CREATININE 0.53 0.49 0.51  0.56  GFRNONAA >60 >60 >60 >60  GFRAA >60 >60 >60 >60    LIVER FUNCTION TESTS: Recent Labs    11/15/16 0852 11/30/16 1613  BILITOT 0.7 0.8  AST 26 20  ALT 16 18  ALKPHOS 56 84  PROT 6.2* 7.1  ALBUMIN 3.5 3.5    TUMOR MARKERS: Chromagranin A = 10  Assessment and Plan:  Patient is a good candidate for Lutathera therapy. Patient has well differentiated neuroendocrine tumor avid for Ga DOTATATE.  Tumors are unresectable in the liver and she has metastatic retroperitoneal nodes .Liver function, renal function and hematologic values are within normal limits. Patient has no carcinoid symptoms.  Patient is excellent health and recovering  well from small-bowel surgery.  Patient is scheduled for LAR sandostatin injection Dec 10th.  We will proceed with insurance eligibility and plan PRRT accordingly.       Thank you for this interesting consult.  I greatly enjoyed meeting Brittney [name] and look forward to participating in their care.  A copy of this report was sent to the requesting provider on this date.  Electronically Signed: Halina Andreas, MD 12/04/2016, 4:10 PM   I spent a total of 30 minutes  in face to face in clinical consultation, greater than 50% of which was counseling/coordinating care for metastatic neuroendocrine tumor

## 2016-12-04 NOTE — Discharge Summary (Signed)
Physician Discharge Summary  Patient ID: Brittney [name] MRN: [id number] DOB/AGE: Jan 02, 1966 51 y.o.  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  She had good pain control with minimal narcotic use.  She had return of bowel function over several days and was able to have her diet advanced.  She was able to void independently.  She was ambulatory.  She used her IS.  She was discharged to home in stable condition.    Consults: None  Significant Diagnostic Studies: HCT 27.8, normal electrolytes on day of discharge  Treatments: surgery: see above  Discharge Exam: Blood pressure (!) 90/55, pulse 65, temperature 98.1 F (36.7 C), resp. rate 18, height 5\' 5"  (1.651 m), weight 58.5 kg (129 lb), last menstrual period 01/31/2014, SpO2 100 %. General appearance: alert, cooperative and no distress Resp: breathing comfortably GI: soft, non distended, non tender.  some bruising at extraction port site.  Disposition: 01-Home or Self Care  Discharge Instructions    Call MD for:  difficulty breathing, headache or visual disturbances   Complete by:  As directed    Call MD for:  extreme fatigue   Complete by:  As directed    Call MD for:  hives   Complete by:  As directed    Call MD for:  persistant dizziness or light-headedness   Complete by:  As directed    Call MD for:  persistant nausea and vomiting   Complete by:  As directed    Call MD for:  redness, tenderness, or signs of infection (pain, swelling, redness, odor or green/yellow discharge around incision site)   Complete by:  As directed    Call MD for:  severe  uncontrolled pain   Complete by:  As directed    Call MD for:  temperature >100.4   Complete by:  As directed    Diet - low sodium heart healthy   Complete by:  As directed    Discharge instructions   Complete by:  As directed    May shower. Diet as tolerated. No heavy lifting   Increase activity slowly   Complete by:  As directed    No wound care   Complete by:  As directed      Allergies as of 11/24/2016      Reactions   Other Anaphylaxis, Hives   Tree nuts Peanuts   Peanut-containing Drug Products Anaphylaxis   Trilyte [peg [id number]-na Bicarb-nacl] Other (See Comments)   Throwing up   Penicillins Other (See Comments)   "childhood allergy"  Has patient had a PCN reaction causing immediate rash, facial/tongue/throat swelling, SOB or lightheadedness with hypotension: unkn Has patient had a PCN reaction that required hospitalization: unkn Has patient had a PCN reaction occurring within the last 10 years: unkn If all of the above answers are "NO", then may proceed with Cephalosporin use.   Percocet [oxycodone-acetaminophen] Nausea And Vomiting      Medication List    TAKE these medications   IRON PO Take 75 mg 2 (two) times daily by mouth.   JUICE PLUS FIBRE PO Take 2 each by mouth daily. Orchard Blend   JUICE PLUS FIBRE PO Take 2 capsules by  mouth daily. Garden Blend   JUICE PLUS FIBRE PO Take 2 each by mouth daily. Vineyard Blend   LORazepam 0.5 MG tablet Commonly known as:  ATIVAN Take 1 tablet (0.5 mg total) by mouth at bedtime as needed for anxiety or sleep.   traMADol 50 MG tablet Commonly known as:  ULTRAM Take 1 tablet (50 mg total) every 6 (six) hours as needed by mouth (mild pain).      Follow-up Information    [name], MD Follow up in 2 week(s).   Specialty:  General Surgery Contact information: [address] [place] [address] [phone]           Signed: [name] 12/04/2016, 11:08 AM

## 2016-12-04 NOTE — Progress Notes (Signed)
General Surgery [facility]  Assessment & Plan: HD#5 - partial small bowel obstruction, status post resection metastatic carcinoid tumor    Definitely making clinical improvements today.   Will do clear liquids ad lib and full liquids tonight if tolerating.             [name], MD [facility]       Office: [phone]    Chief Complaint: Small bowel obstruction, metastatic carcinoid tumor  Subjective: Pt continues to pass gas, much more so than the day before.  She also had 3 BMs.  Objective: Vital signs in last 24 hours: Temp:  [99.5 F (37.5 C)-99.9 F (37.7 C)] 99.5 F (37.5 C) (11/28 0056) Pulse Rate:  [96-98] 97 (11/28 0056) Resp:  [16] 16 (11/28 0056) BP: (115-118)/(67-70) 118/70 (11/28 0056) SpO2:  [98 %-99 %] 99 % (11/28 0056) Last BM Date: 12/03/16  Intake/Output from previous day: 11/27 0701 - 11/28 0700 In: 520 [P.O.:120; I.V.:400] Out: 1150 [Urine:1150] Intake/Output this shift: Total I/O In: -  Out: 600 [Urine:600]  Physical Exam: HEENT - sclerae clear, mucous membranes moist Neck - soft Breathing comfortably Abdomen - soft, still bloated, but softer than yesterday.  Non tender.   Ext - no edema, non-tender Neuro - alert & oriented, no focal deficits  Lab Results:  Recent Labs    12/03/16 0518 12/04/16 0542  WBC 5.6 5.8  HGB 7.5* 7.4*  HCT 25.0* 25.3*  PLT 303 292   BMET Recent Labs    12/03/16 0518 12/04/16 0542  NA 137 137  K 4.4 4.1  CL 106 105  CO2 25 27  GLUCOSE 124* 109*  BUN 6 <5*  CREATININE 0.51 0.56  CALCIUM 8.4* 8.3*   PT/INR No results for input(s): LABPROT, INR in the last 72 hours. Comprehensive Metabolic Panel:    Component Value Date/Time   NA 137 12/04/2016 0542   NA 137 12/03/2016 0518   K 4.1 12/04/2016 0542   K 4.4 12/03/2016 0518   CL 105 12/04/2016 0542   CL 106 12/03/2016 0518   CO2 27 12/04/2016 0542   CO2 25 12/03/2016 0518   BUN <5  (L) 12/04/2016 0542   BUN 6 12/03/2016 0518   CREATININE 0.56 12/04/2016 0542   CREATININE 0.51 12/03/2016 0518   GLUCOSE 109 (H) 12/04/2016 0542   GLUCOSE 124 (H) 12/03/2016 0518   CALCIUM 8.3 (L) 12/04/2016 0542   CALCIUM 8.4 (L) 12/03/2016 0518   AST 20 11/30/2016 1613   AST 26 11/15/2016 0852   ALT 18 11/30/2016 1613   ALT 16 11/15/2016 0852   ALKPHOS 84 11/30/2016 1613   ALKPHOS 56 11/15/2016 0852   BILITOT 0.8 11/30/2016 1613   BILITOT 0.7 11/15/2016 0852   PROT 7.1 11/30/2016 1613   PROT 6.2 (L) 11/15/2016 0852   ALBUMIN 3.5 11/30/2016 1613   ALBUMIN 3.5 11/15/2016 0852    Studies/Results: Dg Abd 2 Views  Result Date: 12/02/2016 CLINICAL DATA:  Small-bowel obstruction EXAM: ABDOMEN - 2 VIEW COMPARISON:  Lung 05/26/2016 FINDINGS: Persistent small bowel dilatation measuring up to 5 cm with multiple air-fluid levels. No colonic air. No pneumatosis, pneumoperitoneum or portal venous gas. No calcifications along the expected course of the ureters. No acute osseous abnormality. IMPRESSION: Persistent small-bowel obstruction without significant interval change compared with 12/01/2016. Electronically Signed   By: Kathreen Devoid   On: 12/02/2016 10:34      [name] 12/04/2016  Patient ID: Brittney [name], female   DOB: 03-11-1965, 51 y.o.   MRN: [id number]

## 2016-12-05 ENCOUNTER — Inpatient Hospital Stay (HOSPITAL_COMMUNITY): Payer: BLUE CROSS/BLUE SHIELD

## 2016-12-05 ENCOUNTER — Encounter (HOSPITAL_COMMUNITY): Payer: Self-pay | Admitting: Radiology

## 2016-12-05 MED ORDER — ACETAMINOPHEN 325 MG PO TABS: 650.0000 mg | ORAL_TABLET | Freq: Four times a day (QID) | ORAL | Status: DC | PRN

## 2016-12-05 MED ORDER — IBUPROFEN 200 MG PO TABS
600.0000 mg | ORAL_TABLET | Freq: Four times a day (QID) | ORAL | Status: DC | PRN
  Administered 2016-12-05: 600 mg via ORAL
  Filled 2016-12-05: qty 3

## 2016-12-05 MED ORDER — IBUPROFEN 200 MG PO TABS
600.0000 mg | ORAL_TABLET | Freq: Four times a day (QID) | ORAL | Status: DC | PRN
Start: 2016-12-05 — End: 2016-12-05

## 2016-12-05 MED ORDER — IBUPROFEN 200 MG PO TABS: 600.0000 mg | ORAL_TABLET | Freq: Four times a day (QID) | ORAL | Status: DC | PRN

## 2016-12-05 MED ORDER — LORAZEPAM 0.5 MG PO TABS
0.5000 mg | ORAL_TABLET | Freq: Three times a day (TID) | ORAL | Status: DC | PRN
  Administered 2016-12-05: 0.5 mg via ORAL
  Filled 2016-12-05: qty 1

## 2016-12-05 MED ORDER — IOPAMIDOL (ISOVUE-370) INJECTION 76%
INTRAVENOUS | Status: AC
  Filled 2016-12-05: qty 100

## 2016-12-05 MED ORDER — ACETAMINOPHEN 500 MG PO TABS
1000.0000 mg | ORAL_TABLET | Freq: Four times a day (QID) | ORAL | Status: DC | PRN
  Filled 2016-12-05: qty 2

## 2016-12-05 MED ORDER — IOPAMIDOL (ISOVUE-300) INJECTION 61%
INTRAVENOUS | Status: AC
  Administered 2016-12-05: 15 mL
  Filled 2016-12-05: qty 30

## 2016-12-05 MED ORDER — IOPAMIDOL (ISOVUE-370) INJECTION 76%
100.0000 mL | Freq: Once | INTRAVENOUS | Status: AC | PRN
  Administered 2016-12-05: 100 mL via INTRAVENOUS

## 2016-12-05 MED ORDER — PANTOPRAZOLE SODIUM 40 MG PO TBEC
40.0000 mg | DELAYED_RELEASE_TABLET | Freq: Every day | ORAL | Status: DC
  Administered 2016-12-05: 40 mg via ORAL
  Filled 2016-12-05: qty 1

## 2016-12-05 MED ORDER — ACETAMINOPHEN 325 MG PO TABS
325.0000 mg | ORAL_TABLET | Freq: Once | ORAL | Status: AC
  Administered 2016-12-05: 325 mg via ORAL
  Filled 2016-12-05: qty 1

## 2016-12-05 MED ORDER — ACETAMINOPHEN 325 MG PO TABS
650.0000 mg | ORAL_TABLET | Freq: Four times a day (QID) | ORAL | Status: DC | PRN
  Administered 2016-12-05: 650 mg via ORAL
  Filled 2016-12-05: qty 2

## 2016-12-05 MED ORDER — IOPAMIDOL (ISOVUE-300) INJECTION 61%
15.0000 mL | Freq: Two times a day (BID) | INTRAVENOUS | Status: DC | PRN
  Administered 2016-12-05: 15 mL via ORAL
  Filled 2016-12-05: qty 30

## 2016-12-05 NOTE — H&P (View-Only) (Signed)
  General Surgery [facility]  Assessment & Plan: HD#6- partial small bowel obstruction, status post resection metastatic carcinoid tumor   Pt continues to pass gas and have bowel movements, but remains quite distended.   Because of this, low grade temps, and lower left chest pain/upper abdominal pain (under left breast), will get CT chest/abd/pelvis         [name], MD [facility]       Office: [phone]    Chief Complaint: Small bowel obstruction, metastatic carcinoid tumor  Subjective: Tolerated clears and lib and cream of potato soup.    Objective: Vital signs in last 24 hours: Temp:  [98.2 F (36.8 C)-100 F (37.8 C)] 99.5 F (37.5 C) (11/29 0534) Pulse Rate:  [91-97] 91 (11/29 0534) Resp:  [16-18] 18 (11/29 0534) BP: (118-122)/(48-77) 118/76 (11/29 0534) SpO2:  [96 %-98 %] 98 % (11/29 0534) Last BM Date: 12/05/16  Intake/Output from previous day: 11/28 0701 - 11/29 0700 In: 3840 [P.O.:840; I.V.:3000] Out: 1000 [Urine:1000] Intake/Output this shift: Total I/O In: 840 [P.O.:240; I.V.:600] Out: -   Physical Exam: HEENT - sclerae clear, mucous membranes moist Neck - soft Breathing comfortably Abdomen - soft, remains bloated.  Non tender.   Ext - no edema, non-tender Neuro - alert & oriented, no focal deficits  Lab Results:  Recent Labs    12/03/16 0518 12/04/16 0542  WBC 5.6 5.8  HGB 7.5* 7.4*  HCT 25.0* 25.3*  PLT 303 292   BMET Recent Labs    12/03/16 0518 12/04/16 0542  NA 137 137  K 4.4 4.1  CL 106 105  CO2 25 27  GLUCOSE 124* 109*  BUN 6 <5*  CREATININE 0.51 0.56  CALCIUM 8.4* 8.3*   PT/INR No results for input(s): LABPROT, INR in the last 72 hours. Comprehensive Metabolic Panel:    Component Value Date/Time   NA 137 12/04/2016 0542   NA 137 12/03/2016 0518   K 4.1 12/04/2016 0542   K 4.4 12/03/2016 0518   CL 105 12/04/2016 0542   CL 106 12/03/2016 0518   CO2 27  12/04/2016 0542   CO2 25 12/03/2016 0518   BUN <5 (L) 12/04/2016 0542   BUN 6 12/03/2016 0518   CREATININE 0.56 12/04/2016 0542   CREATININE 0.51 12/03/2016 0518   GLUCOSE 109 (H) 12/04/2016 0542   GLUCOSE 124 (H) 12/03/2016 0518   CALCIUM 8.3 (L) 12/04/2016 0542   CALCIUM 8.4 (L) 12/03/2016 0518   AST 20 11/30/2016 1613   AST 26 11/15/2016 0852   ALT 18 11/30/2016 1613   ALT 16 11/15/2016 0852   ALKPHOS 84 11/30/2016 1613   ALKPHOS 56 11/15/2016 0852   BILITOT 0.8 11/30/2016 1613   BILITOT 0.7 11/15/2016 0852   PROT 7.1 11/30/2016 1613   PROT 6.2 (L) 11/15/2016 0852   ALBUMIN 3.5 11/30/2016 1613   ALBUMIN 3.5 11/15/2016 0852    Studies/Results: No results found.    [name] 12/05/2016  Patient ID: Brittney [name], female   DOB: 1965-06-28, 51 y.o.   MRN: [id number]

## 2016-12-05 NOTE — Progress Notes (Signed)
MD paged for patient temperature of 102.5. Patient given 650 mg of PO Tylenol that was previously ordered in the [facility]. Will continue to monitor and await orders.

## 2016-12-05 NOTE — Progress Notes (Signed)
  General Surgery [facility]  Assessment & Plan: HD#6- partial small bowel obstruction, status post resection metastatic carcinoid tumor   Pt continues to pass gas and have bowel movements, but remains quite distended.   Because of this, low grade temps, and lower left chest pain/upper abdominal pain (under left breast), will get CT chest/abd/pelvis         [name], MD [facility]       Office: [phone]    Chief Complaint: Small bowel obstruction, metastatic carcinoid tumor  Subjective: Tolerated clears and lib and cream of potato soup.    Objective: Vital signs in last 24 hours: Temp:  [98.2 F (36.8 C)-100 F (37.8 C)] 99.5 F (37.5 C) (11/29 0534) Pulse Rate:  [91-97] 91 (11/29 0534) Resp:  [16-18] 18 (11/29 0534) BP: (118-122)/(48-77) 118/76 (11/29 0534) SpO2:  [96 %-98 %] 98 % (11/29 0534) Last BM Date: 12/05/16  Intake/Output from previous day: 11/28 0701 - 11/29 0700 In: 3840 [P.O.:840; I.V.:3000] Out: 1000 [Urine:1000] Intake/Output this shift: Total I/O In: 840 [P.O.:240; I.V.:600] Out: -   Physical Exam: HEENT - sclerae clear, mucous membranes moist Neck - soft Breathing comfortably Abdomen - soft, remains bloated.  Non tender.   Ext - no edema, non-tender Neuro - alert & oriented, no focal deficits  Lab Results:  Recent Labs    12/03/16 0518 12/04/16 0542  WBC 5.6 5.8  HGB 7.5* 7.4*  HCT 25.0* 25.3*  PLT 303 292   BMET Recent Labs    12/03/16 0518 12/04/16 0542  NA 137 137  K 4.4 4.1  CL 106 105  CO2 25 27  GLUCOSE 124* 109*  BUN 6 <5*  CREATININE 0.51 0.56  CALCIUM 8.4* 8.3*   PT/INR No results for input(s): LABPROT, INR in the last 72 hours. Comprehensive Metabolic Panel:    Component Value Date/Time   NA 137 12/04/2016 0542   NA 137 12/03/2016 0518   K 4.1 12/04/2016 0542   K 4.4 12/03/2016 0518   CL 105 12/04/2016 0542   CL 106 12/03/2016 0518   CO2 27  12/04/2016 0542   CO2 25 12/03/2016 0518   BUN <5 (L) 12/04/2016 0542   BUN 6 12/03/2016 0518   CREATININE 0.56 12/04/2016 0542   CREATININE 0.51 12/03/2016 0518   GLUCOSE 109 (H) 12/04/2016 0542   GLUCOSE 124 (H) 12/03/2016 0518   CALCIUM 8.3 (L) 12/04/2016 0542   CALCIUM 8.4 (L) 12/03/2016 0518   AST 20 11/30/2016 1613   AST 26 11/15/2016 0852   ALT 18 11/30/2016 1613   ALT 16 11/15/2016 0852   ALKPHOS 84 11/30/2016 1613   ALKPHOS 56 11/15/2016 0852   BILITOT 0.8 11/30/2016 1613   BILITOT 0.7 11/15/2016 0852   PROT 7.1 11/30/2016 1613   PROT 6.2 (L) 11/15/2016 0852   ALBUMIN 3.5 11/30/2016 1613   ALBUMIN 3.5 11/15/2016 0852    Studies/Results: No results found.    [name] 12/05/2016  Patient ID: Brittney [name], female   DOB: 1965/05/04, 51 y.o.   MRN: [id number]

## 2016-12-06 LAB — BASIC METABOLIC PANEL
Anion gap: 10 (ref 5–15)
BUN: 6 mg/dL (ref 6–20)
CO2: 26 mmol/L (ref 22–32)
CREATININE: 0.58 mg/dL (ref 0.44–1.00)
Calcium: 8.7 mg/dL — ABNORMAL LOW (ref 8.9–10.3)
Chloride: 101 mmol/L (ref 101–111)
Glucose, Bld: 99 mg/dL (ref 65–99)
POTASSIUM: 3.9 mmol/L (ref 3.5–5.1)
SODIUM: 137 mmol/L (ref 135–145)

## 2016-12-06 LAB — CBC
HCT: 26.9 % — ABNORMAL LOW (ref 36.0–46.0)
Hemoglobin: 7.9 g/dL — ABNORMAL LOW (ref 12.0–15.0)
MCH: 19.9 pg — AB (ref 26.0–34.0)
MCHC: 29.4 g/dL — ABNORMAL LOW (ref 30.0–36.0)
MCV: 67.8 fL — ABNORMAL LOW (ref 78.0–100.0)
PLATELETS: 512 10*3/uL — AB (ref 150–400)
RBC: 3.97 MIL/uL (ref 3.87–5.11)
RDW: 17.5 % — ABNORMAL HIGH (ref 11.5–15.5)
WBC: 11.7 10*3/uL — ABNORMAL HIGH (ref 4.0–10.5)

## 2016-12-06 MED ORDER — METRONIDAZOLE 500 MG PO TABS: 500.0000 mg | ORAL_TABLET | Freq: Three times a day (TID) | ORAL | 0 refills | Status: DC

## 2016-12-06 NOTE — Progress Notes (Signed)
Discharge instructions reviewed with patient. All questions answered. Patient ambulated down to vehicle with belongings.

## 2016-12-06 NOTE — Discharge Summary (Signed)
Physician Discharge Summary  Patient ID: Brittney [name] MRN: [id number] DOB/AGE: 1965/01/15 51 y.o.  Admit date: 11/30/2016 Discharge date: 12/06/2016  Admission Diagnoses: Patient Active Problem List   Diagnosis Date Noted  . SBO (small bowel obstruction) ([place]) 11/30/2016  . Carcinoid tumor of small intestine, malignant ([place]) 11/21/2016    Discharge Diagnoses:  Active Problems: Ileus Fever  Discharged Condition: stable  Hospital Course: Pt was admitted with probable bowel obstruction post op.  She quickly started having gas and stool, but remained distended.  Because of fever and distention, repeated scan including chest and no cause for fever seen.  The patient was able to keep liquids down without pain or emesis.  She continued to pass gas and started having more formed stool.  She had blood cultures, u/a, and urine cultures which were all negative.  She was d/c'd to home in improved condition with follow up with me, oncology, and radiology for lutathera for malignant carcinoid tumor metastatic to liver.    Consults: oncology and radiology  Significant Diagnostic Studies: labs: see epic.  HCT 26.9, WBCs 11.7   Treatments: IV hydration  Discharge Exam: Blood pressure 128/73, pulse 96, temperature 98.8 F (37.1 C), temperature source Oral, resp. rate 20, height 5\' 5"  (1.651 m), weight 58.1 kg (128 lb), last menstrual period 01/31/2014, SpO2 98 %. General appearance: alert, cooperative and no distress Resp: breathing comfortably GI: soft, less distended, non tender. Extremities: extremities normal, atraumatic, no cyanosis or edema  Disposition: 01-Home or Self Care  Discharge Instructions    Call MD for:  persistant nausea and vomiting   Complete by:  As directed    Call MD for:  redness, tenderness, or signs of infection (pain, swelling, redness, odor or green/yellow discharge around incision site)   Complete by:  As directed    Call MD for:  severe uncontrolled pain    Complete by:  As directed    Call MD for:  temperature >100.4   Complete by:  As directed    Increase activity slowly   Complete by:  As directed      Allergies as of 12/06/2016      Reactions   Other Anaphylaxis, Hives   Tree nuts Peanuts   Peanut-containing Drug Products Anaphylaxis   Trilyte [peg 3350-kcl-na Bicarb-nacl] Other (See Comments)   Throwing up   Penicillins Other (See Comments)   "childhood allergy"  Has patient had a PCN reaction causing immediate rash, facial/tongue/throat swelling, SOB or lightheadedness with hypotension: unkn Has patient had a PCN reaction that required hospitalization: unkn Has patient had a PCN reaction occurring within the last 10 years: unkn If all of the above answers are "NO", then may proceed with Cephalosporin use.   Percocet [oxycodone-acetaminophen] Nausea And Vomiting      Medication List    TAKE these medications   ibuprofen 200 MG tablet Commonly known as:  ADVIL,MOTRIN Take 200 mg by mouth every 6 (six) hours as needed for moderate pain.   IRON PO Take 75 mg 2 (two) times daily by mouth.   JUICE PLUS FIBRE PO Take 2 each by mouth daily. Orchard Blend   JUICE PLUS FIBRE PO Take 2 capsules by mouth daily. Garden Blend   JUICE PLUS FIBRE PO Take 2 each by mouth daily. Vineyard Blend   LORazepam 0.5 MG tablet Commonly known as:  ATIVAN Take 1 tablet (0.5 mg total) by mouth at bedtime as needed for anxiety or sleep.   metroNIDAZOLE 500 MG tablet Commonly known  as:  FLAGYL Take 1 tablet (500 mg total) by mouth 3 (three) times daily for 7 days.   traMADol 50 MG tablet Commonly known as:  ULTRAM Take 1 tablet (50 mg total) every 6 (six) hours as needed by mouth (mild pain).        Signed: [name] 12/06/2016, 8:31 AM

## 2016-12-06 NOTE — Discharge Instructions (Signed)
Full Liquid Diet A full liquid diet may be used:  To help you transition from a clear liquid diet to a soft diet.  When your body is healing and can only tolerate foods that are easy to digest.  Before or after certain a procedure, test, or surgery (such as stomach or intestinal surgeries).  If you have trouble swallowing or chewing.  A full liquid diet includes fluids and foods that are liquid or will become liquid at room temperature. The full liquid diet gives you the proteins, fluids, salts, and minerals that you need for energy. If you continue this diet for more than 72 hours, talk to your health care provider about how many calories you need to consume. If you continue the diet for more than 5 days, talk to your health care provider about taking a multivitamin or a nutritional supplement. What do I need to know about a full liquid diet?  You may have any liquid.  You may have any food that becomes a liquid at room temperature. The food is considered a liquid if it can be poured off a spoon at room temperature.  Drink one serving of citrus or vitamin C-enriched fruit juice daily. What foods can I eat? Grains Any grain food that can be pureed in soup (such as crackers, pasta, and rice). Hot cereal (such as farina or oatmeal) that has been blended. Talk to your health care provider or dietitian about these foods. Vegetables Pulp-free tomato or vegetable juice. Vegetables pureed in soup. Fruits Fruit juice, including nectars and juices with pulp.  Dairy Milk and milk-based beverages, including milk shakes and instant breakfast mixes. Smooth yogurt. Pureed cottage cheese. Avoid these foods if they are not well tolerated. Beverages All beverages, including liquid nutritional supplements. Ask your health care provider if you can have carbonated beverages. They may not be well tolerated. Condiments Iodized salt, pepper, spices, and flavorings. Cocoa powder. Vinegar, ketchup, yellow  mustard, smooth sauces (such as hollandaise, cheese sauce, or white sauce), and soy sauce. Sweets and Desserts Custard, smooth pudding. Flavored gelatin. Tapioca, junket. Plain ice cream, sherbet, fruit ices. Frozen ice pops, frozen fudge pops, pudding pops, and other frozen bars with cream. Syrups, including chocolate syrup. Sugar, honey, jelly. Fats and Oils Margarine, butter, cream, sour cream, and oils. Other Broth and cream soups. Strained, broth-based soups. The items listed above may not be a complete list of recommended foods or beverages. Contact your dietitian for more options. What foods can I not eat? Grains All breads. Grains are not allowed unless they are pureed into soup. Vegetables Vegetables are not allowed unless they are juiced, or cooked and pureed into soup. Fruits Fruits are not allowed unless they are juiced. Meats and Other Protein Sources Any meat or fish. Cooked or raw eggs. Nut butters. Dairy Cheese. Condiments Stone ground mustards. Fats and Oils Fats that are coarse or chunky. Sweets and Desserts Ice cream or other frozen desserts that have any solids in them or on top, such as nuts, chocolate chips, and pieces of cookies. Cakes. Cookies. Candy. Others Soups with chunks or pieces in them. The items listed above may not be a complete list of foods and beverages to avoid. Contact your dietitian for more information. This information is not intended to replace advice given to you by your health care provider. Make sure you discuss any questions you have with your health care provider. Document Released: 12/24/2004 Document Revised: 06/01/2015 Document Reviewed: 10/29/2012 Elsevier Interactive Patient Education  2017  Elsevier Inc. ° °

## 2016-12-11 LAB — CULTURE, BLOOD (ROUTINE X 2)
Culture: NO GROWTH
Culture: NO GROWTH
SPECIAL REQUESTS: ADEQUATE
SPECIAL REQUESTS: ADEQUATE

## 2016-12-12 ENCOUNTER — Ambulatory Visit (HOSPITAL_COMMUNITY)
Admission: RE | Admit: 2016-12-12 | Discharge: 2016-12-12 | Disposition: A | Discharge status: Home / Self Care | Payer: BLUE CROSS/BLUE SHIELD | Source: Ambulatory Visit | Attending: General Surgery | Admitting: General Surgery

## 2016-12-12 ENCOUNTER — Encounter (HOSPITAL_COMMUNITY): Payer: Self-pay | Admitting: *Deleted

## 2016-12-12 ENCOUNTER — Other Ambulatory Visit (HOSPITAL_COMMUNITY): Payer: Self-pay | Admitting: General Surgery

## 2016-12-12 ENCOUNTER — Other Ambulatory Visit: Payer: Self-pay | Admitting: General Surgery

## 2016-12-12 ENCOUNTER — Other Ambulatory Visit: Payer: Self-pay

## 2016-12-12 ENCOUNTER — Telehealth: Payer: Self-pay | Admitting: General Surgery

## 2016-12-12 DIAGNOSIS — R140 Abdominal distension (gaseous): Secondary | ICD-10-CM

## 2016-12-12 NOTE — Telephone Encounter (Signed)
Discussed films with patient.  Given persistence of problem, will do dx lap tomorrow.  Scheduled.

## 2016-12-13 ENCOUNTER — Encounter (HOSPITAL_COMMUNITY)
Admission: AD | Disposition: A | Discharge status: Home / Self Care | Payer: Self-pay | Source: Ambulatory Visit | Attending: General Surgery

## 2016-12-13 ENCOUNTER — Ambulatory Visit (HOSPITAL_COMMUNITY): Payer: BLUE CROSS/BLUE SHIELD | Admitting: Anesthesiology

## 2016-12-13 ENCOUNTER — Inpatient Hospital Stay (HOSPITAL_COMMUNITY)
Admission: AD | Admit: 2016-12-13 | Discharge: 2016-12-16 | DRG: 826 | Disposition: A | Discharge status: Home / Self Care | Payer: BLUE CROSS/BLUE SHIELD | Source: Ambulatory Visit | Attending: General Surgery | Admitting: General Surgery

## 2016-12-13 ENCOUNTER — Encounter (HOSPITAL_COMMUNITY): Payer: Self-pay | Admitting: *Deleted

## 2016-12-13 ENCOUNTER — Other Ambulatory Visit: Payer: Self-pay

## 2016-12-13 DIAGNOSIS — Z6820 Body mass index (BMI) 20.0-20.9, adult: Secondary | ICD-10-CM

## 2016-12-13 DIAGNOSIS — C7A019 Malignant carcinoid tumor of the small intestine, unspecified portion: Secondary | ICD-10-CM | POA: Diagnosis present

## 2016-12-13 DIAGNOSIS — K659 Peritonitis, unspecified: Secondary | ICD-10-CM | POA: Diagnosis present

## 2016-12-13 DIAGNOSIS — K56600 Partial intestinal obstruction, unspecified as to cause: Secondary | ICD-10-CM | POA: Diagnosis present

## 2016-12-13 DIAGNOSIS — C7A8 Other malignant neuroendocrine tumors: Secondary | ICD-10-CM | POA: Diagnosis present

## 2016-12-13 DIAGNOSIS — C787 Secondary malignant neoplasm of liver and intrahepatic bile duct: Secondary | ICD-10-CM | POA: Diagnosis present

## 2016-12-13 DIAGNOSIS — E440 Moderate protein-calorie malnutrition: Secondary | ICD-10-CM | POA: Diagnosis present

## 2016-12-13 DIAGNOSIS — R188 Other ascites: Secondary | ICD-10-CM | POA: Diagnosis present

## 2016-12-13 DIAGNOSIS — D62 Acute posthemorrhagic anemia: Secondary | ICD-10-CM | POA: Diagnosis present

## 2016-12-13 HISTORY — PX: LAPAROSCOPY: SHX197

## 2016-12-13 HISTORY — PX: BOWEL RESECTION: SHX1257

## 2016-12-13 LAB — CBC
HCT: 28 % — ABNORMAL LOW (ref 36.0–46.0)
Hemoglobin: 8.3 g/dL — ABNORMAL LOW (ref 12.0–15.0)
MCH: 20 pg — AB (ref 26.0–34.0)
MCHC: 29.6 g/dL — AB (ref 30.0–36.0)
MCV: 67.5 fL — AB (ref 78.0–100.0)
PLATELETS: 571 10*3/uL — AB (ref 150–400)
RBC: 4.15 MIL/uL (ref 3.87–5.11)
RDW: 18 % — AB (ref 11.5–15.5)
WBC: 6.6 10*3/uL (ref 4.0–10.5)

## 2016-12-13 LAB — BASIC METABOLIC PANEL
Anion gap: 15 (ref 5–15)
BUN: 8 mg/dL (ref 6–20)
CHLORIDE: 104 mmol/L (ref 101–111)
CO2: 19 mmol/L — AB (ref 22–32)
CREATININE: 0.56 mg/dL (ref 0.44–1.00)
Calcium: 8.4 mg/dL — ABNORMAL LOW (ref 8.9–10.3)
GFR calc Af Amer: >60 mL/min (ref >60)
GFR calc non Af Amer: >60 mL/min (ref >60)
GLUCOSE: 85 mg/dL (ref 65–99)
Potassium: 3.9 mmol/L (ref 3.5–5.1)
SODIUM: 138 mmol/L (ref 135–145)

## 2016-12-13 SURGERY — LAPAROSCOPY, DIAGNOSTIC
Anesthesia: General

## 2016-12-13 MED ORDER — MIDAZOLAM HCL 5 MG/5ML IJ SOLN
INTRAMUSCULAR | Status: DC | PRN
  Administered 2016-12-13: 2 mg via INTRAVENOUS

## 2016-12-13 MED ORDER — IBUPROFEN 200 MG PO TABS
400.0000 mg | ORAL_TABLET | Freq: Four times a day (QID) | ORAL | Status: DC | PRN
  Administered 2016-12-15 (×3): 400 mg via ORAL
  Filled 2016-12-13 (×3): qty 2

## 2016-12-13 MED ORDER — CHLORHEXIDINE GLUCONATE CLOTH 2 % EX PADS: 6.0000 | MEDICATED_PAD | Freq: Once | CUTANEOUS | Status: DC

## 2016-12-13 MED ORDER — POTASSIUM CHLORIDE IN NACL 20-0.9 MEQ/L-% IV SOLN
INTRAVENOUS | Status: DC
  Administered 2016-12-13: 1000 mL via INTRAVENOUS
  Administered 2016-12-14 – 2016-12-16 (×3): via INTRAVENOUS
  Filled 2016-12-13 (×6): qty 1000

## 2016-12-13 MED ORDER — PROPOFOL 10 MG/ML IV BOLUS
INTRAVENOUS | Status: AC
  Filled 2016-12-13: qty 20

## 2016-12-13 MED ORDER — SUGAMMADEX SODIUM 200 MG/2ML IV SOLN
INTRAVENOUS | Status: AC
  Filled 2016-12-13: qty 2

## 2016-12-13 MED ORDER — HYDROMORPHONE HCL 1 MG/ML IJ SOLN
INTRAMUSCULAR | Status: AC
  Filled 2016-12-13: qty 1

## 2016-12-13 MED ORDER — PROPOFOL 10 MG/ML IV BOLUS
INTRAVENOUS | Status: DC | PRN
  Administered 2016-12-13: 120 mg via INTRAVENOUS

## 2016-12-13 MED ORDER — ROCURONIUM BROMIDE 50 MG/5ML IV SOSY
PREFILLED_SYRINGE | INTRAVENOUS | Status: AC
  Filled 2016-12-13: qty 5

## 2016-12-13 MED ORDER — DIPHENHYDRAMINE HCL 50 MG/ML IJ SOLN: 12.5000 mg | Freq: Four times a day (QID) | INTRAMUSCULAR | Status: DC | PRN

## 2016-12-13 MED ORDER — LIDOCAINE 2% (20 MG/ML) 5 ML SYRINGE
INTRAMUSCULAR | Status: DC | PRN
  Administered 2016-12-13: 100 mg via INTRAVENOUS

## 2016-12-13 MED ORDER — ACETAMINOPHEN 500 MG PO TABS
1000.0000 mg | ORAL_TABLET | Freq: Four times a day (QID) | ORAL | Status: DC
  Administered 2016-12-13 – 2016-12-16 (×12): 1000 mg via ORAL
  Filled 2016-12-13 (×12): qty 2

## 2016-12-13 MED ORDER — ONDANSETRON HCL 4 MG/2ML IJ SOLN
INTRAMUSCULAR | Status: AC
  Filled 2016-12-13: qty 2

## 2016-12-13 MED ORDER — SUCCINYLCHOLINE CHLORIDE 200 MG/10ML IV SOSY
PREFILLED_SYRINGE | INTRAVENOUS | Status: DC | PRN
  Administered 2016-12-13: 100 mg via INTRAVENOUS

## 2016-12-13 MED ORDER — 0.9 % SODIUM CHLORIDE (POUR BTL) OPTIME
TOPICAL | Status: DC | PRN
  Administered 2016-12-13: 3000 mL

## 2016-12-13 MED ORDER — SODIUM CHLORIDE 0.9 % IJ SOLN
INTRAMUSCULAR | Status: AC
  Filled 2016-12-13: qty 10

## 2016-12-13 MED ORDER — GABAPENTIN 300 MG PO CAPS
300.0000 mg | ORAL_CAPSULE | Freq: Two times a day (BID) | ORAL | Status: DC
  Administered 2016-12-13 – 2016-12-16 (×6): 300 mg via ORAL
  Filled 2016-12-13 (×6): qty 1

## 2016-12-13 MED ORDER — ACETAMINOPHEN 500 MG PO TABS
1000.0000 mg | ORAL_TABLET | ORAL | Status: AC
  Administered 2016-12-13: 1000 mg via ORAL
  Filled 2016-12-13: qty 2

## 2016-12-13 MED ORDER — METHOCARBAMOL 500 MG PO TABS: 500.0000 mg | ORAL_TABLET | Freq: Four times a day (QID) | ORAL | Status: DC | PRN

## 2016-12-13 MED ORDER — HYDROMORPHONE HCL 1 MG/ML IJ SOLN
INTRAMUSCULAR | Status: AC
Start: 2016-12-13 — End: 2016-12-14
  Filled 2016-12-13: qty 1

## 2016-12-13 MED ORDER — MIDAZOLAM HCL 2 MG/2ML IJ SOLN
INTRAMUSCULAR | Status: AC
  Filled 2016-12-13: qty 2

## 2016-12-13 MED ORDER — FENTANYL CITRATE (PF) 100 MCG/2ML IJ SOLN
INTRAMUSCULAR | Status: AC
  Filled 2016-12-13: qty 2

## 2016-12-13 MED ORDER — SUGAMMADEX SODIUM 200 MG/2ML IV SOLN
INTRAVENOUS | Status: DC | PRN
  Administered 2016-12-13: 200 mg via INTRAVENOUS

## 2016-12-13 MED ORDER — GABAPENTIN 300 MG PO CAPS
300.0000 mg | ORAL_CAPSULE | ORAL | Status: AC
  Administered 2016-12-13: 300 mg via ORAL
  Filled 2016-12-13: qty 1

## 2016-12-13 MED ORDER — DEXAMETHASONE SODIUM PHOSPHATE 10 MG/ML IJ SOLN
INTRAMUSCULAR | Status: DC | PRN
  Administered 2016-12-13: 10 mg via INTRAVENOUS

## 2016-12-13 MED ORDER — TRAMADOL HCL 50 MG PO TABS
50.0000 mg | ORAL_TABLET | Freq: Four times a day (QID) | ORAL | Status: DC | PRN
  Administered 2016-12-14 (×2): 50 mg via ORAL
  Filled 2016-12-13 (×2): qty 1

## 2016-12-13 MED ORDER — BUPIVACAINE-EPINEPHRINE 0.25% -1:200000 IJ SOLN
INTRAMUSCULAR | Status: DC | PRN
  Administered 2016-12-13: 20 mL

## 2016-12-13 MED ORDER — CIPROFLOXACIN IN D5W 400 MG/200ML IV SOLN
400.0000 mg | Freq: Two times a day (BID) | INTRAVENOUS | Status: DC
  Administered 2016-12-13 – 2016-12-16 (×6): 400 mg via INTRAVENOUS
  Filled 2016-12-13 (×6): qty 200

## 2016-12-13 MED ORDER — RINGERS IRRIGATION IR SOLN
Status: DC | PRN
  Administered 2016-12-13: 1000 mL

## 2016-12-13 MED ORDER — LIDOCAINE 2% (20 MG/ML) 5 ML SYRINGE
INTRAMUSCULAR | Status: AC
  Filled 2016-12-13: qty 5

## 2016-12-13 MED ORDER — ONDANSETRON HCL 4 MG/2ML IJ SOLN: 4.0000 mg | Freq: Four times a day (QID) | INTRAMUSCULAR | Status: DC | PRN

## 2016-12-13 MED ORDER — MORPHINE SULFATE (PF) 2 MG/ML IV SOLN: 1.0000 mg | INTRAVENOUS | Status: DC | PRN

## 2016-12-13 MED ORDER — PROMETHAZINE HCL 25 MG/ML IJ SOLN: 6.2500 mg | INTRAMUSCULAR | Status: DC | PRN

## 2016-12-13 MED ORDER — ONDANSETRON HCL 4 MG/2ML IJ SOLN
INTRAMUSCULAR | Status: DC | PRN
  Administered 2016-12-13: 4 mg via INTRAVENOUS

## 2016-12-13 MED ORDER — SUCCINYLCHOLINE CHLORIDE 200 MG/10ML IV SOSY
PREFILLED_SYRINGE | INTRAVENOUS | Status: AC
  Filled 2016-12-13: qty 10

## 2016-12-13 MED ORDER — ROCURONIUM BROMIDE 10 MG/ML (PF) SYRINGE
PREFILLED_SYRINGE | INTRAVENOUS | Status: DC | PRN
  Administered 2016-12-13: 10 mg via INTRAVENOUS
  Administered 2016-12-13: 30 mg via INTRAVENOUS
  Administered 2016-12-13: 10 mg via INTRAVENOUS

## 2016-12-13 MED ORDER — LACTATED RINGERS IV SOLN
INTRAVENOUS | Status: DC
  Administered 2016-12-13: 1000 mL via INTRAVENOUS
  Administered 2016-12-13 (×2): via INTRAVENOUS

## 2016-12-13 MED ORDER — BUPIVACAINE-EPINEPHRINE (PF) 0.25% -1:200000 IJ SOLN
INTRAMUSCULAR | Status: AC
  Filled 2016-12-13: qty 30

## 2016-12-13 MED ORDER — DIPHENHYDRAMINE HCL 12.5 MG/5ML PO ELIX: 12.5000 mg | ORAL_SOLUTION | Freq: Four times a day (QID) | ORAL | Status: DC | PRN

## 2016-12-13 MED ORDER — KETOROLAC TROMETHAMINE 30 MG/ML IJ SOLN: 30.0000 mg | Freq: Once | INTRAMUSCULAR | Status: DC | PRN

## 2016-12-13 MED ORDER — FENTANYL CITRATE (PF) 100 MCG/2ML IJ SOLN
INTRAMUSCULAR | Status: DC | PRN
  Administered 2016-12-13: 50 ug via INTRAVENOUS
  Administered 2016-12-13: 100 ug via INTRAVENOUS
  Administered 2016-12-13: 50 ug via INTRAVENOUS

## 2016-12-13 MED ORDER — BUPIVACAINE LIPOSOME 1.3 % IJ SUSP
20.0000 mL | Freq: Once | INTRAMUSCULAR | Status: AC
  Administered 2016-12-13: 20 mL
  Filled 2016-12-13: qty 20

## 2016-12-13 MED ORDER — ONDANSETRON 4 MG PO TBDP: 4.0000 mg | ORAL_TABLET | Freq: Four times a day (QID) | ORAL | Status: DC | PRN

## 2016-12-13 MED ORDER — CIPROFLOXACIN IN D5W 400 MG/200ML IV SOLN
400.0000 mg | INTRAVENOUS | Status: AC
  Administered 2016-12-13: 400 mg via INTRAVENOUS
  Filled 2016-12-13: qty 200

## 2016-12-13 MED ORDER — HYDROMORPHONE HCL 1 MG/ML IJ SOLN
0.2500 mg | INTRAMUSCULAR | Status: DC | PRN
  Administered 2016-12-13 (×4): 0.5 mg via INTRAVENOUS

## 2016-12-13 MED ORDER — LORAZEPAM 0.5 MG PO TABS
0.5000 mg | ORAL_TABLET | Freq: Every day | ORAL | Status: DC
  Administered 2016-12-13 – 2016-12-15 (×3): 0.5 mg via ORAL
  Filled 2016-12-13 (×3): qty 1

## 2016-12-13 MED ORDER — ENOXAPARIN SODIUM 40 MG/0.4ML ~~LOC~~ SOLN
40.0000 mg | SUBCUTANEOUS | Status: DC
  Administered 2016-12-14 – 2016-12-16 (×3): 40 mg via SUBCUTANEOUS
  Filled 2016-12-13 (×3): qty 0.4

## 2016-12-13 MED ORDER — DEXAMETHASONE SODIUM PHOSPHATE 10 MG/ML IJ SOLN
INTRAMUSCULAR | Status: AC
  Filled 2016-12-13: qty 1

## 2016-12-13 SURGICAL SUPPLY — 44 items
ADH SKN CLS APL DERMABOND .7 (GAUZE/BANDAGES/DRESSINGS) ×1
APL SKNCLS STERI-STRIP NONHPOA (GAUZE/BANDAGES/DRESSINGS)
BENZOIN TINCTURE PRP APPL 2/3 (GAUZE/BANDAGES/DRESSINGS) IMPLANT
CELLS DAT CNTRL 66122 CELL SVR (MISCELLANEOUS) ×1 IMPLANT
CLOSURE WOUND 1/2 X4 (GAUZE/BANDAGES/DRESSINGS)
COVER SURGICAL LIGHT HANDLE (MISCELLANEOUS) ×3 IMPLANT
DECANTER SPIKE VIAL GLASS SM (MISCELLANEOUS) IMPLANT
DERMABOND ADVANCED (GAUZE/BANDAGES/DRESSINGS) ×2
DERMABOND ADVANCED .7 DNX12 (GAUZE/BANDAGES/DRESSINGS) IMPLANT
DRAPE TOWEL STR TPT 18X26 WHT (DRAPES) ×6 IMPLANT
DRSG OPSITE POSTOP 4X6 (GAUZE/BANDAGES/DRESSINGS) ×2 IMPLANT
ELECT REM PT RETURN 15FT ADLT (MISCELLANEOUS) ×3 IMPLANT
GLOVE BIO SURGEON STRL SZ 6 (GLOVE) ×3 IMPLANT
GLOVE INDICATOR 6.5 STRL GRN (GLOVE) ×6 IMPLANT
GOWN STRL REIN 2XL LVL4 (GOWN DISPOSABLE) ×3 IMPLANT
GOWN STRL REUS W/ TWL XL LVL3 (GOWN DISPOSABLE) ×3 IMPLANT
GOWN STRL REUS W/TWL 2XL LVL3 (GOWN DISPOSABLE) ×3 IMPLANT
GOWN STRL REUS W/TWL XL LVL3 (GOWN DISPOSABLE) ×9
IRRIG SUCT STRYKERFLOW 2 WTIP (MISCELLANEOUS)
IRRIGATION SUCT STRKRFLW 2 WTP (MISCELLANEOUS) IMPLANT
KIT BASIN OR (CUSTOM PROCEDURE TRAY) ×3 IMPLANT
LIGASURE IMPACT 36 18CM CVD LR (INSTRUMENTS) ×2 IMPLANT
RELOAD PROXIMATE 75MM BLUE (ENDOMECHANICALS) ×9 IMPLANT
RELOAD STAPLE 75 3.8 BLU REG (ENDOMECHANICALS) IMPLANT
RETRACTOR WND ALEXIS 18 MED (MISCELLANEOUS) IMPLANT
RTRCTR WOUND ALEXIS 18CM MED (MISCELLANEOUS) ×3
SHEARS HARMONIC ACE PLUS 36CM (ENDOMECHANICALS) IMPLANT
SOLUTION ANTI FOG 6CC (MISCELLANEOUS) ×3 IMPLANT
STAPLER GUN LINEAR PROX 60 (STAPLE) ×2 IMPLANT
STAPLER PROXIMATE 75MM BLUE (STAPLE) ×4 IMPLANT
STRIP CLOSURE SKIN 1/2X4 (GAUZE/BANDAGES/DRESSINGS) IMPLANT
SUT PDS AB 1 CTX 36 (SUTURE) ×4 IMPLANT
SUT VIC AB 2-0 SH 27 (SUTURE) ×6
SUT VIC AB 2-0 SH 27X BRD (SUTURE) IMPLANT
SUT VIC AB 3-0 SH 18 (SUTURE) ×4 IMPLANT
SUT VIC AB 4-0 PS2 27 (SUTURE) IMPLANT
TOWEL OR 17X26 10 PK STRL BLUE (TOWEL DISPOSABLE) ×9 IMPLANT
TRAY FOLEY W/METER SILVER 16FR (SET/KITS/TRAYS/PACK) IMPLANT
TRAY LAPAROSCOPIC (CUSTOM PROCEDURE TRAY) ×3 IMPLANT
TROCAR XCEL BLUNT TIP 100MML (ENDOMECHANICALS) ×3 IMPLANT
TROCAR XCEL NON-BLD 11X100MML (ENDOMECHANICALS) IMPLANT
TROCAR XCEL UNIV SLVE 11M 100M (ENDOMECHANICALS) IMPLANT
TUBING INSUF HEATED (TUBING) ×3 IMPLANT
WATER STERILE IRR 1000ML POUR (IV SOLUTION) ×3 IMPLANT

## 2016-12-13 NOTE — Anesthesia Procedure Notes (Signed)
Procedure Name: Intubation Date/Time: 12/13/2016 12:23 PM Performed by: Lind Covert, CRNA Pre-anesthesia Checklist: Patient identified, Emergency Drugs available, Suction available, Patient being monitored and Timeout performed Patient Re-evaluated:Patient Re-evaluated prior to induction Oxygen Delivery Method: Circle system utilized Preoxygenation: Pre-oxygenation with 100% oxygen Induction Type: IV induction, Rapid sequence and Cricoid Pressure applied Laryngoscope Size: Mac and 3 Tube type: Oral Tube size: 7.0 mm Number of attempts: 1 Airway Equipment and Method: Stylet Placement Confirmation: ETT inserted through vocal cords under direct vision,  positive ETCO2 and breath sounds checked- equal and bilateral Secured at: 22 cm Tube secured with: Tape Dental Injury: Teeth and Oropharynx as per pre-operative assessment

## 2016-12-13 NOTE — Transfer of Care (Signed)
Immediate Anesthesia Transfer of Care Note  Patient: Brittney [name]  Procedure(s) Performed: LAPAROSCOPY DIAGNOSTIC (N/A ) POSSIBLE LYSIS OF ADHESION (N/A ) POSSIBLE SMALL BOWEL RESECTION (N/A )  Patient Location: PACU  Anesthesia Type:General  Level of Consciousness: sedated  Airway & Oxygen Therapy: Patient Spontanous Breathing and Patient connected to face mask oxygen  Post-op Assessment: Report given to RN and Post -op Vital signs reviewed and stable  Post vital signs: Reviewed and stable  Last Vitals:  Vitals:   12/13/16 0927  BP: 106/70  Pulse: 89  Resp: 16  Temp: 36.8 C  SpO2: 100%    Last Pain:  Vitals:   12/13/16 0927  TempSrc: Oral      Patients Stated Pain Goal: 4 (44/96/75 9163)  Complications: No apparent anesthesia complications

## 2016-12-13 NOTE — Anesthesia Postprocedure Evaluation (Signed)
Anesthesia Post Note  Patient: Brittney [name]  Procedure(s) Performed: LAPAROSCOPY DIAGNOSTIC (N/A ) SMALL BOWEL RESECTION (N/A )     Patient location during evaluation: PACU Anesthesia Type: General Level of consciousness: awake and alert Pain management: pain level controlled Vital Signs Assessment: post-procedure vital signs reviewed and stable Respiratory status: spontaneous breathing, nonlabored ventilation, respiratory function stable and patient connected to nasal cannula oxygen Cardiovascular status: blood pressure returned to baseline and stable Postop Assessment: no apparent nausea or vomiting Anesthetic complications: no    Last Vitals:  Vitals:   12/13/16 0927 12/13/16 1419  BP: 106/70 108/69  Pulse: 89 88  Resp: 16 20  Temp: 36.8 C 36.4 C  SpO2: 100% 100%    Last Pain:  Vitals:   12/13/16 1445  TempSrc:   PainSc: 7                  Montez Hageman

## 2016-12-13 NOTE — Op Note (Signed)
PRE-OPERATIVE DIAGNOSIS: partial small bowel obstruction  POST-OPERATIVE DIAGNOSIS:  Same  PROCEDURE:  Procedure(s): Diagnostic laparoscopy, small bowel resection (resection of prior ileal anastamosis), drainage of cloudy ascites  SURGEON:  Surgeon(s): [name], MD  ANESTHESIA:   local and general  DRAINS: none   LOCAL MEDICATIONS USED:  OTHER marcaine and exparel  SPECIMEN:  Source of Specimen:  small bowel anastamosis  DISPOSITION OF SPECIMEN:  PATHOLOGY  COUNTS:  YES  DICTATION: .Dragon Dictation  PLAN OF CARE: Admit to inpatient   PATIENT DISPOSITION:  PACU - hemodynamically stable.  FINDINGS:  Cloudy ascites with rind along peritoneum in pelvis.    EBL: min  PROCEDURE:   Patient was identified in the holding area and taken to the operating room where she was placed supine on the operating room table.  General endotracheal anesthesia was induced.  Her arms were tucked, a Foley catheter was placed, and her abdomen was prepped and draped in sterile fashion.  A timeout was performed according to the surgical safety checklist.  When all was correct, we continued.  The patient was placed into reverse Trendelenburg position and rotated to the right.  A 5 mm Optiview trocar was placed under direct visualization in the left upper quadrant at her previous Optiview incision.  The abdomen was insufflated.  Two other 5 mm trocars were placed at the previous trocar sites in the left abdomen.  The right lower quadrant was evaluated first.  There was inflammatory rind along some of the small bowel and in the pelvis.  The small bowel was run and there was an area of change in caliber of the bowel associated with the prior anastomosis.  There was an area that appeared to be tight and inflamed.  The bowel did appear to have reasonable blood flow so it is not clear why this area appeared so tight.  The peritoneal fluid was sent for culture due to the cloudy nature and the inflammatory  rind.  The fluid was washed out and the rind peel off as much as possible.  The abdomen was irrigated with multiple liters of saline.  There was also a significant pocket of fluid in the left upper quadrant.  This was not walled off but it was irrigated copiously given the fact that this was one of the locations of her pain.  The spleen appeared to be well perfused.  The stomach appeared fine.  There was the known left-sided neuroendocrine mass.  Because of the appearance of the distal small bowel at the anastomosis and the patient's stagnant condition and bloating, the small infraumbilical incision was reopened.  The anastomosis was examined directly visually and manually.  The area just past the prior anastomosis did appear to be quite inflamed and firm.  Since this was the site of the transition point in the bowel, this was resected.  A isoperistaltic side-to-side stapled anastomosis was created.  This was returned to the abdomen.  The abdomen was then reirrigated.  The fascia was closed with #1 PDS suture.  The incisions were then closed with staples.  The abdomen was cleaned, dried, and dressed with dry sterile dressings.  The patient was allowed to emerge from anesthesia and taken to the PACU in stable condition.  Needle, sponge, and instrument counts were correct x2.

## 2016-12-13 NOTE — Anesthesia Preprocedure Evaluation (Signed)
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    Pulmonary neg pulmonary ROS,    Pulmonary exam normal breath sounds clear to auscultation       Cardiovascular negative cardio ROS Normal cardiovascular exam Rhythm:Regular Rate:Normal     Neuro/Psych negative neurological ROS  negative psych ROS   GI/Hepatic negative GI ROS, Neg liver ROS,   Endo/Other  negative endocrine ROS  Renal/GU negative Renal ROS  negative genitourinary   Musculoskeletal negative musculoskeletal ROS (+)   Abdominal   Peds negative pediatric ROS (+)  Hematology  (+) anemia ,   Anesthesia Other Findings   Reproductive/Obstetrics negative OB ROS                             Anesthesia Physical Anesthesia Plan  ASA: III  Anesthesia Plan: General   Post-op Pain Management:    Induction: Intravenous  PONV Risk Score and Plan: 3 and Ondansetron, Dexamethasone, Midazolam and Treatment may vary due to age or medical condition  Airway Management Planned: Oral ETT  Additional Equipment:   Intra-op Plan:   Post-operative Plan: Extubation in OR  Informed Consent: I have reviewed the patients History and Physical, chart, labs and discussed the procedure including the risks, benefits and alternatives for the proposed anesthesia with the patient or authorized representative who has indicated his/her understanding and acceptance.   Dental advisory given  Plan Discussed with: CRNA and Surgeon  Anesthesia Plan Comments:          Anesthesia Quick Evaluation

## 2016-12-13 NOTE — Interval H&P Note (Signed)
History and Physical Interval Note:  12/13/2016 11:46 AM  Brittney [name]  has presented today for surgery, with the diagnosis of PARTIAL SMALL BOWEL OBSTRUCTION  The various methods of treatment have been discussed with the patient and family. After consideration of risks, benefits and other options for treatment, the patient has consented to  Procedure(s): LAPAROSCOPY DIAGNOSTIC (N/A) POSSIBLE LYSIS OF ADHESION (N/A) POSSIBLE SMALL BOWEL RESECTION (N/A) as a surgical intervention .  The patient's history has been reviewed, patient examined, no change in status, stable for surgery.  I have reviewed the patient's chart and labs.  Questions were answered to the patient's satisfaction.     [name]

## 2016-12-14 LAB — BASIC METABOLIC PANEL
ANION GAP: 6 (ref 5–15)
BUN: 6 mg/dL (ref 6–20)
CHLORIDE: 102 mmol/L (ref 101–111)
CO2: 27 mmol/L (ref 22–32)
Calcium: 8.1 mg/dL — ABNORMAL LOW (ref 8.9–10.3)
Creatinine, Ser: 0.42 mg/dL — ABNORMAL LOW (ref 0.44–1.00)
GFR calc non Af Amer: >60 mL/min (ref >60)
GLUCOSE: 179 mg/dL — AB (ref 65–99)
Potassium: 4.3 mmol/L (ref 3.5–5.1)
Sodium: 135 mmol/L (ref 135–145)

## 2016-12-14 LAB — CBC
HEMATOCRIT: 24.5 % — AB (ref 36.0–46.0)
HEMOGLOBIN: 7.2 g/dL — AB (ref 12.0–15.0)
MCH: 19.9 pg — ABNORMAL LOW (ref 26.0–34.0)
MCHC: 29.4 g/dL — ABNORMAL LOW (ref 30.0–36.0)
MCV: 67.7 fL — AB (ref 78.0–100.0)
Platelets: 634 10*3/uL — ABNORMAL HIGH (ref 150–400)
RBC: 3.62 MIL/uL — ABNORMAL LOW (ref 3.87–5.11)
RDW: 18.3 % — AB (ref 11.5–15.5)
WBC: 11.3 10*3/uL — AB (ref 4.0–10.5)

## 2016-12-14 NOTE — Progress Notes (Signed)
Assessment Partial small bowel obstruction s/p laparoscopic assisted resection of prior small bowel anastomosis 12/13/16 (Dr. Floydene Flock bloating then preop; peritoneal fluid culture pending   S/p Laparoscopic small bowel resection for malignant carcinoid tumor 11/21/16   Plan:  Ambulate.  Continue liquids until bowel function returns.   LOS: 1 day     1 Day Post-Op  Chief Complaint/Subjective: Adequate pain control.  No flatus.  Taking in some liquids.  Objective: Vital signs in last 24 hours: Temp:  [97.5 F (36.4 C)-98.3 F (36.8 C)] 97.8 F (36.6 C) (12/08 0528) Pulse Rate:  [71-95] 80 (12/08 0528) Resp:  [10-22] 18 (12/08 0528) BP: (104-122)/(67-78) 107/67 (12/08 0528) SpO2:  [99 %-100 %] 100 % (12/08 0528) Last BM Date: 12/13/16  Intake/Output from previous day: 12/07 0701 - 12/08 0700 In: 3932.1 [P.O.:480; I.V.:3252.1; IV Piggyback:200] Out: 1135 [Urine:1085; Blood:50] Intake/Output this shift: No intake/output data recorded.  PE: General- In NAD.  Awake and alert. Abdomen- soft, some distension, hypoactive bowel sounds, midline wound clean  Lab Results:  Recent Labs    12/13/16 0932 12/14/16 0427  WBC 6.6 11.3*  HGB 8.3* 7.2*  HCT 28.0* 24.5*  PLT 571* 634*   BMET Recent Labs    12/13/16 0932 12/14/16 0427  NA 138 135  K 3.9 4.3  CL 104 102  CO2 19* 27  GLUCOSE 85 179*  BUN 8 6  CREATININE 0.56 0.42*  CALCIUM 8.4* 8.1*   PT/INR No results for input(s): LABPROT, INR in the last 72 hours. Comprehensive Metabolic Panel:    Component Value Date/Time   NA 135 12/14/2016 0427   NA 138 12/13/2016 0932   K 4.3 12/14/2016 0427   K 3.9 12/13/2016 0932   CL 102 12/14/2016 0427   CL 104 12/13/2016 0932   CO2 27 12/14/2016 0427   CO2 19 (L) 12/13/2016 0932   BUN 6 12/14/2016 0427   BUN 8 12/13/2016 0932   CREATININE 0.42 (L) 12/14/2016 0427   CREATININE 0.56 12/13/2016 0932   GLUCOSE 179 (H) 12/14/2016 0427   GLUCOSE 85 12/13/2016 0932   CALCIUM 8.1 (L) 12/14/2016 0427   CALCIUM 8.4 (L) 12/13/2016 0932   AST 20 11/30/2016 1613   AST 26 11/15/2016 0852   ALT 18 11/30/2016 1613   ALT 16 11/15/2016 0852   ALKPHOS 84 11/30/2016 1613   ALKPHOS 56 11/15/2016 0852   BILITOT 0.8 11/30/2016 1613   BILITOT 0.7 11/15/2016 0852   PROT 7.1 11/30/2016 1613   PROT 6.2 (L) 11/15/2016 0852   ALBUMIN 3.5 11/30/2016 1613   ALBUMIN 3.5 11/15/2016 1950     Studies/Results: Dg Abd Acute W/chest  Result Date: 12/12/2016 CLINICAL DATA:  Abdominal distension and history of 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 EXAM: DG ABDOMEN ACUTE W/ 1V CHEST COMPARISON:  12/05/2016 CT of the abdomen and pelvis FINDINGS: Lungs show mild bibasilar atelectasis. There is no evidence of pulmonary edema, consolidation, pneumothorax, nodule or pleural fluid. The heart size is normal. Multiple dilated small bowel loops identified containing air-fluid levels. Maximal small bowel caliber is 6 cm in the left upper quadrant. No significant gas identified in the colon and findings are suggestive of a small bowel obstruction. No signs of free air or visible pneumatosis. Stable line related to prior small bowel anastomosis identified in the right mid abdomen. IUD present in the pelvis. IMPRESSION: Dilated small bowel containing air-fluid levels with maximal small bowel caliber of 6 cm. Findings are consistent with  small bowel obstruction. No evidence of free intraperitoneal air. Electronically Signed   By: Aletta Edouard M.D.   On: 12/12/2016 10:19    Anti-infectives: Anti-infectives (From admission, onward)   Start     Dose/Rate Route Frequency Ordered Stop   12/14/16 0000  ciprofloxacin (CIPRO) IVPB 400 mg     400 mg 200 mL/hr over 60 Minutes Intravenous Every 12 hours 12/13/16 1623     12/13/16 0924  ciprofloxacin (CIPRO) IVPB 400 mg     400 mg 200 mL/hr over 60 Minutes Intravenous On call to O.R. 12/13/16  0924 12/13/16 1230       Lanice Folden 12/14/2016

## 2016-12-15 LAB — CBC
HEMATOCRIT: 23.6 % — AB (ref 36.0–46.0)
Hemoglobin: 6.9 g/dL — CL (ref 12.0–15.0)
MCH: 20 pg — ABNORMAL LOW (ref 26.0–34.0)
MCHC: 29.2 g/dL — AB (ref 30.0–36.0)
MCV: 68.4 fL — ABNORMAL LOW (ref 78.0–100.0)
Platelets: 641 10*3/uL — ABNORMAL HIGH (ref 150–400)
RBC: 3.45 MIL/uL — ABNORMAL LOW (ref 3.87–5.11)
RDW: 18.3 % — AB (ref 11.5–15.5)
WBC: 7.5 10*3/uL (ref 4.0–10.5)

## 2016-12-15 LAB — BASIC METABOLIC PANEL
ANION GAP: 7 (ref 5–15)
BUN: 5 mg/dL — AB (ref 6–20)
CO2: 26 mmol/L (ref 22–32)
CREATININE: 0.48 mg/dL (ref 0.44–1.00)
Calcium: 7.8 mg/dL — ABNORMAL LOW (ref 8.9–10.3)
Chloride: 104 mmol/L (ref 101–111)
GFR calc non Af Amer: >60 mL/min (ref >60)
Glucose, Bld: 96 mg/dL (ref 65–99)
Potassium: 3.9 mmol/L (ref 3.5–5.1)
SODIUM: 137 mmol/L (ref 135–145)

## 2016-12-15 NOTE — Progress Notes (Signed)
Assessment Partial small bowel obstruction s/p laparoscopic assisted resection of prior small bowel anastomosis 12/13/16 (Dr. Floydene Flock bloating then preop; peritoneal fluid culture still pending; no bowel function yet   S/p Laparoscopic small bowel resection for malignant carcinoid tumor 11/21/16  ABL and chronic anemia-asx   Plan:  Advance to full liquids.  Check hemoglobin tomorrow.  LOS: 2 days     2 Days Post-Op  Chief Complaint/Subjective: No BM or flatus.  Tolerating clear liquids.  Walking.  No dizziness.  Still with some bloating. Husband and daughter in room.  Objective: Vital signs in last 24 hours: Temp:  [98.2 F (36.8 C)-98.5 F (36.9 C)] 98.5 F (36.9 C) (12/09 0515) Pulse Rate:  [77-82] 82 (12/09 0515) Resp:  [16-18] 16 (12/09 0515) BP: (109-112)/(73-76) 111/73 (12/09 0515) SpO2:  [97 %-100 %] 97 % (12/09 0515) Last BM Date: 12/13/16  Intake/Output from previous day: 12/08 0701 - 12/09 0700 In: 3513.3 [P.O.:840; I.V.:2473.3; IV Piggyback:200] Out: 2350 [Urine:2350] Intake/Output this shift: Total I/O In: 360 [P.O.:360] Out: 700 [Urine:700]  PE: General- In NAD.  Awake and alert. Abdomen- soft, no change in distension, still with hypoactive bowel sounds, midline wound clean  Lab Results:  Recent Labs    12/14/16 0427 12/15/16 0507  WBC 11.3* 7.5  HGB 7.2* 6.9*  HCT 24.5* 23.6*  PLT 634* 641*   BMET Recent Labs    12/14/16 0427 12/15/16 0507  NA 135 137  K 4.3 3.9  CL 102 104  CO2 27 26  GLUCOSE 179* 96  BUN 6 5*  CREATININE 0.42* 0.48  CALCIUM 8.1* 7.8*   PT/INR No results for input(s): LABPROT, INR in the last 72 hours. Comprehensive Metabolic Panel:    Component Value Date/Time   NA 137 12/15/2016 0507   NA 135 12/14/2016 0427   K 3.9 12/15/2016 0507   K 4.3 12/14/2016 0427   CL 104 12/15/2016 0507   CL 102 12/14/2016 0427   CO2 26 12/15/2016 0507   CO2 27 12/14/2016 0427   BUN 5 (L) 12/15/2016 0507   BUN 6 12/14/2016  0427   CREATININE 0.48 12/15/2016 0507   CREATININE 0.42 (L) 12/14/2016 0427   GLUCOSE 96 12/15/2016 0507   GLUCOSE 179 (H) 12/14/2016 0427   CALCIUM 7.8 (L) 12/15/2016 0507   CALCIUM 8.1 (L) 12/14/2016 0427   AST 20 11/30/2016 1613   AST 26 11/15/2016 0852   ALT 18 11/30/2016 1613   ALT 16 11/15/2016 0852   ALKPHOS 84 11/30/2016 1613   ALKPHOS 56 11/15/2016 0852   BILITOT 0.8 11/30/2016 1613   BILITOT 0.7 11/15/2016 0852   PROT 7.1 11/30/2016 1613   PROT 6.2 (L) 11/15/2016 0852   ALBUMIN 3.5 11/30/2016 1613   ALBUMIN 3.5 11/15/2016 0852     Studies/Results: No results found.  Anti-infectives: Anti-infectives (From admission, onward)   Start     Dose/Rate Route Frequency Ordered Stop   12/14/16 0000  ciprofloxacin (CIPRO) IVPB 400 mg     400 mg 200 mL/hr over 60 Minutes Intravenous Every 12 hours 12/13/16 1623     12/13/16 0924  ciprofloxacin (CIPRO) IVPB 400 mg     400 mg 200 mL/hr over 60 Minutes Intravenous On call to O.R. 12/13/16 0924 12/13/16 [place] 12/15/2016

## 2016-12-16 LAB — BASIC METABOLIC PANEL
Anion gap: 6 (ref 5–15)
CALCIUM: 7.9 mg/dL — AB (ref 8.9–10.3)
CHLORIDE: 106 mmol/L (ref 101–111)
CO2: 28 mmol/L (ref 22–32)
CREATININE: 0.46 mg/dL (ref 0.44–1.00)
GFR calc Af Amer: >60 mL/min (ref >60)
Glucose, Bld: 100 mg/dL — ABNORMAL HIGH (ref 65–99)
Potassium: 3.8 mmol/L (ref 3.5–5.1)
SODIUM: 140 mmol/L (ref 135–145)

## 2016-12-16 LAB — CBC
HCT: 25 % — ABNORMAL LOW (ref 36.0–46.0)
Hemoglobin: 7.2 g/dL — ABNORMAL LOW (ref 12.0–15.0)
MCH: 19.6 pg — AB (ref 26.0–34.0)
MCHC: 28.8 g/dL — AB (ref 30.0–36.0)
MCV: 67.9 fL — ABNORMAL LOW (ref 78.0–100.0)
PLATELETS: 605 10*3/uL — AB (ref 150–400)
RBC: 3.68 MIL/uL — ABNORMAL LOW (ref 3.87–5.11)
RDW: 18.7 % — AB (ref 11.5–15.5)
WBC: 5.7 10*3/uL (ref 4.0–10.5)

## 2016-12-16 NOTE — Discharge Instructions (Signed)
Soft diet for several days.  OK to advance if tolerated.    No lifting for 4-6 weeks.  Ok to shower.   No antibiotics.    Call for issues.      [facility], [place] [id number]  ABDOMINAL SURGERY: POST OP INSTRUCTIONS  Always review your discharge instruction sheet given to you by the facility where your surgery was performed.  IF YOU HAVE DISABILITY OR FAMILY LEAVE FORMS, YOU MUST BRING THEM TO THE OFFICE FOR PROCESSING.  PLEASE DO NOT GIVE THEM TO YOUR DOCTOR.  1. A prescription for pain medication may be given to you upon discharge.  Take your pain medication as prescribed, if needed.  If narcotic pain medicine is not needed, then you may take acetaminophen (Tylenol) or ibuprofen (Advil) as needed. 2. Take your usually prescribed medications unless otherwise directed. 3. If you need a refill on your pain medication, please contact your pharmacy. They will contact our office to request authorization.  Prescriptions will not be filled after 5pm or on week-ends. 4. You should follow a light diet the first few days after arrival home, such as soup and crackers, pudding, etc.unless your doctor has advised otherwise. A high-fiber, low fat diet can be resumed as tolerated.   Be sure to include lots of fluids daily. Most patients will experience some swelling and bruising on the chest and neck area.  Ice packs will help.  Swelling and bruising can take several days to resolve 5. Most patients will experience some swelling and bruising in the area of the incision. Ice pack will help. Swelling and bruising can take several days to resolve..  6. It is common to experience some constipation if taking pain medication after surgery.  Increasing fluid intake and taking a stool softener will usually help or prevent this problem from occurring.  A mild laxative (Milk of Magnesia or Miralax) should be taken according to package directions if there are no bowel movements after 48  hours. 7.  You may have steri-strips (small skin tapes) in place directly over the incision.  These strips should be left on the skin for 10-14 days.  If your surgeon used skin glue on the incision, you may shower in 48 hours.  The glue will flake off over the next 2-3 weeks.  Any sutures or staples will be removed at the office during your follow-up visit. You may find that a light gauze bandage over your incision may keep your staples from being rubbed or pulled. You may shower and replace the bandage daily. 8. ACTIVITIES:  You may resume regular (light) daily activities beginning the next day--such as daily self-care, walking, climbing stairs--gradually increasing activities as tolerated.  You may have sexual intercourse when it is comfortable.  Refrain from any heavy lifting or straining until approved by your doctor. a. You may drive when you no longer are taking prescription pain medication, you can comfortably wear a seatbelt, and you can safely maneuver your car and apply brakes b. Return to Work: __________to be determined._______________________ 9. You should see your doctor in the office for a follow-up appointment approximately two weeks after your surgery.  Make sure that you call for this appointment within a day or two after you arrive home to insure a convenient appointment time. OTHER INSTRUCTIONS:  _____________________________________________________________ _____________________________________________________________  WHEN TO CALL YOUR DOCTOR: 1. Fever over 101.0 2. Inability to urinate 3. Nausea and/or vomiting 4. Extreme swelling or bruising 5. Continued bleeding  from incision. 6. Increased pain, redness, or drainage from the incision. 7. Difficulty swallowing or breathing 8. Muscle cramping or spasms. 9. Numbness or tingling in hands or feet or around lips.  The clinic staff is available to answer your questions during regular business hours.  Please dont hesitate to  call and ask to speak to one of the nurses if you have concerns.  For further questions, please visit [email or web address]

## 2016-12-16 NOTE — Progress Notes (Signed)
Pt able to have BM per Dr. Marlowe Aschoff requirement prior to dc. Assessment unchanged. Pt verbalized understanding of dc instructions through teach back including when to f/u with surgeon, when to call w/problems, diet and meds to resume. No scripts at dc. Dressings removed. Staples intact to well approximated midline incision as well as to puncture sites x 3. Bathing instructions given. Showers only til follow up. Discharged via foot per request accompanied by nurse and daughter to front entrance.

## 2016-12-16 NOTE — Discharge Summary (Signed)
Physician Discharge Summary  Patient ID: Brittney [name] MRN: [id number] DOB/AGE: 09-Dec-1965 51 y.o.  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  She had been having bowel movements, passing gas, and keeping liquids down, but she remained very bloated and was having significant abdominal pain.  Upon laparoscopy, she was found to have inflammatory rind along the peritoneum in the pelvis and LUQ with cloudy peritoneal fluid.  This was cultured and was negative.  The rind was pulled off.  The previous anastamosis was edematous and there was a transition point there, so it was resected.  The patient was admitted to the floor after this.  She did well and was ambulatory.  She required minimal narcotic pain medication.  She did not have nausea or vomiting and was able to pass significant amount of gas.  She was able to advance to a soft diet and was discharged to home in improved condition following a bowel movement.  She took tramadol, tylenol, and ibuprofen for pain.    Consults: None  Significant Diagnostic Studies: labs: see epic.  Stable ABL anemia and chronic anemia.  WBCs normal. Peritoneal culture normal.  Path from anastamosis showed erosive ulcer.    Treatments: surgery: see above  Discharge Exam: Blood pressure 106/60, pulse 84, temperature 99 F (37.2 C), temperature source Oral, resp. rate 18, height 5\' 5"  (1.651 m), weight 56 kg (123 lb 8 oz), last menstrual period 01/31/2014, SpO2  98 %. General appearance: alert, cooperative, no distress and pale Resp: breathing comfortably Cardio: regular rate and rhythm GI: soft, minimally distended.   Extremities: extremities normal, atraumatic, no cyanosis or edema  Disposition: 01-Home or Self Care  Discharge Instructions    Call MD for:  persistant nausea and vomiting   Complete by:  As directed    Call MD for:  redness, tenderness, or signs of infection (pain, swelling, redness, odor or green/yellow discharge around incision site)   Complete by:  As directed    Call MD for:  severe uncontrolled pain   Complete by:  As directed    Call MD for:  temperature >100.4   Complete by:  As directed    Change dressing (specify)   Complete by:  As directed    OK to d/c dressings and shower.  Redress with gauze if needed.   Diet - low sodium heart healthy   Complete by:  As directed    Discharge wound care:   Complete by:  As directed    None.   Increase activity slowly   Complete by:  As directed      Allergies as of 12/16/2016      Reactions   Other Anaphylaxis, Hives   Tree nuts   Peanut-containing Drug Products Anaphylaxis   Metronidazole    Constipation, cramping, pain   Tramadol Nausea Only   Trilyte [peg [id number] Bicarb-nacl] Other (See Comments)   Throwing up   Penicillins Other (See Comments)   "childhood allergy"  Has patient had a PCN reaction causing immediate rash, facial/tongue/throat swelling, SOB or lightheadedness with hypotension: unkn Has patient had a PCN reaction that required hospitalization: unkn  Has patient had a PCN reaction occurring within the last 10 years: unkn If all of the above answers are "NO", then may proceed with Cephalosporin use.   Percocet [oxycodone-acetaminophen] Nausea And Vomiting      Medication List    STOP taking these medications   metroNIDAZOLE 500 MG tablet Commonly known as:  FLAGYL     TAKE these medications   ferrous sulfate 325 (65 FE) MG tablet Take  325 mg by mouth 2 (two) times daily.   ibuprofen 200 MG tablet Commonly known as:  ADVIL,MOTRIN Take 400 mg by mouth every 6 (six) hours as needed for moderate pain.   IRON PO Take 75 mg 2 (two) times daily by mouth.   JUICE PLUS FIBRE PO Take 2 each by mouth daily. Orchard Blend   JUICE PLUS FIBRE PO Take 2 capsules by mouth daily. Garden Blend   JUICE PLUS FIBRE PO Take 2 each by mouth daily. Vineyard Blend   LORazepam 0.5 MG tablet Commonly known as:  ATIVAN Take 1 tablet (0.5 mg total) by mouth at bedtime as needed for anxiety or sleep. What changed:  when to take this   traMADol 50 MG tablet Commonly known as:  ULTRAM Take 1 tablet (50 mg total) every 6 (six) hours as needed by mouth (mild pain).            Discharge Care Instructions  (From admission, onward)        Start     Ordered   12/16/16 0000  Change dressing (specify)    Comments:  OK to d/c dressings and shower.  Redress with gauze if needed.   12/16/16 1129   12/16/16 0000  Discharge wound care:    Comments:  None.   12/16/16 1129     Follow-up Information    [name], MD Follow up in 2 week(s).   Specialty:  General Surgery Contact information: [address] [place] [address] [phone]           Signed: [name] 12/16/2016, 11:30 AM

## 2016-12-16 NOTE — Progress Notes (Signed)
Assessment Partial small bowel obstruction s/p laparoscopic assisted resection of prior small bowel anastomosis 12/13/16 (Dr. 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    Plan:  Advance to soft diet.     LOS: 3 days     3 Days Post-Op  Chief Complaint/Subjective: Had significant flatus.  Still has not had a BM  Objective: Vital signs in last 24 hours: Temp:  [98.4 F (36.9 C)-99 F (37.2 C)] 99 F (37.2 C) (12/10 0453) Pulse Rate:  [84-91] 84 (12/10 0453) Resp:  [16-18] 18 (12/10 0453) BP: (98-106)/(60-71) 106/60 (12/10 0453) SpO2:  [98 %-100 %] 98 % (12/10 0453) Last BM Date: 12/13/16  Intake/Output from previous day: 12/09 0701 - 12/10 0700 In: 3125 [P.O.:960; I.V.:1365; IV Piggyback:800] Out: 1600 [Urine:1600] Intake/Output this shift: No intake/output data recorded.  PE: General- In NAD.  Awake and alert. Abdomen- soft, much less distended than pre op second time.  Dressings c/d/i.    Lab Results:  Recent Labs    12/15/16 0507 12/16/16 0433  WBC 7.5 5.7  HGB 6.9* 7.2*  HCT 23.6* 25.0*  PLT 641* 605*   BMET Recent Labs    12/15/16 0507 12/16/16 0433  NA 137 140  K 3.9 3.8  CL 104 106  CO2 26 28  GLUCOSE 96 100*  BUN 5* <5*  CREATININE 0.48 0.46  CALCIUM 7.8* 7.9*   PT/INR No results for input(s): LABPROT, INR in the last 72 hours. Comprehensive Metabolic Panel:    Component Value Date/Time   NA 140 12/16/2016 0433   NA 137 12/15/2016 0507   K 3.8 12/16/2016 0433   K 3.9 12/15/2016 0507   CL 106 12/16/2016 0433   CL 104 12/15/2016 0507   CO2 28 12/16/2016 0433   CO2 26 12/15/2016 0507   BUN <5 (L) 12/16/2016 0433   BUN 5 (L) 12/15/2016 0507   CREATININE 0.46 12/16/2016 0433   CREATININE 0.48 12/15/2016 0507   GLUCOSE 100 (H) 12/16/2016 0433   GLUCOSE 96 12/15/2016 0507   CALCIUM 7.9 (L)  12/16/2016 0433   CALCIUM 7.8 (L) 12/15/2016 0507   AST 20 11/30/2016 1613   AST 26 11/15/2016 0852   ALT 18 11/30/2016 1613   ALT 16 11/15/2016 0852   ALKPHOS 84 11/30/2016 1613   ALKPHOS 56 11/15/2016 0852   BILITOT 0.8 11/30/2016 1613   BILITOT 0.7 11/15/2016 0852   PROT 7.1 11/30/2016 1613   PROT 6.2 (L) 11/15/2016 0852   ALBUMIN 3.5 11/30/2016 1613   ALBUMIN 3.5 11/15/2016 0852     Studies/Results: No results found.  Anti-infectives: Anti-infectives (From admission, onward)   Start     Dose/Rate Route Frequency Ordered Stop   12/14/16 0000  ciprofloxacin (CIPRO) IVPB 400 mg     400 mg 200 mL/hr over 60 Minutes Intravenous Every 12 hours 12/13/16 1623     12/13/16 0924  ciprofloxacin (CIPRO) IVPB 400 mg     400 mg 200 mL/hr over 60 Minutes Intravenous On call to O.R. 12/13/16 0924 12/13/16 1230       Brittney [name] 12/16/2016

## 2016-12-17 ENCOUNTER — Telehealth: Payer: Self-pay

## 2016-12-17 ENCOUNTER — Encounter (HOSPITAL_COMMUNITY): Payer: Self-pay | Admitting: Diagnostic Radiology

## 2016-12-17 ENCOUNTER — Ambulatory Visit: Payer: BLUE CROSS/BLUE SHIELD | Admitting: Oncology

## 2016-12-17 LAB — BODY FLUID CULTURE: Culture: NO GROWTH

## 2016-12-17 NOTE — Telephone Encounter (Signed)
Pt had surgery on Friday. She is having some blood in her stool. Not a lot but it is bright red. She thought she was calling surgeons's office.  She will call them now.

## 2016-12-19 ENCOUNTER — Other Ambulatory Visit: Payer: Self-pay | Admitting: Oncology

## 2016-12-20 ENCOUNTER — Ambulatory Visit (HOSPITAL_BASED_OUTPATIENT_CLINIC_OR_DEPARTMENT_OTHER): Payer: BLUE CROSS/BLUE SHIELD | Admitting: Oncology

## 2016-12-20 ENCOUNTER — Telehealth: Payer: Self-pay | Admitting: Oncology

## 2016-12-20 VITALS — BP 102/56 | HR 93 | Temp 98.2°F | Resp 18 | Ht 65.0 in | Wt 120.5 lb

## 2016-12-20 DIAGNOSIS — D509 Iron deficiency anemia, unspecified: Secondary | ICD-10-CM | POA: Diagnosis not present

## 2016-12-20 DIAGNOSIS — C7A019 Malignant carcinoid tumor of the small intestine, unspecified portion: Secondary | ICD-10-CM

## 2016-12-20 DIAGNOSIS — C7A012 Malignant carcinoid tumor of the ileum: Secondary | ICD-10-CM | POA: Diagnosis not present

## 2016-12-20 DIAGNOSIS — C7B02 Secondary carcinoid tumors of liver: Secondary | ICD-10-CM

## 2016-12-20 DIAGNOSIS — E340 Carcinoid syndrome: Secondary | ICD-10-CM | POA: Diagnosis not present

## 2016-12-20 MED ORDER — OCTREOTIDE ACETATE 20 MG IM KIT
20.0000 mg | PACK | Freq: Once | INTRAMUSCULAR | Status: AC
  Administered 2016-12-20: 20 mg via INTRAMUSCULAR

## 2016-12-20 MED ORDER — OCTREOTIDE ACETATE 20 MG IM KIT
PACK | INTRAMUSCULAR | Status: AC
  Filled 2016-12-20: qty 1

## 2016-12-20 NOTE — Patient Instructions (Signed)

## 2016-12-20 NOTE — Telephone Encounter (Signed)
Gave avs and calendar for January 2019 °

## 2016-12-20 NOTE — Progress Notes (Signed)
[facility] OFFICE PROGRESS NOTE   Diagnosis: Metastatic carcinoid tumor  INTERVAL HISTORY:   Brittney [name] developed recurrent obstructive symptoms after discharge from the hospital 12/06/2016.  She was readmitted 12/13/2016 and taken to the operating room for an exploratory laparoscopy and resection of the prior ileal anastomosis.  She was discharged 12/16/2016.  The pathology returned negative for malignancy.  She denies recurrent obstructive symptoms.  She is tolerating a diet.  Objective:  Vital signs in last 24 hours:  Blood pressure (!) 102/56, pulse 93, temperature 98.2 F (36.8 C), temperature source Oral, resp. rate 18, height 5\' 5"  (1.651 m), weight 120 lb 8 oz (54.7 kg), last menstrual period 01/31/2014, SpO2 100 %.    Resp: Lungs clear bilaterally Cardio: Regular rate and rhythm GI: No hepatosplenomegaly, soft, midline incision with staples in place Vascular: No leg edema   Lab Results:  Lab Results  Component Value Date   WBC 5.7 12/16/2016   HGB 7.2 (L) 12/16/2016   HCT 25.0 (L) 12/16/2016   MCV 67.9 (L) 12/16/2016   PLT 605 (H) 12/16/2016   NEUTROABS 6.4 11/30/2016     Medications: I have reviewed the patient's current medications.  Assessment/Plan: 1. Metastatic neuroendocrine tumor ? MRI of the abdomen 10/02/2016 confirmed multiple enhancing liver lesions consistent with metastases, no primary tumor site identified ? Ultrasound-guided biopsy of a left liver lesion 10/07/2016-neuroendocrine neoplasm, "intermediate grade ", review of pathology at GI tumor conference consistent with a low-grade carcinoid tumor ? Elevated chromogranin A 10/15/2016 ? gallium-DOTATATEscan 10/23/2016-multiple foci of liver metastases, enlarged central mesenteric lymph nodes, short intussusception's in adjacent small bowel felt to represent a primary small bowel tumor, additional mesenteric lymph nodes with metabolic activity, deep right pelvic nodule consistent  with a pelvic metastasis ? Small bowel resection 11/21/2016- mid ileum mass, low-grade neuroendocrine tumor,pT4,pN2, 6/18 lymph nodes positive, positive mesenteric resection margin (lymph node), intramural satellite nodule ? CT 11/30/2016-small bowel obstruction with transition point adjacent to suture line at the distal small bowel, liver metastases similar to the 10/02/2016 MRI ? Recurrent obstructive symptoms requiring repeat surgery with resection of the small bowel anastomosis site 12/13/2016, pathology negative for malignancy ? Initiation of monthly Sandostatin 12/20/2016  2. 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 Intermittent abdominal bloating and left-sided abdominal pain  4.  Admission 11/30/2016 with a small bowel obstruction    Disposition: Brittney [name] has recovered from the repeat small bowel surgery.  She has metastatic carcinoid tumor.  I discussed the case with Dr. Barry Dienes.  The plan is to begin monthly Sandostatin today.  We reviewed potential toxicities associated with Sandostatin and she agrees to proceed.  Brittney [name] is interested in proceeding with Lutathera therapy, but her insurance company will deny payment unless progression on Sandostatin is demonstrated.  We will initiate an appeal process.  The plan is to obtain a CT in 3-4 months to look for evidence of disease progression if her insurance company will not approve the [place] now.  She will return for an office visit and Sandostatin in 1 month.  We discussed the prognosis and expectations from treatment with Sandostatin and Lutathera.  I explained there appears to be a progression free survival benefit associated with Lutathera, but a clear survival benefit has not been is demonstrated to date.  She will resume iron therapy.  We will check a CBC when she returns next month.  30 minutes were spent with the patient today.  The majority of the  time was used for counseling and coordination of care.  [name], MD  12/20/2016  5:45 PM

## 2016-12-20 NOTE — Progress Notes (Signed)
  Oncology Nurse Navigator Documentation  Navigator Location: [facility] (12/20/16 8185)   )Navigator Encounter Type: Follow-up Appt (12/20/16 6314)                             Interventions: Education;Psycho-social support (12/20/16 9702)  Spoke with patient during her follow-up visit with Dr. Benay Spice. Patient voiced discouragement that her Ephriam Knuckles was not approved by her insurance company. Patient verbalized that it is hard to wait and that she is looking forward to feeling better. I reviewed foods that patient can eat as she progresses her diet after her latest surgery that are nutritious and calorie laden. Patient encouraged to call me with concerns or questions.   Education Method: Verbal (12/20/16 6378)      Acuity: Level 2 (12/20/16 5885)   Acuity Level 2: Educational needs;Other(Emotional Support) (12/20/16 0277)     Time Spent with Patient: 15 (12/20/16 4128)

## 2017-01-17 ENCOUNTER — Inpatient Hospital Stay: Payer: BLUE CROSS/BLUE SHIELD | Attending: Oncology | Admitting: Oncology

## 2017-01-17 ENCOUNTER — Inpatient Hospital Stay: Payer: BLUE CROSS/BLUE SHIELD

## 2017-01-17 ENCOUNTER — Telehealth: Payer: Self-pay | Admitting: Oncology

## 2017-01-17 ENCOUNTER — Encounter: Payer: Self-pay | Admitting: Oncology

## 2017-01-17 VITALS — BP 100/62 | HR 62 | Temp 97.0°F | Resp 18 | Wt 118.7 lb

## 2017-01-17 DIAGNOSIS — C7A8 Other malignant neuroendocrine tumors: Secondary | ICD-10-CM | POA: Diagnosis not present

## 2017-01-17 DIAGNOSIS — Z79899 Other long term (current) drug therapy: Secondary | ICD-10-CM | POA: Insufficient documentation

## 2017-01-17 DIAGNOSIS — C787 Secondary malignant neoplasm of liver and intrahepatic bile duct: Secondary | ICD-10-CM | POA: Insufficient documentation

## 2017-01-17 DIAGNOSIS — D500 Iron deficiency anemia secondary to blood loss (chronic): Secondary | ICD-10-CM | POA: Diagnosis not present

## 2017-01-17 DIAGNOSIS — C7A019 Malignant carcinoid tumor of the small intestine, unspecified portion: Secondary | ICD-10-CM

## 2017-01-17 DIAGNOSIS — K56609 Unspecified intestinal obstruction, unspecified as to partial versus complete obstruction: Secondary | ICD-10-CM

## 2017-01-17 LAB — CBC WITH DIFFERENTIAL/PLATELET
BASOS ABS: 0.1 10*3/uL (ref 0.0–0.1)
BASOS PCT: 1 %
Eosinophils Absolute: 0.2 10*3/uL (ref 0.0–0.5)
Eosinophils Relative: 5 %
HEMATOCRIT: 34.9 % (ref 34.8–46.6)
HEMOGLOBIN: 10.3 g/dL — AB (ref 11.6–15.9)
Lymphocytes Relative: 37 %
Lymphs Abs: 1.8 10*3/uL (ref 0.9–3.3)
MCH: 20.8 pg — ABNORMAL LOW (ref 25.1–34.0)
MCHC: 29.6 g/dL — ABNORMAL LOW (ref 31.5–36.0)
MCV: 70.4 fL — ABNORMAL LOW (ref 79.5–101.0)
Monocytes Absolute: 0.4 10*3/uL (ref 0.1–0.9)
Monocytes Relative: 9 %
NEUTROS ABS: 2.3 10*3/uL (ref 1.5–6.5)
NEUTROS PCT: 48 %
Platelets: 239 10*3/uL (ref 145–400)
RBC: 4.96 MIL/uL (ref 3.70–5.45)
RDW: 22.2 % — ABNORMAL HIGH (ref 11.2–16.1)
WBC: 4.8 10*3/uL (ref 3.9–10.3)

## 2017-01-17 MED ORDER — OCTREOTIDE ACETATE 20 MG IM KIT
20.0000 mg | PACK | Freq: Once | INTRAMUSCULAR | Status: AC
  Administered 2017-01-17: 20 mg via INTRAMUSCULAR

## 2017-01-17 MED ORDER — OCTREOTIDE ACETATE 20 MG IM KIT
PACK | INTRAMUSCULAR | Status: AC
  Filled 2017-01-17: qty 1

## 2017-01-17 NOTE — Progress Notes (Signed)
  [facility] OFFICE PROGRESS NOTE   Diagnosis: Carcinoid tumor  INTERVAL HISTORY:   Ms. Plouff returns as scheduled.  She started [facility] 12/20/2016.  No diarrhea or flushing.  She has occasional left upper abdominal pain, no consistent pain.  No nausea or vomiting.  Her bowels are moving.  Her bowel habits remain irregular.  She is taking iron.  Ms. Moline has returned to exercising.  She reports eating 3 meals a day and snacks.  She is using protein supplements.  Objective:  Vital signs in last 24 hours:  Blood pressure 100/62, pulse 62, temperature (!) 97 F (36.1 C), temperature source Oral, resp. rate 18, weight 118 lb 11.2 oz (53.8 kg), last menstrual period 01/31/2014, SpO2 100 %.    Resp: Lungs clear bilaterally Cardio: Regular rate and rhythm GI: No hepatomegaly, no mass, nontender, nondistended Vascular: No leg edema    Lab Results:  Lab Results  Component Value Date   WBC 4.8 01/17/2017   HGB 10.3 (L) 01/17/2017   HCT 34.9 01/17/2017   MCV 70.4 (L) 01/17/2017   PLT 239 01/17/2017   NEUTROABS 2.3 01/17/2017    Medications: I have reviewed the patient's current medications.   Assessment/Plan: 1. Metastatic neuroendocrine tumor ? MRI of the abdomen 10/02/2016 confirmed multiple enhancing liver lesions consistent with metastases, no primary tumor site identified ? Ultrasound-guided biopsy of a left liver lesion 10/07/2016-neuroendocrine neoplasm, "intermediate grade ", review of pathology at GI tumor conference consistent with a low-grade carcinoid tumor ? Elevated chromogranin A 10/15/2016 ? gallium-DOTATATEscan 10/23/2016-multiple foci of liver metastases, enlarged central mesenteric lymph nodes, short intussusception's in adjacent small bowel felt to represent a primary small bowel tumor, additional mesenteric lymph nodes with metabolic activity, deep right pelvic nodule consistent with a pelvic metastasis ? Small bowel resection  11/21/2016- mid ileum mass, low-grade neuroendocrine tumor,pT4,pN2, 6/18 lymph nodes positive, positive mesenteric resection margin (lymph node), intramural satellite nodule ? CT 11/30/2016-small bowel obstruction with transition point adjacent to suture line at the distal small bowel, liver metastases similar to the 10/02/2016 MRI ? Recurrent obstructive symptoms requiring repeat surgery with resection of the small bowel anastomosis site 12/13/2016, pathology negative for malignancy ? Initiation of monthly Sandostatin 12/20/2016  2. 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 Intermittent abdominal bloating and left-sided abdominal pain  4.Admission 11/30/2016 with a small bowel obstruction   Disposition: Ms. Tillis appears well today.  The plan is to continue monthly Sandostatin.  She will be scheduled for a restaging abdomen CT after approximately 4 months of Sandostatin therapy.  We will refer her for Lutathera if a CT documents disease progression.  She will complete another treatment with Sandostatin today.  The iron deficiency anemia has partially improved.  She will decrease the ferrous sulfate to once daily.  Ms. Mossa will return for an office visit and Sandostatin in 1 month.  15 minutes were spent with the patient today.  The majority of the time was used for counseling and coordination of care.  [name], MD  01/17/2017  9:16 AM

## 2017-01-17 NOTE — Telephone Encounter (Signed)
Gave avs and calendar for february °

## 2017-01-22 ENCOUNTER — Other Ambulatory Visit: Payer: Self-pay | Admitting: General Surgery

## 2017-01-22 ENCOUNTER — Telehealth: Payer: Self-pay | Admitting: *Deleted

## 2017-01-22 ENCOUNTER — Ambulatory Visit
Admission: RE | Admit: 2017-01-22 | Discharge: 2017-01-22 | Disposition: A | Discharge status: Home / Self Care | Payer: BLUE CROSS/BLUE SHIELD | Source: Ambulatory Visit | Attending: General Surgery | Admitting: General Surgery

## 2017-01-22 DIAGNOSIS — Z8719 Personal history of other diseases of the digestive system: Secondary | ICD-10-CM

## 2017-01-22 NOTE — Telephone Encounter (Signed)
Call from pt reporting worsening abdominal discomfort/ digestive issues/diarrhea. Reviewed with Dr. Benay Spice. MD recommends pt see surgeon. Left message on voicemail informing pt.

## 2017-02-06 ENCOUNTER — Other Ambulatory Visit: Payer: Self-pay | Admitting: Emergency Medicine

## 2017-02-06 ENCOUNTER — Other Ambulatory Visit: Payer: Self-pay | Admitting: General Surgery

## 2017-02-06 DIAGNOSIS — C7A8 Other malignant neuroendocrine tumors: Secondary | ICD-10-CM

## 2017-02-06 MED ORDER — LORAZEPAM 0.5 MG PO TABS: 0.5000 mg | ORAL_TABLET | Freq: Every evening | ORAL | 2 refills | Status: DC | PRN

## 2017-02-12 DIAGNOSIS — C7A019 Malignant carcinoid tumor of the small intestine, unspecified portion: Secondary | ICD-10-CM

## 2017-02-12 NOTE — Progress Notes (Signed)
  Oncology Nurse Navigator Documentation  Navigator Location: [facility] (02/12/17 0932)   )Navigator Encounter Type: Telephone (02/12/17 0932) Telephone: Incoming Call;Outgoing Call;Appt Confirmation/Clarification (02/12/17 0932)                           Interventions: Referrals (02/12/17 0932) Referrals: Nutrition/dietician (02/12/17 0932)     Patient called asking for a referral to a dietician for digestive issues. Patient states that she has has issues with both constipation and diarrhea. Referral made to [facility] and appointment schedule for 02/13/17 @ 3:15 PM.     Acuity: Level 1 (02/12/17 0932)         Time Spent with Patient: 15 (02/12/17 0932)

## 2017-02-13 ENCOUNTER — Inpatient Hospital Stay: Payer: BLUE CROSS/BLUE SHIELD

## 2017-02-13 ENCOUNTER — Inpatient Hospital Stay: Payer: BLUE CROSS/BLUE SHIELD | Attending: Oncology | Admitting: Oncology

## 2017-02-13 ENCOUNTER — Telehealth: Payer: Self-pay | Admitting: Oncology

## 2017-02-13 ENCOUNTER — Inpatient Hospital Stay: Payer: BLUE CROSS/BLUE SHIELD | Admitting: Nutrition

## 2017-02-13 VITALS — BP 102/57 | HR 64 | Temp 98.1°F | Resp 18 | Ht 65.0 in | Wt 119.5 lb

## 2017-02-13 DIAGNOSIS — D500 Iron deficiency anemia secondary to blood loss (chronic): Secondary | ICD-10-CM | POA: Diagnosis not present

## 2017-02-13 DIAGNOSIS — C7A019 Malignant carcinoid tumor of the small intestine, unspecified portion: Secondary | ICD-10-CM

## 2017-02-13 DIAGNOSIS — C7A8 Other malignant neuroendocrine tumors: Secondary | ICD-10-CM | POA: Diagnosis not present

## 2017-02-13 DIAGNOSIS — C787 Secondary malignant neoplasm of liver and intrahepatic bile duct: Secondary | ICD-10-CM | POA: Diagnosis not present

## 2017-02-13 DIAGNOSIS — Z79899 Other long term (current) drug therapy: Secondary | ICD-10-CM | POA: Diagnosis not present

## 2017-02-13 DIAGNOSIS — R109 Unspecified abdominal pain: Secondary | ICD-10-CM | POA: Insufficient documentation

## 2017-02-13 LAB — CBC WITH DIFFERENTIAL (CANCER CENTER ONLY)
Basophils Absolute: 0.1 10*3/uL (ref 0.0–0.1)
Basophils Relative: 2 %
Eosinophils Absolute: 0.1 10*3/uL (ref 0.0–0.5)
Eosinophils Relative: 4 %
HEMATOCRIT: 35 % (ref 34.8–46.6)
HEMOGLOBIN: 10.8 g/dL — AB (ref 11.6–15.9)
LYMPHS ABS: 1.5 10*3/uL (ref 0.9–3.3)
Lymphocytes Relative: 42 %
MCH: 22.2 pg — AB (ref 25.1–34.0)
MCHC: 30.8 g/dL — ABNORMAL LOW (ref 31.5–36.0)
MCV: 72.1 fL — AB (ref 79.5–101.0)
MONOS PCT: 11 %
Monocytes Absolute: 0.4 10*3/uL (ref 0.1–0.9)
NEUTROS ABS: 1.4 10*3/uL — AB (ref 1.5–6.5)
NEUTROS PCT: 41 %
Platelet Count: 237 10*3/uL (ref 145–400)
RBC: 4.85 MIL/uL (ref 3.70–5.45)
RDW: 20.7 % — ABNORMAL HIGH (ref 11.2–14.5)
WBC Count: 3.5 10*3/uL — ABNORMAL LOW (ref 3.9–10.3)

## 2017-02-13 MED ORDER — OCTREOTIDE ACETATE 20 MG IM KIT
20.0000 mg | PACK | Freq: Once | INTRAMUSCULAR | Status: AC
  Administered 2017-02-13: 20 mg via INTRAMUSCULAR

## 2017-02-13 MED ORDER — OCTREOTIDE ACETATE 20 MG IM KIT
PACK | INTRAMUSCULAR | Status: AC
  Filled 2017-02-13: qty 1

## 2017-02-13 NOTE — Telephone Encounter (Signed)
Gave avs and calendar for march per md ok for lab after

## 2017-02-13 NOTE — Progress Notes (Signed)
[name] OFFICE PROGRESS NOTE   Diagnosis: Carcinoid tumor  INTERVAL HISTORY:   Brittney [name] returns as scheduled.  She was last treated with Sandostatin 01/17/2017.  She continues to have irregular bowel habits.  She is taking MiraLAX.  She has frequent bowel movements some days.  No significant bloating.  She is exercising. A plain x-ray 01/22/2017 revealed stool in the right colon, no obstruction.  She is scheduled for a CT of the abdomen 02/19/2017  Objective:  Vital signs in last 24 hours:  Blood pressure (!) 102/57, pulse 64, temperature 98.1 F (36.7 C), temperature source Oral, resp. rate 18, height 5\' 5"  (1.651 m), weight 119 lb 8 oz (54.2 kg), last menstrual period 01/31/2014, SpO2 100 %.   Resp: Lungs clear bilaterally Cardio: Regular rate and rhythm GI: No hepatomegaly, nontender, nondistended Vascular: No leg edema   Lab Results:  Lab Results  Component Value Date   WBC 3.5 (L) 02/13/2017   HGB 10.3 (L) 01/17/2017   HCT 35.0 02/13/2017   MCV 72.1 (L) 02/13/2017   PLT 237 02/13/2017   NEUTROABS 1.4 (L) 02/13/2017    CMP     Component Value Date/Time   NA 140 12/16/2016 0433   K 3.8 12/16/2016 0433   CL 106 12/16/2016 0433   CO2 28 12/16/2016 0433   GLUCOSE 100 (H) 12/16/2016 0433   BUN <5 (L) 12/16/2016 0433   CREATININE 0.46 12/16/2016 0433   CALCIUM 7.9 (L) 12/16/2016 0433   PROT 7.1 11/30/2016 1613   ALBUMIN 3.5 11/30/2016 1613   AST 20 11/30/2016 1613   ALT 18 11/30/2016 1613   ALKPHOS 84 11/30/2016 1613   BILITOT 0.8 11/30/2016 1613   GFRNONAA >60 12/16/2016 0433   GFRAA >60 12/16/2016 0433     Medications: I have reviewed the patient's current medications.   Assessment/Plan: 1. Metastatic neuroendocrine tumor ? MRI of the abdomen 10/02/2016 confirmed multiple enhancing liver lesions consistent with metastases, no primary tumor site identified ? Ultrasound-guided biopsy of a left liver lesion 10/07/2016-neuroendocrine  neoplasm, "intermediate grade ", review of pathology at GI tumor conference consistent with a low-grade carcinoid tumor ? Elevated chromogranin A 10/15/2016 ? gallium-DOTATATEscan 10/23/2016-multiple foci of liver metastases, enlarged central mesenteric lymph nodes, short intussusception's in adjacent small bowel felt to represent a primary small bowel tumor, additional mesenteric lymph nodes with metabolic activity, deep right pelvic nodule consistent with a pelvic metastasis ? Small bowel resection 11/21/2016-mid ileum mass, low-grade neuroendocrine tumor,pT4,pN2, 6/18 lymph nodes positive, positive mesenteric resection margin (lymph node), intramural satellite nodule ? CT 11/30/2016-small bowel obstruction with transition point adjacent to suture line at the distal small bowel, liver metastases similar to the 10/02/2016 MRI ? Recurrent obstructive symptoms requiring repeat surgery with resection of the small bowel anastomosis site 12/13/2016, pathology negative for malignancy ? Initiation of monthly Sandostatin 12/20/2016  2. 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 Intermittent abdominal bloating and left-sided abdominal pain-improved  4.Admission 11/30/2016 with a small bowel obstruction  Disposition: Brittney [name] appears well.  She continues to have irregular bowel habits, potentially related to recent small bowel surgeries.  I doubt the bowel irregularity is related to Sandostatin.  She will continue monthly Sandostatin. She is scheduled to undergo a restaging CT next week.  She will be referred for Lutathera if the CT reveals evidence of disease progression.  She will complete a treatment with Sandostatin today.  Brittney [name] is scheduled for an office  visit and the next Sandostatin injection 03/13/2017.  [name], MD  02/13/2017  9:22 AM

## 2017-02-13 NOTE — Patient Instructions (Signed)

## 2017-02-13 NOTE — Progress Notes (Signed)
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 She is a patient of Dr. Benay Spice. Pt is s/p two bowel resections (11/23/16 and 12/13/16). Per chart, pt is scheduled for CT of abdomen 02/19/17  Pt endorses weight loss of 10 lbs since November, this is 7.8% weight loss in 3 months, significant weight loss.   Pt presents with irregular bowel movements. Reporting up to 6 bowel movements daily. Pt reports foods that trigger bowel movements are tomato sauces, cream cheese, and greasy/fried foods.  Pt regularly exercises she goes to the gym daily as well as walks her dog.  Pt drinks 64 oz of tea and water daily. Pt is allergic to tree nuts. Pt taking probiotic pill daily (unsure of brand).   A typical day of eating is as follows: Breakfast: 1 1/2 pieces of gluten free toast with butter (pt plans to try avocado spread) or "ONE" brand granola bar  Lunch: grilled chicken salad with fruit (apple and cranberry) Dinner: Chicken stir fry with brown rice Beverages: cup of coffee, red wine, Yogi unsweetened berry tea and water  Per chart, pt's anemia profile has improved.   Nutrition Focused Physical Exam: deferred   Medications: ferrous sulfate, juice plus, Miralax  Labs: MCV 72.1, MCH 22.2, MCHC 30.8, RDW 20.7   Anthropometrics:   Height: 5'5'' Weight: 119 lbs (Down from 129 lbs 11/15/16) UBW: 128 lb BMI: 19.89 kg/m^2  Estimated Energy Needs  Kcals: 7858-8502 Protein: 80-95 grams  Fluid: 1.8 L/d   NUTRITION DIAGNOSIS: Unintentional weight loss related to cancer and associated treatments as evidenced by pt repot and 7.8% weight loss over the past 3 months  INTERVENTION:  Encouraged adequate hydration  Promoted small, frequent meals consisting of high calorie, high protein foods and beverages to promote weight maintenance. Discussed methods/recommendations to alleviate and/or aid in  bowel movements. Educated pt on balanced meals and fiber consumption. Handouts given.Questions answered. Contact information provided.   MONITORING, EVALUATION, GOAL: Patient will tolerate adequate calories and protein to minimize weight loss through treatment  NEXT VISIT: To be scheduled  Parks Ranger, MS, RDN, LDN 02/13/2017 5:16 PM

## 2017-02-19 ENCOUNTER — Ambulatory Visit
Admission: RE | Admit: 2017-02-19 | Discharge: 2017-02-19 | Disposition: A | Discharge status: Home / Self Care | Payer: BLUE CROSS/BLUE SHIELD | Source: Ambulatory Visit | Attending: General Surgery | Admitting: General Surgery

## 2017-02-19 DIAGNOSIS — C7A8 Other malignant neuroendocrine tumors: Secondary | ICD-10-CM

## 2017-02-19 MED ORDER — IOPAMIDOL (ISOVUE-300) INJECTION 61%
100.0000 mL | Freq: Once | INTRAVENOUS | Status: AC | PRN
  Administered 2017-02-19: 100 mL via INTRAVENOUS

## 2017-02-21 ENCOUNTER — Telehealth: Payer: Self-pay

## 2017-02-21 NOTE — Telephone Encounter (Addendum)
Spoke with pt regarding CT scan from [place]. Pt called requesting results. Per Dr. Benay Spice, overall liver masses are stable. Few of the masses measured slightly larger, will present at conference about whether the progression is enough for Lutathera. No evidence of bowel obstruction. Will discuss further at next visit. Pt voiced confusion, "I though he said I would be getting my Lutathera at my next visit". Per Dr. Benay Spice, the process to get Brittney [name] takes several months. Pt can be referred to Dr. Leonia Reeves for a consult. MD will have to measure and justify progression. Spoke with Berna Spare in Nuclear Medicine and she stated that "pt has already consulted with Dr. Leonia Reeves, we are just waiting for approval from insurance". No additional appts for consultation are needed. Relayed information to pt. Pt states "will you follow up with insurance once everything is determined or will I"? Will keep pt updated. Pt voiced understanding.

## 2017-02-25 NOTE — Progress Notes (Signed)
Please let mrs Daluz know that Dr. Benay Spice is reviewing it with radiology to get specific measurements to see if we can confirm progression.

## 2017-02-28 ENCOUNTER — Telehealth: Payer: Self-pay | Admitting: *Deleted

## 2017-02-28 NOTE — Telephone Encounter (Signed)
Pt called for update, asking if she will be scheduled for Lutathera treatment based on most recent CT. Per Dr. Benay Spice: scan was stable with minimal progression, will review with Dr. Leonia Reeves.

## 2017-03-05 ENCOUNTER — Other Ambulatory Visit: Payer: Self-pay

## 2017-03-13 ENCOUNTER — Inpatient Hospital Stay: Payer: BLUE CROSS/BLUE SHIELD

## 2017-03-13 ENCOUNTER — Inpatient Hospital Stay: Payer: BLUE CROSS/BLUE SHIELD | Attending: Oncology | Admitting: Oncology

## 2017-03-13 ENCOUNTER — Telehealth: Payer: Self-pay | Admitting: *Deleted

## 2017-03-13 ENCOUNTER — Telehealth: Payer: Self-pay | Admitting: Oncology

## 2017-03-13 VITALS — BP 111/71 | HR 65 | Temp 98.1°F | Resp 20 | Ht 65.0 in | Wt 122.8 lb

## 2017-03-13 DIAGNOSIS — K56609 Unspecified intestinal obstruction, unspecified as to partial versus complete obstruction: Secondary | ICD-10-CM | POA: Insufficient documentation

## 2017-03-13 DIAGNOSIS — C787 Secondary malignant neoplasm of liver and intrahepatic bile duct: Secondary | ICD-10-CM | POA: Diagnosis not present

## 2017-03-13 DIAGNOSIS — C7A019 Malignant carcinoid tumor of the small intestine, unspecified portion: Secondary | ICD-10-CM

## 2017-03-13 DIAGNOSIS — C7A8 Other malignant neuroendocrine tumors: Secondary | ICD-10-CM | POA: Diagnosis not present

## 2017-03-13 DIAGNOSIS — C801 Malignant (primary) neoplasm, unspecified: Secondary | ICD-10-CM | POA: Diagnosis not present

## 2017-03-13 DIAGNOSIS — D500 Iron deficiency anemia secondary to blood loss (chronic): Secondary | ICD-10-CM

## 2017-03-13 LAB — CBC WITH DIFFERENTIAL (CANCER CENTER ONLY)
BASOS PCT: 1 %
Basophils Absolute: 0.1 10*3/uL (ref 0.0–0.1)
EOS ABS: 0.1 10*3/uL (ref 0.0–0.5)
Eosinophils Relative: 2 %
HCT: 37.6 % (ref 34.8–46.6)
HEMOGLOBIN: 11.6 g/dL (ref 11.6–15.9)
Lymphocytes Relative: 40 %
Lymphs Abs: 2 10*3/uL (ref 0.9–3.3)
MCH: 22.7 pg — ABNORMAL LOW (ref 25.1–34.0)
MCHC: 31 g/dL — ABNORMAL LOW (ref 31.5–36.0)
MCV: 73.3 fL — ABNORMAL LOW (ref 79.5–101.0)
Monocytes Absolute: 0.4 10*3/uL (ref 0.1–0.9)
Monocytes Relative: 8 %
NEUTROS PCT: 49 %
Neutro Abs: 2.5 10*3/uL (ref 1.5–6.5)
PLATELETS: 268 10*3/uL (ref 145–400)
RBC: 5.13 MIL/uL (ref 3.70–5.45)
RDW: 18.4 % — ABNORMAL HIGH (ref 11.2–14.5)
WBC: 5.1 10*3/uL (ref 3.9–10.3)

## 2017-03-13 MED ORDER — OCTREOTIDE ACETATE 20 MG IM KIT
PACK | INTRAMUSCULAR | Status: AC
  Filled 2017-03-13: qty 1

## 2017-03-13 MED ORDER — OCTREOTIDE ACETATE 20 MG IM KIT
20.0000 mg | PACK | Freq: Once | INTRAMUSCULAR | Status: AC
  Administered 2017-03-13: 20 mg via INTRAMUSCULAR

## 2017-03-13 NOTE — Patient Instructions (Signed)

## 2017-03-13 NOTE — Progress Notes (Signed)
  [name]   Diagnosis: Metastatic carcinoid tumor  INTERVAL HISTORY:   Brittney [name] returns as scheduled.  She completed another treatment with Sandostatin on 02/13/2017.  She reports improvement in the irregular bowel habits.  She is now drinking lactose-free chocolate milk.  Good appetite and energy level.  She is exercising.  Objective:  Vital signs in last 24 hours:  Blood pressure 111/71, pulse 65, temperature 98.1 F (36.7 C), temperature source Oral, resp. rate 20, height 5\' 5"  (1.651 m), weight 122 lb 12.8 oz (55.7 kg), last menstrual period 01/31/2014, SpO2 100 %. Resp: Lungs clear bilaterally Cardio: Regular rate and rhythm GI: No hepatomegaly, nondistended, no mass Vascular: No leg edema     Lab Results:  CBC pending Medications: I have reviewed the patient's current medications.   Assessment/Plan: 1. Metastatic neuroendocrine tumor ? MRI of the abdomen 10/02/2016 confirmed multiple enhancing liver lesions consistent with metastases, no primary tumor site identified ? Ultrasound-guided biopsy of a left liver lesion 10/07/2016-neuroendocrine neoplasm, "intermediate grade ", review of pathology at GI tumor conference consistent with a low-grade carcinoid tumor ? Elevated chromogranin A 10/15/2016 ? gallium-DOTATATEscan 10/23/2016-multiple foci of liver metastases, enlarged central mesenteric lymph nodes, short intussusception's in adjacent small bowel felt to represent a primary small bowel tumor, additional mesenteric lymph nodes with metabolic activity, deep right pelvic nodule consistent with a pelvic metastasis ? Small bowel resection 11/21/2016-mid ileum mass, low-grade neuroendocrine tumor,pT4,pN2, 6/18 lymph nodes positive, positive mesenteric resection margin (lymph node), intramural satellite nodule ? CT 11/30/2016-small bowel obstruction with transition point adjacent to suture line at the distal small bowel, liver  metastases similar to the 10/02/2016 MRI ? Recurrent obstructive symptoms requiring repeat surgery with resection of the small bowel anastomosis site 12/13/2016, pathology negative for malignancy ? Initiation of monthly Sandostatin 12/20/2016 ? CT abdomen/pelvis 02/19/2017-mild enlargement of liver lesions compared to November 2018  2. 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 Intermittent abdominal bloating and left-sided abdominal pain-improved  4.Admission 11/30/2016 with a small bowel obstruction  Disposition: Brittney [name] appears well.  The restaging CT last month confirmed enlargement of liver lesions while on Sandostatin.  She appears to be a candidate for Lutathera therapy.  We made a referral to Dr. Ilda Foil.  I will call the [place] director today in an attempt to gain insurance approval for the [place].  I discussed the treatment schedule for Lutathera with Brittney [name] and her husband.  She will continue monthly Sandostatin.  She will return for an office visit in 2 months.  We will obtain laboratory studies surrounding Lutathera as recommended by Dr. Ilda Foil.  [name], MD  03/13/2017  9:00 AM

## 2017-03-13 NOTE — Telephone Encounter (Signed)
-----   Message from Ladell Pier, MD sent at 03/13/2017 11:09 AM EST ----- Please call patient, hemoglobin is better, continue iron, follow-up as scheduled

## 2017-03-13 NOTE — Telephone Encounter (Signed)
Informed pt of HGB result and instructions to continue iron, per Dr. Benay Spice. She voiced understanding. Pt reports she heard from her advocate at [facility] and the medical director should review the case today.  She understands we were told it can take up to a week.

## 2017-03-13 NOTE — Telephone Encounter (Signed)
Appointments scheduled AVS/Calendar printed per 3/7 los °

## 2017-03-18 ENCOUNTER — Telehealth: Payer: Self-pay

## 2017-03-18 NOTE — Telephone Encounter (Signed)
Returned call to patient to inform of second denial for lutathera treatment. Explained reason for denial and that we will make Dr. Benay Spice aware in order for him to perform peer-to-peer review if possible. Patient encouraged to reach out to her patient advocate through [facility]. Patient voiced understanding. Will call back for update at end of week.

## 2017-03-21 ENCOUNTER — Telehealth: Payer: Self-pay

## 2017-03-21 NOTE — Telephone Encounter (Signed)
Spoke with [employer]. At [facility] regarding pt Lutathera appeal. He informed that the next step in the appeals process is to submit a Level 1 appeal (written). He directed me to two forms and instructed to fax those completed along with supporting clinical documentation to [id number] with case ref#: [id number]. Called pt to relay information. Pt voiced understanding and printed forms to complete and bring to office.

## 2017-03-26 ENCOUNTER — Telehealth: Payer: Self-pay

## 2017-03-26 NOTE — Telephone Encounter (Signed)
Returned pt call. Lutathera appeal faxed and pt requesting injection appt to be moved to 4/9. Will consult MD, voiced understanding.

## 2017-03-27 ENCOUNTER — Telehealth: Payer: Self-pay | Admitting: Nurse Practitioner

## 2017-03-27 NOTE — Telephone Encounter (Signed)
R/s appt per 3/20 sch message - patient is aware of appt date and time.  

## 2017-04-10 ENCOUNTER — Other Ambulatory Visit: Payer: Self-pay

## 2017-04-10 ENCOUNTER — Ambulatory Visit: Payer: Self-pay

## 2017-04-14 ENCOUNTER — Other Ambulatory Visit: Payer: Self-pay

## 2017-04-15 ENCOUNTER — Other Ambulatory Visit: Payer: Self-pay

## 2017-04-15 ENCOUNTER — Inpatient Hospital Stay: Payer: BLUE CROSS/BLUE SHIELD | Attending: Oncology

## 2017-04-15 VITALS — BP 109/70 | HR 63 | Temp 97.9°F | Resp 18

## 2017-04-15 DIAGNOSIS — C7A8 Other malignant neuroendocrine tumors: Secondary | ICD-10-CM | POA: Insufficient documentation

## 2017-04-15 DIAGNOSIS — C7A019 Malignant carcinoid tumor of the small intestine, unspecified portion: Secondary | ICD-10-CM

## 2017-04-15 DIAGNOSIS — Z79899 Other long term (current) drug therapy: Secondary | ICD-10-CM | POA: Diagnosis not present

## 2017-04-15 MED ORDER — OCTREOTIDE ACETATE 20 MG IM KIT
PACK | INTRAMUSCULAR | Status: AC
  Filled 2017-04-15: qty 1

## 2017-04-15 MED ORDER — OCTREOTIDE ACETATE 20 MG IM KIT
20.0000 mg | PACK | Freq: Once | INTRAMUSCULAR | Status: AC
  Administered 2017-04-15: 20 mg via INTRAMUSCULAR

## 2017-04-16 ENCOUNTER — Telehealth: Payer: Self-pay

## 2017-04-16 NOTE — Telephone Encounter (Addendum)
Call from pt. Pt reports that she has had "struggles with my digestive tract". Pt reports constipation and diarrhea back and forth. I take Miralax daily". Pt reports "up to 8 stools/day when having diarrhea". Also reports " significant hair loss. Is this a side effect of the Sandostatin?". Per Dr. Benay Spice, pt can use colace as it is milder than Miralax, pt to also use imodium (up to 8 per day) for diarrhea. Also noted that hair loss has been reported as a side effect of sandostatin with less than 13 percent of users affected. Pt voiced understanding. Will call with any further questions/concerns.

## 2017-04-29 ENCOUNTER — Telehealth: Payer: Self-pay

## 2017-04-29 ENCOUNTER — Ambulatory Visit: Payer: BLUE CROSS/BLUE SHIELD | Admitting: Nutrition

## 2017-04-29 NOTE — Telephone Encounter (Signed)
Call from pt to report that [place] treatment was approved. This RN voiced understanding. Spoke with Berna Spare, pt will email lutathera approval to office. Also contacted Prior Authorization Specialist to request a pre-certification for 30mg  Sandostatin per [employer]. Will follow up.

## 2017-04-29 NOTE — Progress Notes (Signed)
I contacted patient by telephone. 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  Past medical history includes vitamin D deficiency, hyperlipidemia, magnesium deficiency, iron deficiency, and small bowel obstruction.  Medications include Ativan, Juice Plus, MiraLAX, and probiotics.  Labs were reviewed.  Height: 65 inches. Weight: 122.8 pounds. Usual body weight: 130 pounds. BMI: 20.43.  Patient reports she has varying degrees of diarrhea versus constipation.   She is getting monthly Sandostatin injections. She is tolerating lactose-free milk. She reports hair loss. She would like to know what to add to her diet to improve her GI function.  Nutrition diagnosis:  Food and nutrition related knowledge deficit related to metastatic carcinoid tumor as evidenced by no prior need for nutrition related information.  Intervention: Educated patient on the importance of smaller more frequent meals and snacks.  Reviewed low lactose diet but encouraged patient to add yogurt on a daily basis as tolerated. Provided information about various probiotics. Reviewed strategies for foods to improve diarrhea. Suggested patient could take biotin/B vitamins to see if this improves hair loss. Encouraged her to discuss potential vitamin deficiencies with MD. Questions were answered.  Teach back method used.  Patient has my contact information.  Monitoring, evaluation, goals: Patient will tolerate oral diet to improve GI function.  Patient will contact me for questions.

## 2017-05-12 ENCOUNTER — Other Ambulatory Visit: Payer: Self-pay | Admitting: Nurse Practitioner

## 2017-05-12 DIAGNOSIS — C7A019 Malignant carcinoid tumor of the small intestine, unspecified portion: Secondary | ICD-10-CM

## 2017-05-13 ENCOUNTER — Inpatient Hospital Stay: Payer: BLUE CROSS/BLUE SHIELD

## 2017-05-13 ENCOUNTER — Other Ambulatory Visit: Payer: Self-pay | Admitting: Oncology

## 2017-05-13 ENCOUNTER — Telehealth: Payer: Self-pay

## 2017-05-13 ENCOUNTER — Encounter: Payer: Self-pay | Admitting: Nurse Practitioner

## 2017-05-13 ENCOUNTER — Inpatient Hospital Stay: Payer: BLUE CROSS/BLUE SHIELD | Attending: Oncology | Admitting: Nurse Practitioner

## 2017-05-13 VITALS — BP 99/72 | HR 97 | Temp 97.8°F | Resp 17 | Ht 65.0 in | Wt 123.6 lb

## 2017-05-13 DIAGNOSIS — C7B8 Other secondary neuroendocrine tumors: Secondary | ICD-10-CM | POA: Insufficient documentation

## 2017-05-13 DIAGNOSIS — C7A019 Malignant carcinoid tumor of the small intestine, unspecified portion: Secondary | ICD-10-CM

## 2017-05-13 DIAGNOSIS — D509 Iron deficiency anemia, unspecified: Secondary | ICD-10-CM | POA: Diagnosis not present

## 2017-05-13 DIAGNOSIS — C7A8 Other malignant neuroendocrine tumors: Secondary | ICD-10-CM | POA: Diagnosis present

## 2017-05-13 LAB — CBC WITH DIFFERENTIAL (CANCER CENTER ONLY)
BASOS ABS: 0 10*3/uL (ref 0.0–0.1)
Basophils Relative: 1 %
EOS PCT: 4 %
Eosinophils Absolute: 0.2 10*3/uL (ref 0.0–0.5)
HCT: 39.4 % (ref 34.8–46.6)
Hemoglobin: 12.1 g/dL (ref 11.6–15.9)
LYMPHS PCT: 38 %
Lymphs Abs: 1.8 10*3/uL (ref 0.9–3.3)
MCH: 24.5 pg — AB (ref 25.1–34.0)
MCHC: 30.7 g/dL — AB (ref 31.5–36.0)
MCV: 79.8 fL (ref 79.5–101.0)
MONO ABS: 0.4 10*3/uL (ref 0.1–0.9)
MONOS PCT: 9 %
Neutro Abs: 2.3 10*3/uL (ref 1.5–6.5)
Neutrophils Relative %: 48 %
PLATELETS: 233 10*3/uL (ref 145–400)
RBC: 4.94 MIL/uL (ref 3.70–5.45)
RDW: 19.2 % — AB (ref 11.2–14.5)
WBC Count: 4.8 10*3/uL (ref 3.9–10.3)

## 2017-05-13 MED ORDER — OCTREOTIDE ACETATE 20 MG IM KIT
20.0000 mg | PACK | Freq: Once | INTRAMUSCULAR | Status: AC
  Administered 2017-05-13: 20 mg via INTRAMUSCULAR

## 2017-05-13 MED ORDER — OCTREOTIDE ACETATE 20 MG IM KIT
PACK | INTRAMUSCULAR | Status: AC
  Filled 2017-05-13: qty 1

## 2017-05-13 NOTE — Progress Notes (Addendum)
  [place] OFFICE PROGRESS NOTE   Diagnosis: Metastatic carcinoid tumor  INTERVAL HISTORY:   Brittney [name] returns as scheduled.  She continues monthly Sandostatin.  She denies nausea/vomiting.  Bowel habits continue to be irregular but some improved.  No diarrhea.  No flushing episodes.  She has a good appetite.  Energy level is some better.  She remains active.  Objective:  Vital signs in last 24 hours:  Blood pressure 99/72, pulse 97, temperature 97.8 F (36.6 C), temperature source Oral, resp. rate 17, height 5\' 5"  (1.651 m), weight 123 lb 9.6 oz (56.1 kg), last menstrual period 01/31/2014, SpO2 100 %.    HEENT: No thrush or ulcers. Lymphatics: No palpable cervical or supraclavicular lymph nodes. Resp: Lungs clear bilaterally. Cardio: Regular rate and rhythm. GI: Abdomen soft and nontender.  No hepatomegaly. Vascular: No leg edema.  Calves soft and nontender.  Lab Results:  Lab Results  Component Value Date   WBC 4.8 05/13/2017   HGB 12.1 05/13/2017   HCT 39.4 05/13/2017   MCV 79.8 05/13/2017   PLT 233 05/13/2017   NEUTROABS 2.3 05/13/2017    Imaging:  No results found.  Medications: I have reviewed the patient's current medications.  Assessment/Plan: 1. Metastatic neuroendocrine tumor ? MRI of the abdomen 10/02/2016 confirmed multiple enhancing liver lesions consistent with metastases, no primary tumor site identified ? Ultrasound-guided biopsy of a left liver lesion 10/07/2016-neuroendocrine neoplasm, "intermediate grade ", review of pathology at GI tumor conference consistent with a low-grade carcinoid tumor ? Elevated chromogranin A 10/15/2016 ? gallium-DOTATATEscan 10/23/2016-multiple foci of liver metastases, enlarged central mesenteric lymph nodes, short intussusception's in adjacent small bowel felt to represent a primary small bowel tumor, additional mesenteric lymph nodes with metabolic activity, deep right pelvic nodule consistent with a  pelvic metastasis ? Small bowel resection 11/21/2016-mid ileum mass, low-grade neuroendocrine tumor,pT4,pN2, 6/18 lymph nodes positive, positive mesenteric resection margin (lymph node), intramural satellite nodule ? CT 11/30/2016-small bowel obstruction with transition point adjacent to suture line at the distal small bowel, liver metastases similar to the 10/02/2016 MRI ? Recurrent obstructive symptoms requiring repeat surgery with resection of the small bowel anastomosis site 12/13/2016, pathology negative for malignancy ? Initiation of monthly Sandostatin 12/20/2016 ? CT abdomen/pelvis 02/19/2017-mild enlargement of liver lesions compared to November 2018  2. 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 Intermittent abdominal bloating and left-sided abdominal pain-improved  4.Admission 11/30/2016 with a small bowel obstruction   Disposition: Brittney [name] appears stable.  Plan to continue monthly Sandostatin.  She has been approved for Lutathera, first treatment scheduled 06/17/2017.  We will see her in follow-up on 07/17/2017 with lab and Sandostatin that day.  She will contact the office in the interim with any problems.  Patient seen with Dr. Benay Spice.    Ned Card ANP/GNP-BC   05/13/2017  9:06 AM  This was a shared visit with Ned Card.  Brittney [name] has received insurance approval for [place].  The plan is to begin [place] next month.  We will see her for an office visit and Sandostatin in 2 months.  Julieanne Manson, MD

## 2017-05-13 NOTE — Telephone Encounter (Signed)
PRINTED AVS AND PATIENT DECLINED A COPY OF HER  CALENDER FOR THE UPCOMING APPOINTMENT. PER 5/7 LOS

## 2017-05-13 NOTE — Patient Instructions (Signed)

## 2017-06-17 ENCOUNTER — Other Ambulatory Visit (HOSPITAL_COMMUNITY): Payer: Self-pay

## 2017-06-17 ENCOUNTER — Encounter (HOSPITAL_COMMUNITY)
Admission: RE | Admit: 2017-06-17 | Discharge: 2017-06-17 | Disposition: A | Discharge status: Home / Self Care | Payer: BLUE CROSS/BLUE SHIELD | Source: Ambulatory Visit | Attending: Oncology | Admitting: Oncology

## 2017-06-17 DIAGNOSIS — C7A019 Malignant carcinoid tumor of the small intestine, unspecified portion: Secondary | ICD-10-CM | POA: Diagnosis not present

## 2017-06-17 LAB — CBC WITH DIFFERENTIAL/PLATELET
BASOS ABS: 0 10*3/uL (ref 0.0–0.1)
BASOS PCT: 1 %
EOS ABS: 0.2 10*3/uL (ref 0.0–0.7)
EOS PCT: 3 %
HEMATOCRIT: 42 % (ref 36.0–46.0)
Hemoglobin: 13.5 g/dL (ref 12.0–15.0)
Lymphocytes Relative: 31 %
Lymphs Abs: 1.6 10*3/uL (ref 0.7–4.0)
MCH: 27.6 pg (ref 26.0–34.0)
MCHC: 32.1 g/dL (ref 30.0–36.0)
MCV: 85.7 fL (ref 78.0–100.0)
MONO ABS: 0.4 10*3/uL (ref 0.1–1.0)
MONOS PCT: 8 %
NEUTROS ABS: 2.9 10*3/uL (ref 1.7–7.7)
Neutrophils Relative %: 57 %
PLATELETS: 234 10*3/uL (ref 150–400)
RBC: 4.9 MIL/uL (ref 3.87–5.11)
RDW: 19 % — AB (ref 11.5–15.5)
WBC: 5.1 10*3/uL (ref 4.0–10.5)

## 2017-06-17 LAB — COMPREHENSIVE METABOLIC PANEL
ALBUMIN: 4 g/dL (ref 3.5–5.0)
ALT: 12 U/L — AB (ref 14–54)
ANION GAP: 8 (ref 5–15)
AST: 21 U/L (ref 15–41)
Alkaline Phosphatase: 62 U/L (ref 38–126)
BILIRUBIN TOTAL: 0.6 mg/dL (ref 0.3–1.2)
BUN: 19 mg/dL (ref 6–20)
CO2: 27 mmol/L (ref 22–32)
CREATININE: 0.84 mg/dL (ref 0.44–1.00)
Calcium: 9.1 mg/dL (ref 8.9–10.3)
Chloride: 106 mmol/L (ref 101–111)
GFR calc Af Amer: >60 mL/min (ref >60)
GFR calc non Af Amer: >60 mL/min (ref >60)
GLUCOSE: 93 mg/dL (ref 65–99)
Potassium: 3.9 mmol/L (ref 3.5–5.1)
Sodium: 141 mmol/L (ref 135–145)
TOTAL PROTEIN: 6.7 g/dL (ref 6.5–8.1)

## 2017-06-17 LAB — HCG, SERUM, QUALITATIVE: PREG SERUM: NEGATIVE

## 2017-06-17 MED ORDER — LUTETIUM LU 177 DOTATATE 370 MBQ/ML IV SOLN
200.0000 | Freq: Once | INTRAVENOUS | Status: AC
  Administered 2017-06-17: 203.14 via INTRAVENOUS

## 2017-06-17 MED ORDER — OCTREOTIDE ACETATE 500 MCG/ML IJ SOLN
INTRAMUSCULAR | Status: AC
  Filled 2017-06-17: qty 1

## 2017-06-17 MED ORDER — OCTREOTIDE ACETATE 30 MG IM KIT
PACK | INTRAMUSCULAR | Status: AC
  Filled 2017-06-17: qty 1

## 2017-06-17 MED ORDER — OCTREOTIDE ACETATE 30 MG IM KIT
30.0000 mg | PACK | Freq: Once | INTRAMUSCULAR | Status: AC
  Administered 2017-06-17: 30 mg via INTRAMUSCULAR

## 2017-06-17 MED ORDER — AMINO ACID RADIOPROTECTANT - L-LYSINE 2.5%/L-ARGININE 2.5% IN NS
250.0000 mL/h | INTRAVENOUS | Status: AC
  Administered 2017-06-17: 250 mL/h via INTRAVENOUS
  Filled 2017-06-17: qty 1000

## 2017-06-17 MED ORDER — PROCHLORPERAZINE EDISYLATE 10 MG/2ML IJ SOLN
10.0000 mg | Freq: Four times a day (QID) | INTRAMUSCULAR | Status: DC | PRN
  Filled 2017-06-17: qty 2

## 2017-06-17 MED ORDER — OCTREOTIDE ACETATE 500 MCG/ML IJ SOLN: 500.0000 ug | Freq: Once | INTRAMUSCULAR | Status: DC | PRN

## 2017-06-17 MED ORDER — ONDANSETRON HCL 8 MG PO TABS: 8.0000 mg | ORAL_TABLET | Freq: Two times a day (BID) | ORAL | 0 refills | Status: DC | PRN

## 2017-06-17 MED ORDER — SODIUM CHLORIDE 0.9 % IV SOLN: 500.0000 mL | Freq: Once | INTRAVENOUS | Status: DC

## 2017-06-17 MED ORDER — SODIUM CHLORIDE 0.9 % IV SOLN
8.0000 mg | Freq: Once | INTRAVENOUS | Status: AC
  Administered 2017-06-17: 8 mg via INTRAVENOUS
  Filled 2017-06-17: qty 4

## 2017-06-17 NOTE — Progress Notes (Signed)
Lutathera infusion completed at 10:53am.  Pt tolerate infusion and did not report any symptoms.  Pt's IV in RAC was flushed with 6ml of NaCl.  No signs of infiltration noted pt's IV.  Pt encouraged to void.  VSS. Will continue to monitor.

## 2017-06-17 NOTE — Progress Notes (Signed)
RADIOPHARMACEUTICALS:  [203.4] mCi Lu 177 DOTATATE  FINDINGS: Diagnosis: [Metastatic neuroendocrine tumor.]  Current Infusion: [1]  Planned Infusions: [4]  Patient presented to nuclear medicine for treatment. The patient's most recent blood counts were reviewed and remains a good candidate to proceed with Lutathera.  Risks and benefits of the procedure were explained and all questions answered.  Informed consent was signed.  The patient was situated in an infusion suite and administered Lutathera as above. Patient will follow-up with referring oncologist for interval serum laboratories (approximately 4 weeks).  Patient may receive interval Sandostatin injection at that time   Patient received 30 mg IM long-acting Sandostatin injection 4 hours after Lutathera effusion in the nuclear medicine department.   IMPRESSION:  [First] FF 638 [id number] treatment for metastatic neuroendocrine tumor. The patient tolerated the infusion well. The patient will return in 8 weeks for next schedule peptide receptor radiotherapy.

## 2017-06-18 LAB — CHROMOGRANIN A: Chromogranin A: 2 nmol/L (ref 0–5)

## 2017-07-16 ENCOUNTER — Inpatient Hospital Stay: Payer: BLUE CROSS/BLUE SHIELD | Attending: Oncology | Admitting: Oncology

## 2017-07-16 ENCOUNTER — Inpatient Hospital Stay: Payer: BLUE CROSS/BLUE SHIELD

## 2017-07-16 ENCOUNTER — Telehealth: Payer: Self-pay

## 2017-07-16 VITALS — BP 108/73 | HR 100 | Temp 97.7°F | Resp 18 | Ht 65.0 in | Wt 122.5 lb

## 2017-07-16 DIAGNOSIS — E340 Carcinoid syndrome: Secondary | ICD-10-CM

## 2017-07-16 DIAGNOSIS — C7A019 Malignant carcinoid tumor of the small intestine, unspecified portion: Secondary | ICD-10-CM

## 2017-07-16 DIAGNOSIS — C7A8 Other malignant neuroendocrine tumors: Secondary | ICD-10-CM | POA: Diagnosis not present

## 2017-07-16 DIAGNOSIS — K625 Hemorrhage of anus and rectum: Secondary | ICD-10-CM | POA: Diagnosis not present

## 2017-07-16 DIAGNOSIS — C7B8 Other secondary neuroendocrine tumors: Secondary | ICD-10-CM | POA: Diagnosis not present

## 2017-07-16 LAB — CMP (CANCER CENTER ONLY)
ALBUMIN: 4.1 g/dL (ref 3.5–5.0)
ALT: 12 U/L (ref 0–44)
ANION GAP: 8 (ref 5–15)
AST: 18 U/L (ref 15–41)
Alkaline Phosphatase: 71 U/L (ref 38–126)
BUN: 20 mg/dL (ref 6–20)
CO2: 27 mmol/L (ref 22–32)
Calcium: 9 mg/dL (ref 8.9–10.3)
Chloride: 107 mmol/L (ref 98–111)
Creatinine: 0.76 mg/dL (ref 0.44–1.00)
GFR, Est AFR Am: >60 mL/min (ref >60)
GFR, Estimated: >60 mL/min (ref >60)
GLUCOSE: 96 mg/dL (ref 70–99)
POTASSIUM: 4 mmol/L (ref 3.5–5.1)
SODIUM: 142 mmol/L (ref 135–145)
Total Bilirubin: 0.3 mg/dL (ref 0.3–1.2)
Total Protein: 6.8 g/dL (ref 6.5–8.1)

## 2017-07-16 LAB — CBC WITH DIFFERENTIAL (CANCER CENTER ONLY)
BASOS PCT: 1 %
Basophils Absolute: 0 10*3/uL (ref 0.0–0.1)
EOS ABS: 0.2 10*3/uL (ref 0.0–0.5)
EOS PCT: 4 %
HEMATOCRIT: 40.2 % (ref 34.8–46.6)
HEMOGLOBIN: 13.2 g/dL (ref 11.6–15.9)
Lymphocytes Relative: 30 %
Lymphs Abs: 1.4 10*3/uL (ref 0.9–3.3)
MCH: 28.6 pg (ref 25.1–34.0)
MCHC: 32.8 g/dL (ref 31.5–36.0)
MCV: 87 fL (ref 79.5–101.0)
MONO ABS: 0.5 10*3/uL (ref 0.1–0.9)
MONOS PCT: 11 %
NEUTROS ABS: 2.7 10*3/uL (ref 1.5–6.5)
NEUTROS PCT: 54 %
Platelet Count: 179 10*3/uL (ref 145–400)
RBC: 4.62 MIL/uL (ref 3.70–5.45)
RDW: 15.4 % — ABNORMAL HIGH (ref 11.2–14.5)
WBC Count: 4.9 10*3/uL (ref 3.9–10.3)

## 2017-07-16 MED ORDER — OCTREOTIDE ACETATE 20 MG IM KIT
20.0000 mg | PACK | Freq: Once | INTRAMUSCULAR | Status: AC
  Administered 2017-07-16: 20 mg via INTRAMUSCULAR

## 2017-07-16 NOTE — Patient Instructions (Signed)

## 2017-07-16 NOTE — Progress Notes (Signed)
[place] OFFICE PROGRESS NOTE   Diagnosis: Carcinoid tumor  INTERVAL HISTORY:   Brittney [name] returns as scheduled.  She completed a first treatment with Lutathera on 06/17/2017.  She had mild nausea following the Lutathera.  She noted increased diarrhea following treatment.  She has approximately 6 bowel movements per day.  Imodium cause constipation.  Metamucil has helped regulate her bowel movements.  She is eating well and exercising. She developed bleeding with bowel movements on 07/12/2017 and 07/13/2017.  This has resolved. Brittney [name] has relocated to [facility].  She plans to continue treatment of the carcinoid tumor in [place].  Objective:  Vital signs in last 24 hours:  Blood pressure 108/73, pulse 100, temperature 97.7 F (36.5 C), temperature source Oral, resp. rate 18, height 5\' 5"  (1.651 m), weight 122 lb 8 oz (55.6 kg), last menstrual period 01/31/2014, SpO2 100 %.    HEENT: No thrush or ulcers Resp: Lungs clear bilaterally Cardio: Regular rate and rhythm with an occasional premature beat or pause GI: Nondistended, no hepatosplenomegaly, no mass Vascular: No leg edema   Lab Results:  Lab Results  Component Value Date   WBC 4.9 07/16/2017   HGB 13.2 07/16/2017   HCT 40.2 07/16/2017   MCV 87.0 07/16/2017   PLT 179 07/16/2017   NEUTROABS 2.7 07/16/2017    CMP  Lab Results  Component Value Date   NA 141 06/16/2017   K 3.9 06/16/2017   CL 106 06/16/2017   CO2 27 06/16/2017   GLUCOSE 93 06/16/2017   BUN 19 06/16/2017   CREATININE 0.84 06/16/2017   CALCIUM 9.1 06/16/2017   PROT 6.7 06/16/2017   ALBUMIN 4.0 06/16/2017   AST 21 06/16/2017   ALT 12 (L) 06/16/2017   ALKPHOS 62 06/16/2017   BILITOT 0.6 06/16/2017   GFRNONAA >60 06/16/2017   GFRAA >60 06/16/2017    Medications: I have reviewed the patient's current medications.   Assessment/Plan: 1. Metastatic neuroendocrine tumor ? MRI of the abdomen 10/02/2016 confirmed multiple  enhancing liver lesions consistent with metastases, no primary tumor site identified ? Ultrasound-guided biopsy of a left liver lesion 10/07/2016-neuroendocrine neoplasm, "intermediate grade ", review of pathology at GI tumor conference consistent with a low-grade carcinoid tumor ? Elevated chromogranin A 10/15/2016 ? gallium-DOTATATEscan 10/23/2016-multiple foci of liver metastases, enlarged central mesenteric lymph nodes, short intussusception's in adjacent small bowel felt to represent a primary small bowel tumor, additional mesenteric lymph nodes with metabolic activity, deep right pelvic nodule consistent with a pelvic metastasis ? Small bowel resection 11/21/2016-mid ileum mass, low-grade neuroendocrine tumor,pT4,pN2, 6/18 lymph nodes positive, positive mesenteric resection margin (lymph node), intramural satellite nodule ? CT 11/30/2016-small bowel obstruction with transition point adjacent to suture line at the distal small bowel, liver metastases similar to the 10/02/2016 MRI ? Recurrent obstructive symptoms requiring repeat surgery with resection of the small bowel anastomosis site 12/13/2016, pathology negative for malignancy ? Initiation of monthly Sandostatin 12/20/2016 ? CT abdomen/pelvis 02/19/2017-mild enlargement of liver lesions compared to November 2018 ? Cycle 1 Lutathera 06/17/2017  2. 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 Intermittent abdominal bloating and left-sided abdominal pain-improved  4.Admission 11/30/2016 with a small bowel obstruction   Disposition: Brittney [name] began treatment with Lutathera on 06/17/2017.  She tolerated the Lutathera well.  The etiology of the rectal bleeding last weekend is unclear.  We will make a GI referral for recurrent bleeding.  She is scheduled for the next Lutathera treatment  on 08/13/2017.  She received Sandostatin today.  Brittney [name] will discontinue  iron.  This could be contributing to the diarrhea.  She will return for an office visit and Sandostatin on 09/12/2017.  15 minutes were spent with the patient today.  The majority of the time was used for counseling and coordination of care.  Brittney Coder, MD  07/16/2017  3:23 PM

## 2017-07-16 NOTE — Telephone Encounter (Signed)
Printed avs and patient declined calender. Per 7/10 los

## 2017-07-17 ENCOUNTER — Other Ambulatory Visit: Payer: Self-pay

## 2017-07-17 ENCOUNTER — Ambulatory Visit: Payer: Self-pay

## 2017-07-17 ENCOUNTER — Ambulatory Visit: Payer: Self-pay | Admitting: Oncology

## 2017-08-13 ENCOUNTER — Ambulatory Visit (HOSPITAL_COMMUNITY)
Admission: RE | Admit: 2017-08-13 | Discharge: 2017-08-13 | Disposition: A | Discharge status: Home / Self Care | Payer: BLUE CROSS/BLUE SHIELD | Source: Ambulatory Visit | Attending: Oncology | Admitting: Oncology

## 2017-08-13 DIAGNOSIS — C801 Malignant (primary) neoplasm, unspecified: Secondary | ICD-10-CM | POA: Insufficient documentation

## 2017-08-13 DIAGNOSIS — C7A8 Other malignant neuroendocrine tumors: Secondary | ICD-10-CM | POA: Insufficient documentation

## 2017-08-13 DIAGNOSIS — R197 Diarrhea, unspecified: Secondary | ICD-10-CM | POA: Diagnosis not present

## 2017-08-13 DIAGNOSIS — Z780 Asymptomatic menopausal state: Secondary | ICD-10-CM | POA: Insufficient documentation

## 2017-08-13 DIAGNOSIS — R110 Nausea: Secondary | ICD-10-CM | POA: Insufficient documentation

## 2017-08-13 DIAGNOSIS — C7A019 Malignant carcinoid tumor of the small intestine, unspecified portion: Secondary | ICD-10-CM | POA: Diagnosis present

## 2017-08-13 LAB — CBC WITH DIFFERENTIAL/PLATELET
Basophils Absolute: 0 10*3/uL (ref 0.0–0.1)
Basophils Relative: 1 %
EOS ABS: 0.1 10*3/uL (ref 0.0–0.7)
Eosinophils Relative: 4 %
HEMATOCRIT: 44.4 % (ref 36.0–46.0)
HEMOGLOBIN: 14.7 g/dL (ref 12.0–15.0)
LYMPHS ABS: 1 10*3/uL (ref 0.7–4.0)
LYMPHS PCT: 27 %
MCH: 29.6 pg (ref 26.0–34.0)
MCHC: 33.1 g/dL (ref 30.0–36.0)
MCV: 89.3 fL (ref 78.0–100.0)
Monocytes Absolute: 0.5 10*3/uL (ref 0.1–1.0)
Monocytes Relative: 13 %
NEUTROS ABS: 2.1 10*3/uL (ref 1.7–7.7)
NEUTROS PCT: 55 %
Platelets: 197 10*3/uL (ref 150–400)
RBC: 4.97 MIL/uL (ref 3.87–5.11)
RDW: 14.1 % (ref 11.5–15.5)
WBC: 3.7 10*3/uL — ABNORMAL LOW (ref 4.0–10.5)

## 2017-08-13 LAB — COMPREHENSIVE METABOLIC PANEL
ALK PHOS: 61 U/L (ref 38–126)
ALT: 14 U/L (ref 0–44)
AST: 35 U/L (ref 15–41)
Albumin: 4.3 g/dL (ref 3.5–5.0)
Anion gap: 8 (ref 5–15)
BILIRUBIN TOTAL: 1.1 mg/dL (ref 0.3–1.2)
BUN: 18 mg/dL (ref 6–20)
CALCIUM: 9.2 mg/dL (ref 8.9–10.3)
CO2: 29 mmol/L (ref 22–32)
CREATININE: 0.88 mg/dL (ref 0.44–1.00)
Chloride: 104 mmol/L (ref 98–111)
GFR calc Af Amer: >60 mL/min (ref >60)
GFR calc non Af Amer: >60 mL/min (ref >60)
Glucose, Bld: 103 mg/dL — ABNORMAL HIGH (ref 70–99)
Potassium: 4.9 mmol/L (ref 3.5–5.1)
SODIUM: 141 mmol/L (ref 135–145)
TOTAL PROTEIN: 7.5 g/dL (ref 6.5–8.1)

## 2017-08-13 MED ORDER — OCTREOTIDE ACETATE 500 MCG/ML IJ SOLN
INTRAMUSCULAR | Status: AC
  Filled 2017-08-13: qty 1

## 2017-08-13 MED ORDER — OCTREOTIDE ACETATE 30 MG IM KIT
PACK | INTRAMUSCULAR | Status: AC
  Filled 2017-08-13: qty 1

## 2017-08-13 MED ORDER — OCTREOTIDE ACETATE 30 MG IM KIT
30.0000 mg | PACK | Freq: Once | INTRAMUSCULAR | Status: AC
  Administered 2017-08-13: 30 mg via INTRAMUSCULAR

## 2017-08-13 MED ORDER — ONDANSETRON HCL 8 MG PO TABS: 8.0000 mg | ORAL_TABLET | Freq: Two times a day (BID) | ORAL | 0 refills | Status: DC | PRN

## 2017-08-13 MED ORDER — PROCHLORPERAZINE EDISYLATE 10 MG/2ML IJ SOLN
10.0000 mg | Freq: Four times a day (QID) | INTRAMUSCULAR | Status: DC | PRN
  Filled 2017-08-13: qty 2

## 2017-08-13 MED ORDER — SODIUM CHLORIDE 0.9 % IV SOLN
8.0000 mg | Freq: Once | INTRAVENOUS | Status: AC
  Administered 2017-08-13: 8 mg via INTRAVENOUS
  Filled 2017-08-13: qty 4

## 2017-08-13 MED ORDER — SODIUM CHLORIDE 0.9 % IV SOLN: 500.0000 mL | Freq: Once | INTRAVENOUS | Status: DC

## 2017-08-13 MED ORDER — LUTETIUM LU 177 DOTATATE 370 MBQ/ML IV SOLN
200.0000 | Freq: Once | INTRAVENOUS | Status: AC
  Administered 2017-08-13: 204 via INTRAVENOUS

## 2017-08-13 MED ORDER — OCTREOTIDE ACETATE 500 MCG/ML IJ SOLN: 500.0000 ug | Freq: Once | INTRAMUSCULAR | Status: DC | PRN

## 2017-08-13 MED ORDER — AMINO ACID RADIOPROTECTANT - L-LYSINE 2.5%/L-ARGININE 2.5% IN NS
250.0000 mL/h | INTRAVENOUS | Status: AC
  Administered 2017-08-13: 250 mL/h via INTRAVENOUS
  Filled 2017-08-13: qty 1000

## 2017-08-13 NOTE — Progress Notes (Signed)
RADIOPHARMACEUTICALS:  [204] mCi Lu 177 DOTATATE  FINDINGS: Diagnosis: [Metastatic neuroendocrine tumor.]  Current Infusion: [2]  Planned Infusions: [4]  Patient reports [intermittent diarrhea] symptoms following therapy.  Minimal nausea.  Difficult to ascertain if diarrhea is resulting from prior surgeries or carcinoid type syndrome symptoms.  Favor a surgical origin as patient did not have carcinoid symptoms preceding diagnosis/surgery.  Symptoms could relate to peptide receptor radiotherapy additionally.    The patient's most recent blood counts were reviewed and remains a good candidate to proceed with Lutathera. Patient is post menopausal (greater than 3 years since last period).  The patient was situated in an infusion suite and administered Lutathera as above. Patient will follow-up with referring oncologist for interval serum laboratories (CBC and CMP) in approximately 4 weeks.    Patient received 30 mg IM long-acting Sandostatin injection 4 hours after Lutathera effusion in the nuclear medicine department.   IMPRESSION: [Second] RR 116 [id number] treatment for metastatic neuroendocrine tumor. The patient tolerated the infusion well. The patient will return in 8 weeks for ongoing care.

## 2017-09-09 ENCOUNTER — Telehealth: Payer: Self-pay
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
## 2017-09-11 ENCOUNTER — Inpatient Hospital Stay: Payer: BLUE CROSS/BLUE SHIELD | Attending: Oncology | Admitting: Oncology

## 2017-09-11 ENCOUNTER — Inpatient Hospital Stay: Payer: BLUE CROSS/BLUE SHIELD

## 2017-09-11 ENCOUNTER — Telehealth: Payer: Self-pay | Admitting: Oncology

## 2017-09-11 VITALS — BP 110/70 | HR 57 | Temp 98.3°F | Resp 20 | Ht 65.0 in | Wt 123.5 lb

## 2017-09-11 DIAGNOSIS — C7A019 Malignant carcinoid tumor of the small intestine, unspecified portion: Secondary | ICD-10-CM

## 2017-09-11 DIAGNOSIS — Z79899 Other long term (current) drug therapy: Secondary | ICD-10-CM | POA: Insufficient documentation

## 2017-09-11 DIAGNOSIS — E340 Carcinoid syndrome: Secondary | ICD-10-CM | POA: Insufficient documentation

## 2017-09-11 DIAGNOSIS — C7A8 Other malignant neuroendocrine tumors: Secondary | ICD-10-CM

## 2017-09-11 DIAGNOSIS — C7B8 Other secondary neuroendocrine tumors: Secondary | ICD-10-CM | POA: Insufficient documentation

## 2017-09-11 LAB — CBC WITH DIFFERENTIAL (CANCER CENTER ONLY)
BASOS ABS: 0 10*3/uL (ref 0.0–0.1)
Basophils Relative: 1 %
EOS PCT: 6 %
Eosinophils Absolute: 0.2 10*3/uL (ref 0.0–0.5)
HEMATOCRIT: 43 % (ref 34.8–46.6)
Hemoglobin: 14 g/dL (ref 11.6–15.9)
LYMPHS ABS: 1 10*3/uL (ref 0.9–3.3)
LYMPHS PCT: 31 %
MCH: 29.7 pg (ref 25.1–34.0)
MCHC: 32.6 g/dL (ref 31.5–36.0)
MCV: 91.3 fL (ref 79.5–101.0)
Monocytes Absolute: 0.3 10*3/uL (ref 0.1–0.9)
Monocytes Relative: 10 %
NEUTROS ABS: 1.7 10*3/uL (ref 1.5–6.5)
Neutrophils Relative %: 52 %
PLATELETS: 153 10*3/uL (ref 145–400)
RBC: 4.71 MIL/uL (ref 3.70–5.45)
RDW: 13.4 % (ref 11.2–14.5)
WBC: 3.3 10*3/uL — AB (ref 3.9–10.3)

## 2017-09-11 LAB — CMP (CANCER CENTER ONLY)
ALBUMIN: 3.9 g/dL (ref 3.5–5.0)
ALT: 17 U/L (ref 0–44)
ANION GAP: 11 (ref 5–15)
AST: 17 U/L (ref 15–41)
Alkaline Phosphatase: 72 U/L (ref 38–126)
BUN: 19 mg/dL (ref 6–20)
CHLORIDE: 107 mmol/L (ref 98–111)
CO2: 25 mmol/L (ref 22–32)
Calcium: 9.3 mg/dL (ref 8.9–10.3)
Creatinine: 0.79 mg/dL (ref 0.44–1.00)
GFR, Est AFR Am: >60 mL/min (ref >60)
GLUCOSE: 51 mg/dL — AB (ref 70–99)
POTASSIUM: 4.1 mmol/L (ref 3.5–5.1)
Sodium: 143 mmol/L (ref 135–145)
TOTAL PROTEIN: 7.1 g/dL (ref 6.5–8.1)
Total Bilirubin: 0.5 mg/dL (ref 0.3–1.2)

## 2017-09-11 MED ORDER — OCTREOTIDE ACETATE 30 MG IM KIT
PACK | INTRAMUSCULAR | Status: AC
  Filled 2017-09-11: qty 1

## 2017-09-11 MED ORDER — OCTREOTIDE ACETATE 30 MG IM KIT
30.0000 mg | PACK | Freq: Once | INTRAMUSCULAR | Status: AC
  Administered 2017-09-11: 30 mg via INTRAMUSCULAR

## 2017-09-11 NOTE — Progress Notes (Signed)
[place] OFFICE PROGRESS NOTE   Diagnosis: Carcinoid tumor  INTERVAL HISTORY:   Ms. Ferdinand returns as scheduled.  She completed another treatment with Lutathera on 08/13/2017.  She reports mild nausea for 3 to 4 days following the Lutathera.  She continues to have frequent bowel movements.  No frank diarrhea.  She has noted a purpuric rash over the arms for years.  She has a similar rash from scratching at her left thigh.  No bleeding. She is running 20 miles per week.  Objective:  Vital signs in last 24 hours:  Blood pressure 110/70, pulse (!) 57, temperature 98.3 F (36.8 C), temperature source Oral, resp. rate 20, height 5\' 5"  (1.651 m), weight 123 lb 8 oz (56 kg), last menstrual period 01/31/2014, SpO2 98 %.    HEENT: No thrush or ulcers Resp: Clear bilaterally Cardio: Regular rate and rhythm GI: No hepatomegaly, nontender, no mass Vascular: No leg edema  Skin: Mild purpuric rash at the forearm bilaterally and at the left inner thigh   Lab Results:  Lab Results  Component Value Date   WBC 3.7 (L) 08/13/2017   HGB 14.7 08/13/2017   HCT 44.4 08/13/2017   MCV 89.3 08/13/2017   PLT 197 08/13/2017   NEUTROABS 2.1 08/13/2017    CMP  Lab Results  Component Value Date   NA 141 08/13/2017   K 4.9 08/13/2017   CL 104 08/13/2017   CO2 29 08/13/2017   GLUCOSE 103 (H) 08/13/2017   BUN 18 08/13/2017   CREATININE 0.88 08/13/2017   CALCIUM 9.2 08/13/2017   PROT 7.5 08/13/2017   ALBUMIN 4.3 08/13/2017   AST 35 08/13/2017   ALT 14 08/13/2017   ALKPHOS 61 08/13/2017   BILITOT 1.1 08/13/2017   GFRNONAA >60 08/13/2017   GFRAA >60 08/13/2017    Medications: I have reviewed the patient's current medications.   Assessment/Plan: 1. Metastatic neuroendocrine tumor ? MRI of the abdomen 10/02/2016 confirmed multiple enhancing liver lesions consistent with metastases, no primary tumor site identified ? Ultrasound-guided biopsy of a left liver lesion  10/07/2016-neuroendocrine neoplasm, "intermediate grade ", review of pathology at GI tumor conference consistent with a low-grade carcinoid tumor ? Elevated chromogranin A 10/15/2016 ? gallium-DOTATATEscan 10/23/2016-multiple foci of liver metastases, enlarged central mesenteric lymph nodes, short intussusception's in adjacent small bowel felt to represent a primary small bowel tumor, additional mesenteric lymph nodes with metabolic activity, deep right pelvic nodule consistent with a pelvic metastasis ? Small bowel resection 11/21/2016-mid ileum mass, low-grade neuroendocrine tumor,pT4,pN2, 6/18 lymph nodes positive, positive mesenteric resection margin (lymph node), intramural satellite nodule ? CT 11/30/2016-small bowel obstruction with transition point adjacent to suture line at the distal small bowel, liver metastases similar to the 10/02/2016 MRI ? Recurrent obstructive symptoms requiring repeat surgery with resection of the small bowel anastomosis site 12/13/2016, pathology negative for malignancy ? Initiation of monthly Sandostatin 12/20/2016 ? CT abdomen/pelvis 02/19/2017-mild enlargement of liver lesions compared to November 2018 ? Cycle 1 Lutathera 06/17/2017 ? Cycle 2 Lutathera 08/13/2017  2. 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 Intermittent abdominal bloating and left-sided abdominal pain-improved  4.Admission 11/30/2016 with a small bowel obstruction     Disposition: Ms. Cullifer appears well.  She has completed 2 treatments with Lutathera.  She is scheduled for cycle 3 on 10/07/2017.  She will return for an office visit and Sandostatin on 11/04/2017.  The rash has been present for years and may potentially be  related to the carcinoid disease.  We will check the platelet count today.  15 minutes were spent with the patient today.  The majority of the time was used for counseling and coordination of  care.  [name], MD  09/11/2017  8:23 AM

## 2017-09-11 NOTE — Telephone Encounter (Signed)
Scheduled per 9/5 los - pt did not want los or calendar with appts.

## 2017-09-12 ENCOUNTER — Ambulatory Visit: Payer: Self-pay

## 2017-09-12 ENCOUNTER — Other Ambulatory Visit: Payer: Self-pay

## 2017-09-12 ENCOUNTER — Ambulatory Visit: Payer: Self-pay | Admitting: Oncology

## 2017-10-07 ENCOUNTER — Telehealth: Payer: Self-pay | Admitting: Oncology

## 2017-10-07 ENCOUNTER — Other Ambulatory Visit: Payer: Self-pay | Admitting: Oncology

## 2017-10-07 ENCOUNTER — Ambulatory Visit (HOSPITAL_COMMUNITY)
Admission: RE | Admit: 2017-10-07 | Discharge: 2017-10-07 | Disposition: A | Discharge status: Home / Self Care | Payer: BLUE CROSS/BLUE SHIELD | Source: Ambulatory Visit | Attending: Oncology | Admitting: Oncology

## 2017-10-07 DIAGNOSIS — C7A019 Malignant carcinoid tumor of the small intestine, unspecified portion: Secondary | ICD-10-CM | POA: Insufficient documentation

## 2017-10-07 DIAGNOSIS — R197 Diarrhea, unspecified: Secondary | ICD-10-CM | POA: Diagnosis not present

## 2017-10-07 DIAGNOSIS — D3A019 Benign carcinoid tumor of the small intestine, unspecified portion: Secondary | ICD-10-CM

## 2017-10-07 DIAGNOSIS — D72819 Decreased white blood cell count, unspecified: Secondary | ICD-10-CM | POA: Diagnosis not present

## 2017-10-07 LAB — CBC
HEMATOCRIT: 38.9 % (ref 36.0–46.0)
HEMOGLOBIN: 12.8 g/dL (ref 12.0–15.0)
MCH: 30 pg (ref 26.0–34.0)
MCHC: 32.9 g/dL (ref 30.0–36.0)
MCV: 91.3 fL (ref 78.0–100.0)
Platelets: 232 10*3/uL (ref 150–400)
RBC: 4.26 MIL/uL (ref 3.87–5.11)
RDW: 13.9 % (ref 11.5–15.5)
WBC: 3.2 10*3/uL — AB (ref 4.0–10.5)

## 2017-10-07 LAB — COMPREHENSIVE METABOLIC PANEL
ALK PHOS: 57 U/L (ref 38–126)
ALT: 16 U/L (ref 0–44)
AST: 24 U/L (ref 15–41)
Albumin: 3.6 g/dL (ref 3.5–5.0)
Anion gap: 9 (ref 5–15)
BILIRUBIN TOTAL: 0.8 mg/dL (ref 0.3–1.2)
BUN: 19 mg/dL (ref 6–20)
CALCIUM: 9.1 mg/dL (ref 8.9–10.3)
CHLORIDE: 104 mmol/L (ref 98–111)
CO2: 27 mmol/L (ref 22–32)
CREATININE: 0.78 mg/dL (ref 0.44–1.00)
GFR calc non Af Amer: >60 mL/min (ref >60)
Glucose, Bld: 139 mg/dL — ABNORMAL HIGH (ref 70–99)
Potassium: 4.1 mmol/L (ref 3.5–5.1)
Sodium: 140 mmol/L (ref 135–145)
TOTAL PROTEIN: 6.4 g/dL — AB (ref 6.5–8.1)

## 2017-10-07 MED ORDER — OCTREOTIDE ACETATE 30 MG IM KIT
30.0000 mg | PACK | Freq: Once | INTRAMUSCULAR | Status: AC
  Administered 2017-10-07: 30 mg via INTRAMUSCULAR

## 2017-10-07 MED ORDER — PROCHLORPERAZINE EDISYLATE 10 MG/2ML IJ SOLN
10.0000 mg | Freq: Four times a day (QID) | INTRAMUSCULAR | Status: DC | PRN
  Filled 2017-10-07: qty 2

## 2017-10-07 MED ORDER — LUTETIUM LU 177 DOTATATE 370 MBQ/ML IV SOLN
200.0000 | Freq: Once | INTRAVENOUS | Status: AC
  Administered 2017-10-07: 204.55 via INTRAVENOUS

## 2017-10-07 MED ORDER — OCTREOTIDE ACETATE 500 MCG/ML IJ SOLN: 500.0000 ug | Freq: Once | INTRAMUSCULAR | Status: DC | PRN

## 2017-10-07 MED ORDER — ONDANSETRON HCL 8 MG PO TABS: 8.0000 mg | ORAL_TABLET | Freq: Two times a day (BID) | ORAL | 0 refills | Status: DC | PRN

## 2017-10-07 MED ORDER — SODIUM CHLORIDE 0.9 % IV SOLN: 500.0000 mL | Freq: Once | INTRAVENOUS | Status: DC

## 2017-10-07 MED ORDER — SODIUM CHLORIDE 0.9 % IV SOLN
8.0000 mg | Freq: Once | INTRAVENOUS | Status: AC
  Administered 2017-10-07: 8 mg via INTRAVENOUS
  Filled 2017-10-07: qty 4

## 2017-10-07 MED ORDER — OCTREOTIDE ACETATE 30 MG IM KIT
PACK | INTRAMUSCULAR | Status: AC
  Filled 2017-10-07: qty 1

## 2017-10-07 MED ORDER — AMINO ACID RADIOPROTECTANT - L-LYSINE 2.5%/L-ARGININE 2.5% IN NS
250.0000 mL/h | INTRAVENOUS | Status: AC
  Administered 2017-10-07: 250 mL/h via INTRAVENOUS
  Filled 2017-10-07: qty 1000

## 2017-10-07 NOTE — Telephone Encounter (Signed)
Tried to call regarding voicemail I did leave a message  °

## 2017-10-07 NOTE — Progress Notes (Signed)
CLINICAL DATA:  [[age]] year-old [female] with metastatic neuroendocrine tumor. Well differentiated tumor with somatostatin receptor is identified within the [liver and central mesentery] by DOTATATE PET CT scan. EXAM:  NUCLEAR MEDICINE LUTATHERA ADMINISTRATION TECHNIQUE:  Infusion: The nuclear medicine technologist and I personally verified the dose activity ([206] mCi) to be delivered as specified in the written directive (200 mCi), and verified the patient identification via 2 separate methods. 20 gauge IV were started in the antecubital veins. Anti-emetics were administered by nursing staff. Amino acid renal protection was initiated 30 minutes prior to Lu 177 DOTATATE (Lutathera) infusion and continued continuously for 4 hours. Lutathera infusion was administered over 30 minutes.  The total administered dose was [204.5] mCi Lu 177 DOTATATE. The entire IV tubing, venocatheter, stopcock and syringes was removed in total, placed in a disposal bag and sent for assay of the residual activity, which will be reported at a later time in our EMR by the physics staff. Pressure was applied to the venipuncture sites, and a compression bandage placed. Radiation Safety personnel were present to perform the discharge survey, as detailed on their documentation. Patient received 30 mg IM long-acting Sandostatin injection 4 hours after Lutathera effusion in the nuclear medicine department.   RADIOPHARMACEUTICALS:  [204.5] mCi Lu 177 DOTATATE  FINDINGS:  Diagnosis: [Metastatic neuroendocrine tumor.] Current Infusion: [3 Planned Infusions: [4]  Patient reports [minimal] interval symptoms following therapy. Minimal diarrhea. Patient continues to exercise rigorously.   The patient's most recent blood counts were reviewed and remains a good candidate to proceed with Lutathera. Only mild leukopenia.   The patient was situated in an infusion suite and administered Lutathera as above. Patient will follow-up with  referring oncologist for interval serum laboratories (CBC and CMP) in approximately 4 weeks.   Patient received 30 mg IM long-acting Sandostatin injection 4 hours after Lutathera effusion in the nuclear medicine department.   IMPRESSION:  [Third] VQ 259 [id number] treatment for metastatic neuroendocrine tumor. The patient tolerated the infusion well. The patient will return in 8 weeks for ongoing care.

## 2017-10-08 ENCOUNTER — Telehealth: Payer: Self-pay | Admitting: Oncology

## 2017-10-08 NOTE — Telephone Encounter (Signed)
Tried to call regarding the voicemail, I did leave a message

## 2017-11-03 ENCOUNTER — Other Ambulatory Visit: Payer: Self-pay | Admitting: Oncology

## 2017-11-04 ENCOUNTER — Telehealth: Payer: Self-pay

## 2017-11-04 ENCOUNTER — Inpatient Hospital Stay: Payer: BLUE CROSS/BLUE SHIELD

## 2017-11-04 ENCOUNTER — Inpatient Hospital Stay: Payer: BLUE CROSS/BLUE SHIELD | Attending: Oncology | Admitting: Oncology

## 2017-11-04 VITALS — BP 98/61 | HR 52 | Temp 97.7°F | Resp 18 | Ht 65.0 in | Wt 124.5 lb

## 2017-11-04 DIAGNOSIS — C787 Secondary malignant neoplasm of liver and intrahepatic bile duct: Secondary | ICD-10-CM

## 2017-11-04 DIAGNOSIS — C7A019 Malignant carcinoid tumor of the small intestine, unspecified portion: Secondary | ICD-10-CM

## 2017-11-04 DIAGNOSIS — C7A8 Other malignant neuroendocrine tumors: Secondary | ICD-10-CM | POA: Diagnosis present

## 2017-11-04 DIAGNOSIS — K561 Intussusception: Secondary | ICD-10-CM

## 2017-11-04 DIAGNOSIS — Z79899 Other long term (current) drug therapy: Secondary | ICD-10-CM

## 2017-11-04 LAB — CMP (CANCER CENTER ONLY)
ALBUMIN: 3.8 g/dL (ref 3.5–5.0)
ALK PHOS: 77 U/L (ref 38–126)
ALT: 15 U/L (ref 0–44)
AST: 20 U/L (ref 15–41)
Anion gap: 11 (ref 5–15)
BUN: 16 mg/dL (ref 6–20)
CALCIUM: 9.1 mg/dL (ref 8.9–10.3)
CO2: 25 mmol/L (ref 22–32)
Chloride: 106 mmol/L (ref 98–111)
Creatinine: 0.82 mg/dL (ref 0.44–1.00)
GFR, Est AFR Am: >60 mL/min (ref >60)
GFR, Estimated: >60 mL/min (ref >60)
GLUCOSE: 105 mg/dL — AB (ref 70–99)
Potassium: 4.3 mmol/L (ref 3.5–5.1)
SODIUM: 142 mmol/L (ref 135–145)
TOTAL PROTEIN: 7 g/dL (ref 6.5–8.1)
Total Bilirubin: 0.4 mg/dL (ref 0.3–1.2)

## 2017-11-04 LAB — CBC WITH DIFFERENTIAL (CANCER CENTER ONLY)
ABS IMMATURE GRANULOCYTES: 0 10*3/uL (ref 0.00–0.07)
BASOS PCT: 1 %
Basophils Absolute: 0 10*3/uL (ref 0.0–0.1)
Eosinophils Absolute: 0.2 10*3/uL (ref 0.0–0.5)
Eosinophils Relative: 4 %
HEMATOCRIT: 41.6 % (ref 36.0–46.0)
Hemoglobin: 13.3 g/dL (ref 12.0–15.0)
Immature Granulocytes: 0 %
Lymphocytes Relative: 18 %
Lymphs Abs: 0.7 10*3/uL (ref 0.7–4.0)
MCH: 30.4 pg (ref 26.0–34.0)
MCHC: 32 g/dL (ref 30.0–36.0)
MCV: 95.2 fL (ref 80.0–100.0)
MONO ABS: 0.4 10*3/uL (ref 0.1–1.0)
MONOS PCT: 10 %
NEUTROS ABS: 2.8 10*3/uL (ref 1.7–7.7)
Neutrophils Relative %: 67 %
Platelet Count: 138 10*3/uL — ABNORMAL LOW (ref 150–400)
RBC: 4.37 MIL/uL (ref 3.87–5.11)
RDW: 13.2 % (ref 11.5–15.5)
WBC Count: 4.1 10*3/uL (ref 4.0–10.5)
nRBC: 0 % (ref 0.0–0.2)

## 2017-11-04 MED ORDER — OCTREOTIDE ACETATE 30 MG IM KIT
30.0000 mg | PACK | Freq: Once | INTRAMUSCULAR | Status: AC
  Administered 2017-11-04: 30 mg via INTRAMUSCULAR

## 2017-11-04 NOTE — Patient Instructions (Signed)

## 2017-11-04 NOTE — Progress Notes (Signed)
  [place] OFFICE PROGRESS NOTE   Diagnosis: Carcinoid tumor  INTERVAL HISTORY:   Brittney [name] returns as scheduled.  She completed a third cycle of Lutathera on 10/07/2017.  No nausea.  She developed malaise approximately 10 days following treatment.  Good appetite and energy level.  She is exercising.  She has irregular bowel habits with occasional diarrhea.  She continues monthly Sandostatin.  Objective:  Vital signs in last 24 hours:  Blood pressure 98/61, pulse (!) 52, temperature 97.7 F (36.5 C), temperature source Oral, resp. rate 18, height 5\' 5"  (1.651 m), weight 124 lb 8 oz (56.5 kg), last menstrual period 01/31/2014, SpO2 100 %.    Resp: Lungs clear bilaterally Cardio: Regular rate and rhythm GI: No hepatomegaly, no mass, nontender Vascular: No leg edema   Lab Results:  Lab Results  Component Value Date   WBC 4.1 11/04/2017   HGB 13.3 11/04/2017   HCT 41.6 11/04/2017   MCV 95.2 11/04/2017   PLT 138 (L) 11/04/2017   NEUTROABS 2.8 11/04/2017    CMP  Lab Results  Component Value Date   NA 142 11/04/2017   K 4.3 11/04/2017   CL 106 11/04/2017   CO2 25 11/04/2017   GLUCOSE 105 (H) 11/04/2017   BUN 16 11/04/2017   CREATININE 0.82 11/04/2017   CALCIUM 9.1 11/04/2017   PROT 7.0 11/04/2017   ALBUMIN 3.8 11/04/2017   AST 20 11/04/2017   ALT 15 11/04/2017   ALKPHOS 77 11/04/2017   BILITOT 0.4 11/04/2017   GFRNONAA >60 11/04/2017   GFRAA >60 11/04/2017     Medications: I have reviewed the patient's current medications.   Assessment/Plan: 1. Metastatic neuroendocrine tumor ? MRI of the abdomen 10/02/2016 confirmed multiple enhancing liver lesions consistent with metastases, no primary tumor site identified ? Ultrasound-guided biopsy of a left liver lesion 10/07/2016-neuroendocrine neoplasm, "intermediate grade ", review of pathology at GI tumor conference consistent with a low-grade carcinoid tumor ? Elevated chromogranin A  10/15/2016 ? gallium-DOTATATEscan 10/23/2016-multiple foci of liver metastases, enlarged central mesenteric lymph nodes, short intussusception's in adjacent small bowel felt to represent a primary small bowel tumor, additional mesenteric lymph nodes with metabolic activity, deep right pelvic nodule consistent with a pelvic metastasis ? Small bowel resection 11/21/2016-mid ileum mass, low-grade neuroendocrine tumor,pT4,pN2, 6/18 lymph nodes positive, positive mesenteric resection margin (lymph node), intramural satellite nodule ? CT 11/30/2016-small bowel obstruction with transition point adjacent to suture line at the distal small bowel, liver metastases similar to the 10/02/2016 MRI ? Recurrent obstructive symptoms requiring repeat surgery with resection of the small bowel anastomosis site 12/13/2016, pathology negative for malignancy ? Initiation of monthly Sandostatin 12/20/2016 ? CT abdomen/pelvis 02/19/2017-mild enlargement of liver lesions compared to November 2018 ? Cycle 1 Lutathera 06/17/2017 ? Cycle 2 Lutathera 08/13/2017 ? Cycle 3 Lutathera 10/07/2017  2. 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 Intermittent abdominal bloating and left-sided abdominal pain-improved  4.Admission 11/30/2016 with a small bowel obstruction      Disposition: Brittney [name] appears stable.  She will complete another treatment with Sandostatin today.  She is scheduled for a final treatment with Lutathera on 12/02/2017.  She will return for Sandostatin on 12/29/2017.  She will be scheduled for an office visit and Sandostatin on 01/30/2017.  I will communicate with Dr. Ilda Foil regarding the timing of a restaging Dotatate scan.    Brittney Coder, MD  11/04/2017  9:09 AM

## 2017-11-04 NOTE — Telephone Encounter (Signed)
Printed avs and calender of upcoming appointment. Per 10/29 los 

## 2017-11-24 DIAGNOSIS — B977 Papillomavirus as the cause of diseases classified elsewhere: Secondary | ICD-10-CM | POA: Insufficient documentation

## 2017-11-24 DIAGNOSIS — C229 Malignant neoplasm of liver, not specified as primary or secondary: Secondary | ICD-10-CM | POA: Insufficient documentation

## 2017-11-28 ENCOUNTER — Telehealth: Payer: Self-pay | Admitting: *Deleted

## 2017-11-28 NOTE — Telephone Encounter (Signed)
Notified patient that Dr. Benay Spice feels it is highly unlikely that carcinoid tumor would spread to her cervix and it is very doubtful that her radiation has caused any issue with cervix. He encourages her to follow the recommendations of GYN. She also added that GYN wants her to have a diagnostic mammogram due to fact that she has "lumpy" breasts. This RN encouraged her to follow up on that so her new GYN can have a good baseline for further comparison. She understands and agrees to do exam.

## 2017-11-28 NOTE — Telephone Encounter (Signed)
Called to obtain Dr. Gearldine Shown opinion on a recent visit to her new GYN. Reports her pap smear was normal, but she wants to perform biopsy of her cervix because "it felt hard". Patient asking for potential for carcinoid to spread to her cervix or does it predispose her to other cancers or has her radiation treatment possibly contributed to this? Reports she is "very worried".

## 2017-12-02 ENCOUNTER — Ambulatory Visit (HOSPITAL_COMMUNITY)
Admission: RE | Admit: 2017-12-02 | Discharge: 2017-12-02 | Disposition: A | Discharge status: Home / Self Care | Payer: BLUE CROSS/BLUE SHIELD | Source: Ambulatory Visit | Attending: Oncology | Admitting: Oncology

## 2017-12-02 DIAGNOSIS — C7A019 Malignant carcinoid tumor of the small intestine, unspecified portion: Secondary | ICD-10-CM | POA: Diagnosis not present

## 2017-12-02 LAB — COMPREHENSIVE METABOLIC PANEL
ALT: 15 U/L (ref 0–44)
ANION GAP: 10 (ref 5–15)
AST: 26 U/L (ref 15–41)
Albumin: 4.3 g/dL (ref 3.5–5.0)
Alkaline Phosphatase: 56 U/L (ref 38–126)
BILIRUBIN TOTAL: 0.8 mg/dL (ref 0.3–1.2)
BUN: 18 mg/dL (ref 6–20)
CO2: 25 mmol/L (ref 22–32)
CREATININE: 0.72 mg/dL (ref 0.44–1.00)
Calcium: 9.1 mg/dL (ref 8.9–10.3)
Chloride: 108 mmol/L (ref 98–111)
Glucose, Bld: 100 mg/dL — ABNORMAL HIGH (ref 70–99)
Potassium: 3.8 mmol/L (ref 3.5–5.1)
Sodium: 143 mmol/L (ref 135–145)
TOTAL PROTEIN: 7.1 g/dL (ref 6.5–8.1)

## 2017-12-02 LAB — CBC WITH DIFFERENTIAL/PLATELET
Abs Immature Granulocytes: 0.01 10*3/uL (ref 0.00–0.07)
Basophils Absolute: 0 10*3/uL (ref 0.0–0.1)
Basophils Relative: 1 %
EOS PCT: 5 %
Eosinophils Absolute: 0.2 10*3/uL (ref 0.0–0.5)
HEMATOCRIT: 41.3 % (ref 36.0–46.0)
HEMOGLOBIN: 13.2 g/dL (ref 12.0–15.0)
Immature Granulocytes: 0 %
LYMPHS PCT: 29 %
Lymphs Abs: 0.9 10*3/uL (ref 0.7–4.0)
MCH: 30.8 pg (ref 26.0–34.0)
MCHC: 32 g/dL (ref 30.0–36.0)
MCV: 96.3 fL (ref 80.0–100.0)
MONO ABS: 0.3 10*3/uL (ref 0.1–1.0)
MONOS PCT: 11 %
Neutro Abs: 1.7 10*3/uL (ref 1.7–7.7)
Neutrophils Relative %: 54 %
Platelets: 162 10*3/uL (ref 150–400)
RBC: 4.29 MIL/uL (ref 3.87–5.11)
RDW: 13.2 % (ref 11.5–15.5)
WBC: 3.2 10*3/uL — ABNORMAL LOW (ref 4.0–10.5)
nRBC: 0 % (ref 0.0–0.2)

## 2017-12-02 MED ORDER — SODIUM CHLORIDE 0.9 % IV SOLN
8.0000 mg | Freq: Once | INTRAVENOUS | Status: AC
  Administered 2017-12-02: 8 mg via INTRAVENOUS
  Filled 2017-12-02: qty 4

## 2017-12-02 MED ORDER — OCTREOTIDE ACETATE 500 MCG/ML IJ SOLN
INTRAMUSCULAR | Status: AC
  Filled 2017-12-02: qty 1

## 2017-12-02 MED ORDER — OCTREOTIDE ACETATE 30 MG IM KIT
PACK | INTRAMUSCULAR | Status: AC
  Filled 2017-12-02: qty 1

## 2017-12-02 MED ORDER — ONDANSETRON HCL 8 MG PO TABS: 8.0000 mg | ORAL_TABLET | Freq: Two times a day (BID) | ORAL | 0 refills | Status: DC | PRN

## 2017-12-02 MED ORDER — SODIUM CHLORIDE 0.9 % IV SOLN: 500.0000 mL | Freq: Once | INTRAVENOUS | Status: DC

## 2017-12-02 MED ORDER — OCTREOTIDE ACETATE 30 MG IM KIT
30.0000 mg | PACK | Freq: Once | INTRAMUSCULAR | Status: AC
  Administered 2017-12-02: 30 mg via INTRAMUSCULAR

## 2017-12-02 MED ORDER — PROCHLORPERAZINE EDISYLATE 10 MG/2ML IJ SOLN
10.0000 mg | Freq: Four times a day (QID) | INTRAMUSCULAR | Status: DC | PRN
  Filled 2017-12-02 (×2): qty 2

## 2017-12-02 MED ORDER — OCTREOTIDE ACETATE 500 MCG/ML IJ SOLN: 500.0000 ug | Freq: Once | INTRAMUSCULAR | Status: DC | PRN

## 2017-12-02 MED ORDER — AMINO ACID RADIOPROTECTANT - L-LYSINE 2.5%/L-ARGININE 2.5% IN NS
250.0000 mL/h | INTRAVENOUS | Status: AC
  Administered 2017-12-02: 250 mL/h via INTRAVENOUS
  Filled 2017-12-02: qty 1000

## 2017-12-02 MED ORDER — LUTETIUM LU 177 DOTATATE 370 MBQ/ML IV SOLN
200.0000 | Freq: Once | INTRAVENOUS | Status: AC
  Administered 2017-12-02: 203 via INTRAVENOUS

## 2017-12-29 ENCOUNTER — Inpatient Hospital Stay: Payer: BLUE CROSS/BLUE SHIELD | Attending: Oncology

## 2017-12-29 ENCOUNTER — Ambulatory Visit (HOSPITAL_COMMUNITY)
Admission: RE | Admit: 2017-12-29 | Discharge: 2017-12-29 | Disposition: A | Discharge status: Home / Self Care | Payer: BLUE CROSS/BLUE SHIELD | Source: Ambulatory Visit | Attending: Oncology | Admitting: Oncology

## 2017-12-29 DIAGNOSIS — C7A8 Other malignant neuroendocrine tumors: Secondary | ICD-10-CM | POA: Diagnosis not present

## 2017-12-29 DIAGNOSIS — D3A019 Benign carcinoid tumor of the small intestine, unspecified portion: Secondary | ICD-10-CM | POA: Diagnosis present

## 2017-12-29 DIAGNOSIS — C7A019 Malignant carcinoid tumor of the small intestine, unspecified portion: Secondary | ICD-10-CM

## 2017-12-29 DIAGNOSIS — Z79899 Other long term (current) drug therapy: Secondary | ICD-10-CM | POA: Diagnosis not present

## 2017-12-29 MED ORDER — GALLIUM GA 68 DOTATATE IV KIT
3.2000 | PACK | Freq: Once | INTRAVENOUS | Status: AC
  Administered 2017-12-29: 3.2 via INTRAVENOUS

## 2017-12-29 MED ORDER — OCTREOTIDE ACETATE 30 MG IM KIT
PACK | INTRAMUSCULAR | Status: AC
  Filled 2017-12-29: qty 1

## 2017-12-29 MED ORDER — OCTREOTIDE ACETATE 30 MG IM KIT
30.0000 mg | PACK | Freq: Once | INTRAMUSCULAR | Status: AC
  Administered 2017-12-29: 30 mg via INTRAMUSCULAR

## 2017-12-29 NOTE — Patient Instructions (Signed)

## 2017-12-29 NOTE — Consult Note (Signed)
Chief Complaint: Patient with metastatic neuroendocrine tumor post completion of 4 cycles Peptide receptor radiotherapy (PRRT) with ZO109 DOTATATE (Lutathera). Therapy complete 11/22/17  Referring Physician(s):Sherrill   [name]  History of Present Illness: 52 year old female who was in normal state of health when she noted abdominal bloating and distention.  Ultrasound and ultimately CT exams revealed liver lesions which upon biopsy or confirmed well differentiated neuroendocrine tumor. Subsequent Ga 68 DOTATATE PET scan demonstrated malignant DOTATATE avid lymph nodes in the retroperitoneum, liver metastasis, and a lesion in the small bowel causing an intussusception. Patient subsequently underwent small bowel resection for small bowel obstruction which revealed a small bowel neuroendocrine tumor at this site intussusception.    Currently underwent abdominal surgery 11/21/2016 for primary tumor resection (small bowel) and removal of mesenteric nodes..    Of note patient does not demonstrate carcinoid symptoms denying flushing, diarrhea, GI upset.  Chromagranin A  is mildly elevated at 10.  This decreased to 2 with treatment.  Past Medical History:  Diagnosis Date  . 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 Disorder of sulfur-bearing amino-acid metabolism (HCC)    pateint denies  . Family history of sudden cardiac death    patient has two cousins that died from MI  . Heart palpitations    medication induced about 2 years has not had any problem since  . Iron deficiency anemia   . Magnesium deficiency    patient denies  . 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 Mixed hyperlipidemia   . Premenstrual tension syndrome    denies  . Vitamin D deficiency     Past Surgical History:  Procedure Laterality Date  . BOWEL  RESECTION N/A 11/21/2016   Procedure: LAPAROSCOPIC SMALL BOWEL RESECTION;  Surgeon: [name], MD;  Location: [facility];  Service: General;  Laterality: N/A;  . BOWEL RESECTION N/A 12/13/2016   Procedure: SMALL BOWEL RESECTION;  Surgeon: [name], MD;  Location: [facility];  Service: General;  Laterality: N/A;  . BREAST SURGERY  2005   lumphectomy begin  . CESAREAN SECTION    . COLON SURGERY     small intestine surgery   . COLONOSCOPY    . IR RADIOLOGIST EVAL & MGMT  12/04/2016  . LAPAROSCOPY N/A 12/13/2016   Procedure: LAPAROSCOPY DIAGNOSTIC;  Surgeon: [name], MD;  Location: [facility];  Service: General;  Laterality: N/A;  . WISDOM TOOTH EXTRACTION      Allergies: Other; Peanut-containing drug products; Oxycodone-acetaminophen; Metronidazole; Tramadol; Trilyte [peg 3350-kcl-na bicarb-nacl]; Penicillins; and Percocet [oxycodone-acetaminophen]  Medications: Prior to Admission medications   Medication Sig Start Date End Date Taking? Authorizing Provider  ibuprofen (ADVIL,MOTRIN) 200 MG tablet Take 400 mg by mouth every 6 (six) hours as needed for moderate pain.     [provider]  Nutritional Supplements (JUICE PLUS FIBRE PO) Take 2 each by mouth daily. [employer], Historical, MD  Nutritional Supplements (JUICE PLUS FIBRE PO) Take 2 capsules by mouth daily. [employer], Historical, MD  Nutritional Supplements (JUICE PLUS FIBRE PO) Take 2 each by mouth daily. [facility]    [provider]  Omega-3 1000 MG CAPS Take by mouth.    [provider]  ondansetron (ZOFRAN) 8 MG tablet Take 1 tablet (8 mg total) by mouth 2 (two)  times daily as needed for nausea or vomiting. 10/07/17   Gus Height, MD  ondansetron (ZOFRAN) 8 MG tablet Take 1 tablet (8 mg total) by mouth 2 (two) times daily as needed for nausea or vomiting. 12/02/17   Kerby Moors, MD  polyethylene glycol [facility] / Floria Raveling) packet Take 17 g by mouth every other day.     [provider]  Psyllium (METAMUCIL) 0.36 g CAPS Take 2 capsules by mouth 2 (two) times daily at 8 am and 10 pm.    [provider]     Family History  Problem Relation Age of Onset  . Sudden Cardiac Death Cousin 30       Female (maternal)  . Sudden Cardiac Death Cousin 59       Female (paternal)   . Cancer Mother        colon  . Cancer Father        lung    Social History   Socioeconomic History  . Marital status: Married    Spouse name: Not on file  . Number of children: 2  . Years of education: Not on file  . Highest education level: Not on file  Occupational History  . Occupation: Armed forces operational officer  Social Needs  . Financial resource strain: Not on file  . Food insecurity:    Worry: Not on file    Inability: Not on file  . Transportation needs:    Medical: Not on file    Non-medical: Not on file  Tobacco Use  . Smoking status: Never Smoker  . Smokeless tobacco: Never Used  Substance and Sexual Activity  . Alcohol use: Yes    Comment: social/ 1 glass wine daily  . Drug use: No  . Sexual activity: Not on file  Lifestyle  . Physical activity:    Days per week: Not on file    Minutes per session: Not on file  . Stress: Not on file  Relationships  . Social connections:    Talks on phone: Not on file    Gets together: Not on file    Attends religious service: Not on file    Active member of club or organization: Not on file    Attends meetings of clubs or organizations: Not on file    Relationship status: Not on file  Other Topics Concern  . Not on file  Social History Narrative   Lives with husband and one child.     ECOG Status: 1 - Symptomatic but completely ambulatory  Review of Systems: A 12 point ROS discussed and pertinent positives are indicated in the HPI above.  All other systems are negative.  Review of Systems  Vital Signs: LMP 01/31/2014   Physical Exam  Imaging: Nm Pet (netspot Ga 67 Dotatate) Skull Base To Mid  Thigh  Result Date: 12/29/2017 CLINICAL DATA:  Well differentiated neuroendocrine tumor of the small bowel with liver metastasis. Patient status post 4 cycles peptide receptor radiotherapy (LU 177 DOTATATE). Last therapy 10/07/2017) EXAM: NUCLEAR MEDICINE PET SKULL BASE TO THIGH TECHNIQUE: 3.2 mCi Ga 75 DOTATATE was injected intravenously. Full-ring PET imaging was performed from the skull base to thigh after the radiotracer. CT data was obtained and used for attenuation correction and anatomic localization. COMPARISON:  DOTATATE PET scan 10/23/2016 FINDINGS: NECK No radiotracer activity in neck lymph nodes. Incidental CT findings: None CHEST No abnormal or enlarged mediastinal lymph nodes. No suspicious pulmonary nodules. Incidental CT finding:None ABDOMEN/PELVIS Rounded lesions in LEFT and RIGHT hepatic  lobe have persistent intense radiotracer activity. Individual lesions have decreased in size compared to most recent CT scan of 02/19/2017. For example: Lesion 1: Superior RIGHT hepatic lobe measuring 2.8 x 3.0 cm (image 93/4) decreased from 3.7 x 3.4 cm. Lesion 2: Slightly more inferior lesion in the RIGHT hepatic lobe measures 3.4 x 3.2 cm (image 98/4) compared to 3.6 x 3.4 cm. Lesion 3: Lateral LEFT hepatic lobe measures 1.6 x 1.9 cm (image 105/4) compared to 2.1 x 2.7 cm. Assessing the radiotracer activity lesion remains densely radiotracer avid. The background liver activity and splenic activity is similar between the 2 scans making direct SUV measured comparison valid. Lesion 1: superior RIGHT hepatic lobe with SUV max equal 15.9 compares to SUV max equal 11.5. Lesion 2: more inferior RIGHT hepatic lobe with SUV max equal 16.8 compared SUV max equal 11.5. Lesion 3: Lateral LEFT hepatic lobe SUV max equal 23.4 compared to SUV max equal 16.5. Importantly, there are no new lesions within liver. Interval resection of the of mesenteric masses in the upper pelvis as well as the small-bowel lesion. There is no  residual or recurrent radiotracer bowel or mesentery. Within the deep RIGHT pelvis small focus of activity with SUV max equal 8.8 similar to 8.1 on comparison exam. No clear lesion on the CT portion exam. There is small amount of free fluid in the pelvis similar to prior. Physiologic activity noted in the liver, spleen, adrenal glands and kidneys. Incidental CT findings:Postsurgical change consistent bowel resection. IUD expected location. SKELETON No focal activity to suggest skeletal metastasis. Incidental CT findings:None IMPRESSION: 1. No evidence of new or metastatic disease or progression. 2. Again demonstrated multiple hepatic metastasis. Lesions have decreased in size from most recent CT scan (02/19/2017) and slightly increased in radiotracer activity from [facility] PET scan 10/23/2016. 3. Interval resection of the small bowel tumor and bulky mesenteric metastasis. No residual activity in the bowel or mesentery. 4. Single small focus of activity in the deep RIGHT pelvis along the peritoneal surface not changed from comparison exam. This could represent small stable peritoneal implant. Recommend attention on follow-up. Electronically Signed   By: Suzy Bouchard M.D.   On: 12/29/2017 10:11     Labs:  CBC: Recent Labs    09/11/17 0840 10/07/17 0734 11/04/17 0725 12/02/17 0750  WBC 3.3* 3.2* 4.1 3.2*  HGB 14.0 12.8 13.3 13.2  HCT 43.0 38.9 41.6 41.3  PLT 153 232 138* 162    COAGS: No results for input(s): INR, APTT in the last 8760 hours.  BMP: Recent Labs    09/11/17 0840 10/07/17 0734 11/04/17 0725 12/02/17 0750  NA 143 140 142 143  K 4.1 4.1 4.3 3.8  CL 107 104 106 108  CO2 25 27 25 25   GLUCOSE 51* 139* 105* 100*  BUN 19 19 16 18   CALCIUM 9.3 9.1 9.1 9.1  CREATININE 0.79 0.78 0.82 0.72  GFRNONAA >60 >60 >60 >60  GFRAA >60 >60 >60 >60    LIVER FUNCTION TESTS: Recent Labs    09/11/17 0840 10/07/17 0734 11/04/17 0725 12/02/17 0750  BILITOT 0.5 0.8 0.4 0.8  AST 17  24 20 26   ALT 17 16 15 15   ALKPHOS 72 57 77 56  PROT 7.1 6.4* 7.0 7.1  ALBUMIN 3.9 3.6 3.8 4.3    TUMOR MARKERS: Chromogranin A  = 2 on 06/17/17  Assessment and Plan:  Brittney [name] and her husband present for follow-up consult following completion peptide receptor radiotherapy (LU 177 DOTATATE).  Patienthas completed 4 cycles with the  last cycle on 12/02/2017].  Patient did experience some nausea  about 8 days after the last treatment.  Patient also reports adverse reaction to antibiotic which has since resolved.  Otherwise patient is in otherwise extremely good physical and mental health.   The patient reports only minimal GI symptoms which may be attributed to small bowel surgery.   Follow-up gallium 68 DOTATATE PET scan on 12/29/2017 demonstrate reduction in size of metastatic lesions within the liver with some mild increase in activity. This is overall favored to be a positive response.  No new metastatic lesions in liver.  No evidence of residual disease in the bowel or mesentery following primary tumor section and removal of bulky mesenteric metastasis.   Recommend continue following chromogranin A although noncontributory currently at 2.  Recommend follow-up DOTATATE PET-CT or contrast CT scan in 6 months to assess stability.  Continued radiotoxicity to tumor has been observed for months following completion of PRRT therapy.   Thank you for the consult and I  enjoyed participating in the care of Mrs Lovett.  Please do not hesitate to contact with further questions or concerns.     Electronically Signed: Rennis Golden, MD   Nicole Kindred)  12/29/2017, 11:05 AM   I spent a total of    40 Minutes in face to face in clinical consultation, greater than 50% of which was counseling/coordinating care for metastatic neuroendocrine tumor.

## 2018-01-13 ENCOUNTER — Ambulatory Visit (HOSPITAL_COMMUNITY): Payer: BLUE CROSS/BLUE SHIELD

## 2018-01-20 ENCOUNTER — Telehealth: Payer: Self-pay | Admitting: *Deleted

## 2018-01-20 NOTE — Telephone Encounter (Signed)
Sent email through [facility] system to nurse navigator reporting swelling and pain in lymph nodes on neck and behind ear. Wanted MD aware. Attempted to call back and had to leave voice mail requesting a return call with more information: when did swollen nodes appear, size, movable, painful, any recent infections, fever.

## 2018-01-30 ENCOUNTER — Inpatient Hospital Stay: Payer: BLUE CROSS/BLUE SHIELD | Attending: Oncology | Admitting: Nurse Practitioner

## 2018-01-30 ENCOUNTER — Inpatient Hospital Stay: Payer: BLUE CROSS/BLUE SHIELD

## 2018-01-30 ENCOUNTER — Telehealth: Payer: Self-pay | Admitting: Oncology

## 2018-01-30 ENCOUNTER — Encounter: Payer: Self-pay | Admitting: Nurse Practitioner

## 2018-01-30 VITALS — BP 94/67 | HR 51 | Temp 98.0°F | Resp 18 | Ht 65.0 in | Wt 127.1 lb

## 2018-01-30 DIAGNOSIS — C7A8 Other malignant neuroendocrine tumors: Secondary | ICD-10-CM

## 2018-01-30 DIAGNOSIS — C7A019 Malignant carcinoid tumor of the small intestine, unspecified portion: Secondary | ICD-10-CM

## 2018-01-30 DIAGNOSIS — C7B02 Secondary carcinoid tumors of liver: Secondary | ICD-10-CM | POA: Diagnosis not present

## 2018-01-30 DIAGNOSIS — R590 Localized enlarged lymph nodes: Secondary | ICD-10-CM | POA: Insufficient documentation

## 2018-01-30 DIAGNOSIS — E340 Carcinoid syndrome: Secondary | ICD-10-CM

## 2018-01-30 MED ORDER — OCTREOTIDE ACETATE 30 MG IM KIT
30.0000 mg | PACK | Freq: Once | INTRAMUSCULAR | Status: AC
  Administered 2018-01-30: 30 mg via INTRAMUSCULAR

## 2018-01-30 MED ORDER — OCTREOTIDE ACETATE 30 MG IM KIT
PACK | INTRAMUSCULAR | Status: AC
  Filled 2018-01-30: qty 1

## 2018-01-30 NOTE — Telephone Encounter (Signed)
Gave avs and calendar. Per dr Benay Spice ok for a 3-4 day leadway for injections

## 2018-01-30 NOTE — Progress Notes (Addendum)
[place] OFFICE PROGRESS NOTE   Diagnosis: Carcinoid tumor  INTERVAL HISTORY:   Ms. Hassebrock returns as scheduled.  Overall she has been feeling well.  She had "digestive issues" yesterday.  She felt bloated, full.  She had several episodes of diarrhea.  Feels better this morning.  She wonders if symptoms were related to eating some raw broccoli.  Typical bowel habits are 4-5 stools a day, not watery.  She denies nausea/vomiting.  No fever.  No significant flushing episodes.  She has a rash at the left abdomen which "comes and goes".  It is pruritic.  She reports evaluation by dermatology.  She recently developed bilateral ear pain and noted some enlarged cervical lymph nodes.  She completed a course of antibiotics.  The lymph nodes have decreased in size.  Objective:  Vital signs in last 24 hours:  Blood pressure 94/67, pulse (!) 51, temperature 98 F (36.7 C), temperature source Oral, resp. rate 18, height 5\' 5"  (1.651 m), weight 127 lb 1.6 oz (57.7 kg), last menstrual period 01/31/2014, SpO2 100 %.    HEENT: No thrush or ulcers. Lymphatics: Tiny palpable right scalene and left posterior cervical lymph nodes. Resp: Lungs clear bilaterally. Cardio: Regular rate and rhythm. GI: Abdomen soft and nontender.  No hepatomegaly.  No mass. Vascular: No leg edema.  Skin: Erythematous rash left abdomen.   Lab Results:  Lab Results  Component Value Date   WBC 3.2 (L) 12/02/2017   HGB 13.2 12/02/2017   HCT 41.3 12/02/2017   MCV 96.3 12/02/2017   PLT 162 12/02/2017   NEUTROABS 1.7 12/02/2017    Imaging:  No results found.  Medications: I have reviewed the patient's current medications.  Assessment/Plan: 1. Metastatic neuroendocrine tumor ? MRI of the abdomen 10/02/2016 confirmed multiple enhancing liver lesions consistent with metastases, no primary tumor site identified ? Ultrasound-guided biopsy of a left liver lesion 10/07/2016-neuroendocrine neoplasm,  "intermediate grade ", review of pathology at GI tumor conference consistent with a low-grade carcinoid tumor ? Elevated chromogranin A 10/15/2016 ? gallium-DOTATATEscan 10/23/2016-multiple foci of liver metastases, enlarged central mesenteric lymph nodes, short intussusception's in adjacent small bowel felt to represent a primary small bowel tumor, additional mesenteric lymph nodes with metabolic activity, deep right pelvic nodule consistent with a pelvic metastasis ? Small bowel resection 11/21/2016-mid ileum mass, low-grade neuroendocrine tumor,pT4,pN2, 6/18 lymph nodes positive, positive mesenteric resection margin (lymph node), intramural satellite nodule ? CT 11/30/2016-small bowel obstruction with transition point adjacent to suture line at the distal small bowel, liver metastases similar to the 10/02/2016 MRI ? Recurrent obstructive symptoms requiring repeat surgery with resection of the small bowel anastomosis site 12/13/2016, pathology negative for malignancy ? Initiation of monthly Sandostatin 12/20/2016 ? CT abdomen/pelvis 02/19/2017-mild enlargement of liver lesions compared to November 2018 ? Cycle 1 Lutathera 06/17/2017 ? Cycle 2 Lutathera 08/13/2017 ? Cycle 3 Lutathera 10/07/2017 ? Cycle 4 Lutathera 12/02/2017 ? Dotatate scan 12/29/2017-no evidence of new or metastatic disease or progression.  Multiple hepatic metastasis.  Lesions have decreased in size from the most recent CT scan and slightly increased in radiotracer activity from dotatate PET scan 10/23/2016.  Interval resection of small bowel tumor and bulky mesenteric metastasis.  No residual activity in the bowel or mesentery.  Single small focus of activity in the deep right pelvis along the peritoneal surface not changed from comparison exam.  2. 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 Intermittent abdominal bloating and left-sided abdominal  pain-improved  4.Admission 11/30/2016 with a small bowel obstruction   Disposition: Ms. Krist appears stable.  She has completed the course of Lutathera.  The follow-up dotatate scan indicates a positive response.  Liver lesions were smaller and there was no evidence of progression.  Plan to continue monthly Sandostatin.  Plan for restaging CTs at a 33-month interval from the December dotatate scan.  We will repeat a chromogranin level in 3 months.  We discussed the abdominal symptoms she was experiencing yesterday.  She understands the symptoms may be due to intermittent obstruction.  She is better today.  She understands to contact the office if symptoms persist despite modifying her diet or she develops nausea/vomiting.  She will return for Sandostatin in 1 month.  We will see her in follow-up in 3 months.  She will contact the office in the interim as outlined above or with any other problems.  Patient seen with Dr. Benay Spice.  25 minutes were spent face-to-face at today's visit with the majority of that time involved in counseling/coordination of care.    Ned Card ANP/GNP-BC   01/30/2018  9:40 AM  This was a shared visit with Ned Card.  Ms. Bloxom was interviewed and examined.  The small neck nodes are likely a benign chronic finding or related to a recent upper respiratory infection. We discussed the prognosis, surveillance plan, and treatment options.  She will return for an office visit in 3 months.  Julieanne Manson, MD

## 2018-02-26 ENCOUNTER — Ambulatory Visit: Payer: Self-pay

## 2018-03-03 ENCOUNTER — Inpatient Hospital Stay: Payer: BLUE CROSS/BLUE SHIELD | Attending: Oncology

## 2018-03-03 VITALS — BP 93/62 | HR 54 | Temp 98.3°F | Resp 16

## 2018-03-03 DIAGNOSIS — C7A019 Malignant carcinoid tumor of the small intestine, unspecified portion: Secondary | ICD-10-CM

## 2018-03-03 DIAGNOSIS — Z79899 Other long term (current) drug therapy: Secondary | ICD-10-CM | POA: Diagnosis not present

## 2018-03-03 DIAGNOSIS — C7A8 Other malignant neuroendocrine tumors: Secondary | ICD-10-CM | POA: Diagnosis not present

## 2018-03-03 DIAGNOSIS — E340 Carcinoid syndrome: Secondary | ICD-10-CM | POA: Diagnosis not present

## 2018-03-03 MED ORDER — OCTREOTIDE ACETATE 30 MG IM KIT
30.0000 mg | PACK | Freq: Once | INTRAMUSCULAR | Status: AC
  Administered 2018-03-03: 30 mg via INTRAMUSCULAR

## 2018-03-03 MED ORDER — OCTREOTIDE ACETATE 30 MG IM KIT
PACK | INTRAMUSCULAR | Status: AC
  Filled 2018-03-03: qty 1

## 2018-03-03 NOTE — Patient Instructions (Signed)

## 2018-03-27 ENCOUNTER — Telehealth: Payer: Self-pay | Admitting: Oncology

## 2018-03-27 NOTE — Telephone Encounter (Signed)
Returned call re rescheduled 3/24 injection. Confirmed with patient injection for 3/23.

## 2018-03-30 ENCOUNTER — Other Ambulatory Visit: Payer: Self-pay

## 2018-03-30 ENCOUNTER — Inpatient Hospital Stay: Payer: BLUE CROSS/BLUE SHIELD | Attending: Oncology

## 2018-03-30 VITALS — BP 111/63

## 2018-03-30 DIAGNOSIS — E340 Carcinoid syndrome: Secondary | ICD-10-CM | POA: Insufficient documentation

## 2018-03-30 DIAGNOSIS — C7A019 Malignant carcinoid tumor of the small intestine, unspecified portion: Secondary | ICD-10-CM

## 2018-03-30 DIAGNOSIS — C7A8 Other malignant neuroendocrine tumors: Secondary | ICD-10-CM | POA: Insufficient documentation

## 2018-03-30 DIAGNOSIS — Z79899 Other long term (current) drug therapy: Secondary | ICD-10-CM | POA: Insufficient documentation

## 2018-03-30 MED ORDER — OCTREOTIDE ACETATE 30 MG IM KIT
30.0000 mg | PACK | Freq: Once | INTRAMUSCULAR | Status: AC
  Administered 2018-03-30: 30 mg via INTRAMUSCULAR

## 2018-03-30 MED ORDER — OCTREOTIDE ACETATE 30 MG IM KIT
PACK | INTRAMUSCULAR | Status: AC
  Filled 2018-03-30: qty 1

## 2018-03-30 NOTE — Patient Instructions (Signed)

## 2018-03-31 ENCOUNTER — Inpatient Hospital Stay: Payer: BLUE CROSS/BLUE SHIELD

## 2018-04-21 ENCOUNTER — Telehealth: Payer: Self-pay | Admitting: *Deleted

## 2018-04-21 NOTE — Telephone Encounter (Addendum)
Called patient to reschedule her 4/21 appointments out 1 month per Dr. Benay Spice. Patient declines to make change since she is feeling better now than she has in a long time. Asking if delaying her lab/OV to May would impact the timing of her CT scan due in June?  Called her back on 04/22/18 and informed her Dr. Benay Spice said skipping April will not impact her CT scan in June. She has decided to keep her appointments as scheduled on 04/28/18.

## 2018-04-28 ENCOUNTER — Other Ambulatory Visit: Payer: Self-pay

## 2018-04-28 ENCOUNTER — Inpatient Hospital Stay: Payer: BLUE CROSS/BLUE SHIELD

## 2018-04-28 ENCOUNTER — Inpatient Hospital Stay: Payer: BLUE CROSS/BLUE SHIELD | Attending: Oncology

## 2018-04-28 ENCOUNTER — Inpatient Hospital Stay (HOSPITAL_BASED_OUTPATIENT_CLINIC_OR_DEPARTMENT_OTHER): Payer: BLUE CROSS/BLUE SHIELD | Admitting: Oncology

## 2018-04-28 VITALS — BP 115/72 | HR 83 | Temp 97.8°F | Resp 18 | Ht 65.0 in | Wt 126.3 lb

## 2018-04-28 DIAGNOSIS — C7A8 Other malignant neuroendocrine tumors: Secondary | ICD-10-CM

## 2018-04-28 DIAGNOSIS — E340 Carcinoid syndrome: Secondary | ICD-10-CM | POA: Insufficient documentation

## 2018-04-28 DIAGNOSIS — C7A019 Malignant carcinoid tumor of the small intestine, unspecified portion: Secondary | ICD-10-CM

## 2018-04-28 DIAGNOSIS — Z79899 Other long term (current) drug therapy: Secondary | ICD-10-CM

## 2018-04-28 MED ORDER — OCTREOTIDE ACETATE 30 MG IM KIT
30.0000 mg | PACK | Freq: Once | INTRAMUSCULAR | Status: AC
  Administered 2018-04-28: 13:00:00 30 mg via INTRAMUSCULAR

## 2018-04-28 MED ORDER — OCTREOTIDE ACETATE 30 MG IM KIT
PACK | INTRAMUSCULAR | Status: AC
  Filled 2018-04-28: qty 1

## 2018-04-28 NOTE — Progress Notes (Signed)
[place] OFFICE PROGRESS NOTE   Diagnosis: Carcinoid tumor  INTERVAL HISTORY:   Ms. Ventola returns as scheduled.  She continues monthly Sandostatin.  She feels well.  She is exercising.  No nausea or vomiting.  She has approximately 4 bowel movements per day.  She takes Metamucil daily. She reports an episode of bright red blood per rectum during a bowel movement last weekend and again this morning.  Blood was noted in the toilet and on the toilet tissue.  This occurred once before within the past few years.  No other bleeding.  She has noted a "knot" at the Sandostatin injection site from last month.  This is been present for the past week.  Objective:  Vital signs in last 24 hours:  Blood pressure 115/72, pulse 83, temperature 97.8 F (36.6 C), temperature source Oral, resp. rate 18, height 5\' 5"  (1.651 m), weight 126 lb 4.8 oz (57.3 kg), last menstrual period 01/31/2014, SpO2 100 %.     GI: No hepatomegaly, no mass, nontender Rectal: Small hemorrhoids at the inferior anal verge, no rectal mass.  No bleeding.  No stool in the rectal vault. Vascular: No leg edema Musculoskeletal: 2 cm area of nodular subcutaneous thickening at the left upper buttock with an apparent small resolving overlying ecchymosis.   Lab Results:  Lab Results  Component Value Date   WBC 3.2 (L) 12/02/2017   HGB 13.2 12/02/2017   HCT 41.3 12/02/2017   MCV 96.3 12/02/2017   PLT 162 12/02/2017   NEUTROABS 1.7 12/02/2017    CMP  Lab Results  Component Value Date   NA 143 12/02/2017   K 3.8 12/02/2017   CL 108 12/02/2017   CO2 25 12/02/2017   GLUCOSE 100 (H) 12/02/2017   BUN 18 12/02/2017   CREATININE 0.72 12/02/2017   CALCIUM 9.1 12/02/2017   PROT 7.1 12/02/2017   ALBUMIN 4.3 12/02/2017   AST 26 12/02/2017   ALT 15 12/02/2017   ALKPHOS 56 12/02/2017   BILITOT 0.8 12/02/2017   GFRNONAA >60 12/02/2017   GFRAA >60 12/02/2017     Medications: I have reviewed the patient's  current medications.   Assessment/Plan: 1. Metastatic neuroendocrine tumor ? MRI of the abdomen 10/02/2016 confirmed multiple enhancing liver lesions consistent with metastases, no primary tumor site identified ? Ultrasound-guided biopsy of a left liver lesion 10/07/2016-neuroendocrine neoplasm, "intermediate grade ", review of pathology at GI tumor conference consistent with a low-grade carcinoid tumor ? Elevated chromogranin A 10/15/2016 ? gallium-DOTATATEscan 10/23/2016-multiple foci of liver metastases, enlarged central mesenteric lymph nodes, short intussusception's in adjacent small bowel felt to represent a primary small bowel tumor, additional mesenteric lymph nodes with metabolic activity, deep right pelvic nodule consistent with a pelvic metastasis ? Small bowel resection 11/21/2016-mid ileum mass, low-grade neuroendocrine tumor,pT4,pN2, 6/18 lymph nodes positive, positive mesenteric resection margin (lymph node), intramural satellite nodule ? CT 11/30/2016-small bowel obstruction with transition point adjacent to suture line at the distal small bowel, liver metastases similar to the 10/02/2016 MRI ? Recurrent obstructive symptoms requiring repeat surgery with resection of the small bowel anastomosis site 12/13/2016, pathology negative for malignancy ? Initiation of monthly Sandostatin 12/20/2016 ? CT abdomen/pelvis 02/19/2017-mild enlargement of liver lesions compared to November 2018 ? Cycle 1 Lutathera 06/17/2017 ? Cycle 2 Lutathera 08/13/2017 ? Cycle 3 Lutathera 10/07/2017 ? Cycle 4 Lutathera 12/02/2017 ? Dotatate scan 12/29/2017-no evidence of new or metastatic disease or progression.  Multiple hepatic metastasis.  Lesions have decreased in size from the most recent  CT scan and slightly increased in radiotracer activity from dotatate PET scan 10/23/2016.  Interval resection of small bowel tumor and bulky mesenteric metastasis.  No residual activity in the bowel or mesentery.  Single  small focus of activity in the deep right pelvis along the peritoneal surface not changed from comparison exam. ? Monthly Sandostatin continue  2. 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 Intermittent abdominal bloating and left-sided abdominal pain- resolved  4.Admission 11/30/2016 with a small bowel obstruction   Disposition: Ms. Perfetti appears stable.  There is no clinical evidence for progression of the carcinoid tumor.  She continues monthly Sandostatin.  I will discuss the indication for restaging a restaging CT or Netspot with Dr. Ilda Foil.  She reports undergoing a negative colonoscopy approximately 6 years ago.  She has a family history of colon cancer.  I recommended she contact Dr. Michail Sermon to discuss the indication for a colonoscopy given the recent rectal bleeding.  I suspect the bleeding is secondary to hemorrhoid bleeding.  She will receive Sandostatin today and again in 1 month.  She will be scheduled for a 19-month office visit.  [name], MD  04/28/2018  12:57 PM

## 2018-04-29 ENCOUNTER — Telehealth: Payer: Self-pay | Admitting: *Deleted

## 2018-04-29 ENCOUNTER — Other Ambulatory Visit: Payer: Self-pay | Admitting: Oncology

## 2018-04-29 ENCOUNTER — Telehealth: Payer: Self-pay | Admitting: Oncology

## 2018-04-29 DIAGNOSIS — C7A019 Malignant carcinoid tumor of the small intestine, unspecified portion: Secondary | ICD-10-CM

## 2018-04-29 LAB — CHROMOGRANIN A: Chromogranin A (ng/mL): 81.3 ng/mL (ref 0.0–101.8)

## 2018-04-29 NOTE — Telephone Encounter (Signed)
Left VM requesting her chromogranin A results. Noted that lab did not perform the Chromogranin A Rebase line test as was needed due to change in lab testing. Called lab to inquire if this can be added to this sample. Will follow up with [facility] and call if not able to do so.

## 2018-04-29 NOTE — Telephone Encounter (Signed)
Notified patient of Dr. Gearldine Shown discussion with Dr. Ilda Foil. He states she needs the NETSPOT PET scan. Patient would like to have it in June the same day as her SandoLAR injection (will need to be done after the scan). Sent message to managed care to start working on the PA with insurance.

## 2018-04-29 NOTE — Telephone Encounter (Signed)
Scheduled appt per 4/21 los. °

## 2018-04-30 ENCOUNTER — Encounter: Payer: Self-pay | Admitting: Nurse Practitioner

## 2018-04-30 ENCOUNTER — Telehealth: Payer: Self-pay | Admitting: *Deleted

## 2018-04-30 NOTE — Telephone Encounter (Signed)
Called patient and confirmed her chromogranin is normal per Dr. Benay Spice. [facility] did not do the Rebase chromogranin, however it was requested yesterday and is being run. Will release results to Mychart when available. She will call Monday if no result is posted.

## 2018-05-05 ENCOUNTER — Encounter: Payer: Self-pay | Admitting: Nurse Practitioner

## 2018-05-05 LAB — CHROMOGRANIN A REBASELINE
Chromogranin A (ng/mL): 92.5 ng/mL (ref 0.0–101.8)
Chromogranin A: 4 nmol/L (ref 0–5)

## 2018-05-07 ENCOUNTER — Encounter: Payer: Self-pay | Admitting: Nurse Practitioner

## 2018-05-08 ENCOUNTER — Telehealth: Payer: Self-pay | Admitting: *Deleted

## 2018-05-08 NOTE — Telephone Encounter (Signed)
Patient called to request information about the new Chromogranin A testing results. The scale has changed and she would like to clarify her recent results 4/21 The Rebaseline has confused her. Please see MyChart message.

## 2018-05-11 ENCOUNTER — Telehealth: Payer: Self-pay | Admitting: Oncology

## 2018-05-11 NOTE — Telephone Encounter (Signed)
Added lab and moved injection earlier in the day on 6/16 per sch msg. Called patient. No answer. Left msg.

## 2018-05-26 ENCOUNTER — Other Ambulatory Visit: Payer: Self-pay

## 2018-05-26 ENCOUNTER — Inpatient Hospital Stay: Payer: BLUE CROSS/BLUE SHIELD | Attending: Oncology

## 2018-05-26 VITALS — BP 109/80 | HR 64 | Temp 98.5°F | Resp 18

## 2018-05-26 DIAGNOSIS — E340 Carcinoid syndrome: Secondary | ICD-10-CM | POA: Insufficient documentation

## 2018-05-26 DIAGNOSIS — Z79899 Other long term (current) drug therapy: Secondary | ICD-10-CM | POA: Insufficient documentation

## 2018-05-26 DIAGNOSIS — C7A8 Other malignant neuroendocrine tumors: Secondary | ICD-10-CM | POA: Insufficient documentation

## 2018-05-26 DIAGNOSIS — C7A019 Malignant carcinoid tumor of the small intestine, unspecified portion: Secondary | ICD-10-CM

## 2018-05-26 MED ORDER — OCTREOTIDE ACETATE 30 MG IM KIT
PACK | INTRAMUSCULAR | Status: AC
  Filled 2018-05-26: qty 1

## 2018-05-26 MED ORDER — OCTREOTIDE ACETATE 30 MG IM KIT
30.0000 mg | PACK | Freq: Once | INTRAMUSCULAR | Status: AC
  Administered 2018-05-26: 30 mg via INTRAMUSCULAR

## 2018-05-26 NOTE — Patient Instructions (Signed)

## 2018-06-23 ENCOUNTER — Ambulatory Visit: Payer: Self-pay

## 2018-06-23 ENCOUNTER — Encounter (HOSPITAL_COMMUNITY)
Admission: RE | Admit: 2018-06-23 | Discharge: 2018-06-23 | Disposition: A | Discharge status: Home / Self Care | Payer: BC Managed Care – PPO | Source: Ambulatory Visit | Attending: Oncology | Admitting: Oncology

## 2018-06-23 ENCOUNTER — Inpatient Hospital Stay: Payer: BC Managed Care – PPO

## 2018-06-23 ENCOUNTER — Other Ambulatory Visit: Payer: Self-pay

## 2018-06-23 ENCOUNTER — Inpatient Hospital Stay: Payer: BC Managed Care – PPO | Attending: Oncology

## 2018-06-23 VITALS — BP 111/72 | HR 66 | Temp 98.2°F | Resp 18

## 2018-06-23 DIAGNOSIS — C7A019 Malignant carcinoid tumor of the small intestine, unspecified portion: Secondary | ICD-10-CM | POA: Diagnosis not present

## 2018-06-23 LAB — CBC WITH DIFFERENTIAL (CANCER CENTER ONLY)
Abs Immature Granulocytes: 0 10*3/uL (ref 0.00–0.07)
Basophils Absolute: 0 10*3/uL (ref 0.0–0.1)
Basophils Relative: 1 %
Eosinophils Absolute: 0.2 10*3/uL (ref 0.0–0.5)
Eosinophils Relative: 4 %
HCT: 41.1 % (ref 36.0–46.0)
Hemoglobin: 13.6 g/dL (ref 12.0–15.0)
Immature Granulocytes: 0 %
Lymphocytes Relative: 37 %
Lymphs Abs: 1.3 10*3/uL (ref 0.7–4.0)
MCH: 31.6 pg (ref 26.0–34.0)
MCHC: 33.1 g/dL (ref 30.0–36.0)
MCV: 95.4 fL (ref 80.0–100.0)
Monocytes Absolute: 0.3 10*3/uL (ref 0.1–1.0)
Monocytes Relative: 8 %
Neutro Abs: 1.8 10*3/uL (ref 1.7–7.7)
Neutrophils Relative %: 50 %
Platelet Count: 168 10*3/uL (ref 150–400)
RBC: 4.31 MIL/uL (ref 3.87–5.11)
RDW: 12.4 % (ref 11.5–15.5)
WBC Count: 3.5 10*3/uL — ABNORMAL LOW (ref 4.0–10.5)
nRBC: 0 % (ref 0.0–0.2)

## 2018-06-23 LAB — CMP (CANCER CENTER ONLY)
ALT: 14 U/L (ref 0–44)
AST: 21 U/L (ref 15–41)
Albumin: 4 g/dL (ref 3.5–5.0)
Alkaline Phosphatase: 68 U/L (ref 38–126)
Anion gap: 9 (ref 5–15)
BUN: 12 mg/dL (ref 6–20)
CO2: 28 mmol/L (ref 22–32)
Calcium: 9.1 mg/dL (ref 8.9–10.3)
Chloride: 107 mmol/L (ref 98–111)
Creatinine: 0.75 mg/dL (ref 0.44–1.00)
GFR, Est AFR Am: >60 mL/min (ref >60)
GFR, Estimated: >60 mL/min (ref >60)
Glucose, Bld: 84 mg/dL (ref 70–99)
Potassium: 4.3 mmol/L (ref 3.5–5.1)
Sodium: 144 mmol/L (ref 135–145)
Total Bilirubin: 0.6 mg/dL (ref 0.3–1.2)
Total Protein: 7.1 g/dL (ref 6.5–8.1)

## 2018-06-23 MED ORDER — OCTREOTIDE ACETATE 30 MG IM KIT
PACK | INTRAMUSCULAR | Status: AC
  Filled 2018-06-23: qty 1

## 2018-06-23 MED ORDER — GALLIUM GA 68 DOTATATE IV KIT
3.3000 | PACK | Freq: Once | INTRAVENOUS | Status: AC | PRN
  Administered 2018-06-23: 3.3 via INTRAVENOUS

## 2018-06-23 MED ORDER — OCTREOTIDE ACETATE 30 MG IM KIT
30.0000 mg | PACK | Freq: Once | INTRAMUSCULAR | Status: AC
  Administered 2018-06-23: 30 mg via INTRAMUSCULAR

## 2018-06-24 ENCOUNTER — Encounter: Payer: Self-pay | Admitting: Oncology

## 2018-06-24 ENCOUNTER — Telehealth: Payer: Self-pay | Admitting: *Deleted

## 2018-06-24 LAB — CHROMOGRANIN A: Chromogranin A (ng/mL): 90.3 ng/mL (ref 0.0–101.8)

## 2018-06-24 NOTE — Telephone Encounter (Signed)
Notified patient of MD notes on her PET scan: Decreased activity in liver lesions; no new lesions; few lesions measured very slightly larger; looks good; follow up as scheduled; no treatment needed at this time. She was not comfortable with this summary and has many questions and wishes to speak w/MD> MD notified.

## 2018-07-21 ENCOUNTER — Other Ambulatory Visit: Payer: Self-pay

## 2018-07-21 ENCOUNTER — Inpatient Hospital Stay: Payer: BC Managed Care – PPO | Attending: Oncology

## 2018-07-21 ENCOUNTER — Inpatient Hospital Stay (HOSPITAL_BASED_OUTPATIENT_CLINIC_OR_DEPARTMENT_OTHER): Payer: BC Managed Care – PPO | Admitting: Oncology

## 2018-07-21 VITALS — BP 97/63 | HR 66 | Temp 98.3°F | Resp 17 | Ht 65.0 in | Wt 123.5 lb

## 2018-07-21 DIAGNOSIS — C7A019 Malignant carcinoid tumor of the small intestine, unspecified portion: Secondary | ICD-10-CM

## 2018-07-21 DIAGNOSIS — C7B02 Secondary carcinoid tumors of liver: Secondary | ICD-10-CM | POA: Diagnosis not present

## 2018-07-21 DIAGNOSIS — C7A8 Other malignant neuroendocrine tumors: Secondary | ICD-10-CM | POA: Diagnosis not present

## 2018-07-21 MED ORDER — OCTREOTIDE ACETATE 30 MG IM KIT
PACK | INTRAMUSCULAR | Status: AC
  Filled 2018-07-21: qty 1

## 2018-07-21 MED ORDER — OCTREOTIDE ACETATE 30 MG IM KIT
30.0000 mg | PACK | Freq: Once | INTRAMUSCULAR | Status: AC
  Administered 2018-07-21: 30 mg via INTRAMUSCULAR

## 2018-07-21 NOTE — Progress Notes (Signed)
[place] OFFICE PROGRESS NOTE   Diagnosis: Carcinoid tumor  INTERVAL HISTORY:   Brittney [name] returns as scheduled.  She feels well.  She is running for exercise.  She has 6-8 bowel movements per day.  No diarrhea.  She continues monthly Sandostatin. She is scheduled for colonoscopy tomorrow. A Netspot 06/23/2018 revealed slight enlargement of several liver lesions, decreased tracer activity associated with multiple lesions.  No new lesions.  Degenerative changes at the cervical and thoracic spine.  Objective:  Vital signs in last 24 hours:  Blood pressure 97/63, pulse 66, temperature 98.3 F (36.8 C), temperature source Oral, resp. rate 17, height 5\' 5"  (1.651 m), weight 123 lb 8 oz (56 kg), last menstrual period 01/31/2014, SpO2 100 %.    Limited physical examination secondary to distancing with the COVID pandemic GI: No hepatomegaly, no mass, nontender Vascular: No leg edema   Lab Results:  Lab Results  Component Value Date   WBC 3.5 (L) 06/23/2018   HGB 13.6 06/23/2018   HCT 41.1 06/23/2018   MCV 95.4 06/23/2018   PLT 168 06/23/2018   NEUTROABS 1.8 06/23/2018    CMP  Lab Results  Component Value Date   NA 144 06/23/2018   K 4.3 06/23/2018   CL 107 06/23/2018   CO2 28 06/23/2018   GLUCOSE 84 06/23/2018   BUN 12 06/23/2018   CREATININE 0.75 06/23/2018   CALCIUM 9.1 06/23/2018   PROT 7.1 06/23/2018   ALBUMIN 4.0 06/23/2018   AST 21 06/23/2018   ALT 14 06/23/2018   ALKPHOS 68 06/23/2018   BILITOT 0.6 06/23/2018   GFRNONAA >60 06/23/2018   GFRAA >60 06/23/2018   Chromogranin A on 06/23/2018: 90.3 Medications: I have reviewed the patient's current medications.   Assessment/Plan: 1. Metastatic neuroendocrine tumor ? MRI of the abdomen 10/02/2016 confirmed multiple enhancing liver lesions consistent with metastases, no primary tumor site identified ? Ultrasound-guided biopsy of a left liver lesion 10/07/2016-neuroendocrine neoplasm,  "intermediate grade ", review of pathology at GI tumor conference consistent with a low-grade carcinoid tumor ? Elevated chromogranin A 10/15/2016 ? gallium-DOTATATEscan 10/23/2016-multiple foci of liver metastases, enlarged central mesenteric lymph nodes, short intussusception's in adjacent small bowel felt to represent a primary small bowel tumor, additional mesenteric lymph nodes with metabolic activity, deep right pelvic nodule consistent with a pelvic metastasis ? Small bowel resection 11/21/2016-mid ileum mass, low-grade neuroendocrine tumor,pT4,pN2, 6/18 lymph nodes positive, positive mesenteric resection margin (lymph node), intramural satellite nodule ? CT 11/30/2016-small bowel obstruction with transition point adjacent to suture line at the distal small bowel, liver metastases similar to the 10/02/2016 MRI ? Recurrent obstructive symptoms requiring repeat surgery with resection of the small bowel anastomosis site 12/13/2016, pathology negative for malignancy ? Initiation of monthly Sandostatin 12/20/2016 ? CT abdomen/pelvis 02/19/2017-mild enlargement of liver lesions compared to November 2018 ? Cycle 1 Lutathera 06/17/2017 ? Cycle 2 Lutathera 08/13/2017 ? Cycle 3 Lutathera 10/07/2017 ? Cycle 4 Lutathera 12/02/2017 ? Dotatate scan 12/29/2017-no evidence of new or metastatic disease or progression.  Multiple hepatic metastasis.  Lesions have decreased in size from the most recent CT scan and slightly increased in radiotracer activity from dotatate PET scan 10/23/2016.  Interval resection of small bowel tumor and bulky mesenteric metastasis.  No residual activity in the bowel or mesentery.  Single small focus of activity in the deep right pelvis along the peritoneal surface not changed from comparison exam. ? Monthly Sandostatin continued ? Netspot 06/23/2018- mild decrease in radiotracer activity of all hepatic metastases,  no new lesions, slight enlargement of several liver lesions ? Monthly  Sandostatin continued  2. 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 Intermittent abdominal bloating and left-sided abdominal pain- resolved  4.Admission 11/30/2016 with a small bowel obstruction  Disposition: Ms. Encinas appears stable.  The restaging Netspot last month revealed improvement in radiotracer activity and slight enlargement of a few liver lesions.  No new lesions.  The plan is to continue Sandostatin.  She will return for an office visit and chromogranin a level in 3 months.  She is scheduled for colonoscopy tomorrow with Dr. Michail Sermon.  [name], MD  07/21/2018  12:48 PM

## 2018-07-22 ENCOUNTER — Telehealth: Payer: Self-pay | Admitting: Oncology

## 2018-07-22 NOTE — Telephone Encounter (Signed)
Called and spoke with patient. Confirmed upcoming appts  

## 2018-08-18 ENCOUNTER — Other Ambulatory Visit: Payer: Self-pay

## 2018-08-18 ENCOUNTER — Inpatient Hospital Stay: Payer: BC Managed Care – PPO | Attending: Oncology

## 2018-08-18 ENCOUNTER — Ambulatory Visit: Payer: BC Managed Care – PPO

## 2018-08-18 VITALS — BP 115/50 | HR 75 | Temp 98.5°F | Resp 18

## 2018-08-18 DIAGNOSIS — C7A8 Other malignant neuroendocrine tumors: Secondary | ICD-10-CM | POA: Diagnosis present

## 2018-08-18 DIAGNOSIS — Z79899 Other long term (current) drug therapy: Secondary | ICD-10-CM | POA: Insufficient documentation

## 2018-08-18 DIAGNOSIS — C7B02 Secondary carcinoid tumors of liver: Secondary | ICD-10-CM | POA: Insufficient documentation

## 2018-08-18 DIAGNOSIS — C7A019 Malignant carcinoid tumor of the small intestine, unspecified portion: Secondary | ICD-10-CM

## 2018-08-18 MED ORDER — OCTREOTIDE ACETATE 30 MG IM KIT
30.0000 mg | PACK | Freq: Once | INTRAMUSCULAR | Status: AC
  Administered 2018-08-18: 30 mg via INTRAMUSCULAR

## 2018-08-18 MED ORDER — OCTREOTIDE ACETATE 30 MG IM KIT
PACK | INTRAMUSCULAR | Status: AC
  Filled 2018-08-18: qty 1

## 2018-09-08 IMAGING — US US BIOPSY CORE LIVER
1 series · 8 of 8 positions shown · non-contrast
Comparison: none

INDICATION: Liver lesions.  Un known primary.

[Series 1: us biopsy core liver · 0.15mm/px · 8 of 8 slices shown]
[im 1/8]
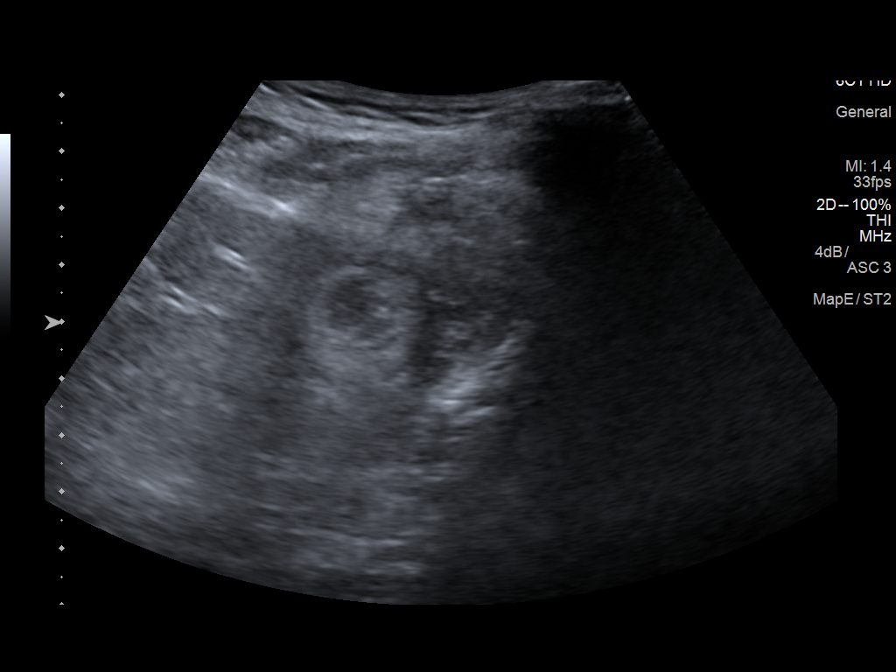
[im 2/8]
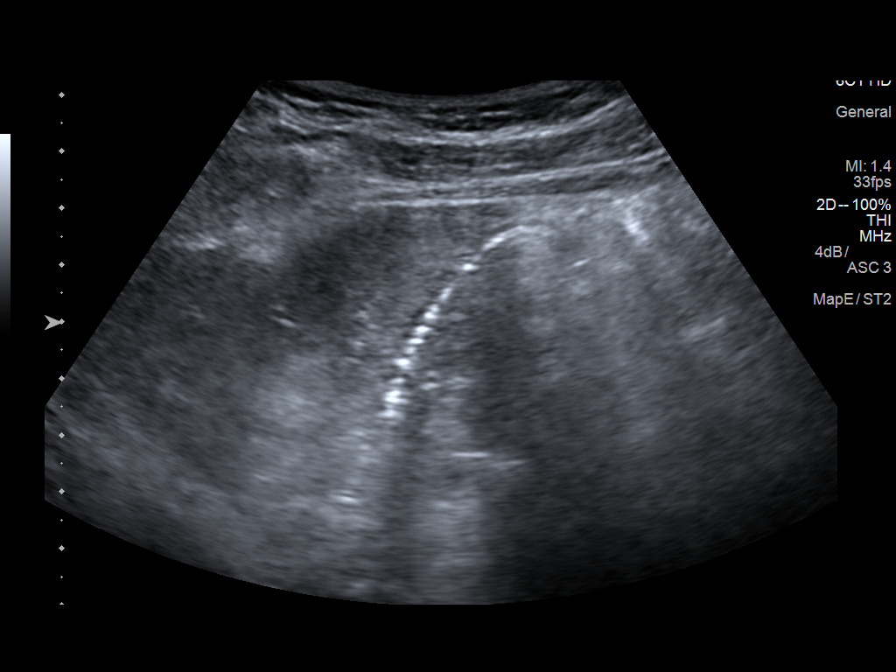
[im 3/8]
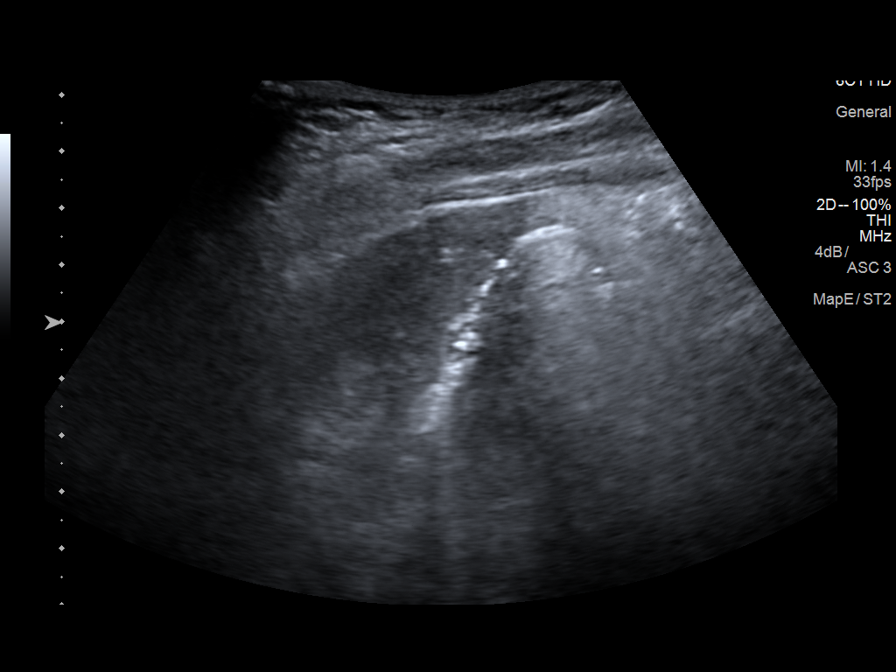
[im 4/8]
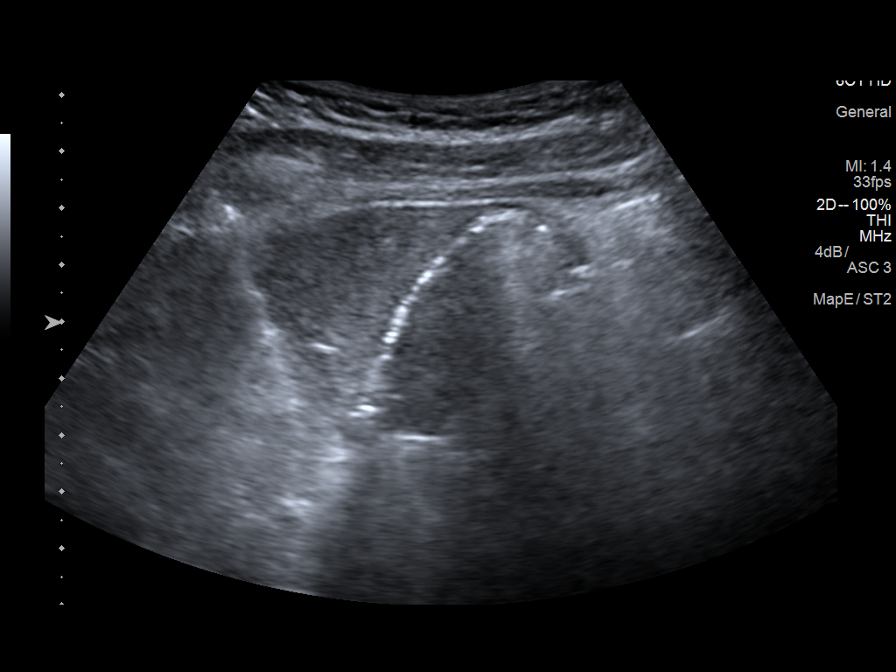
[im 5/8]
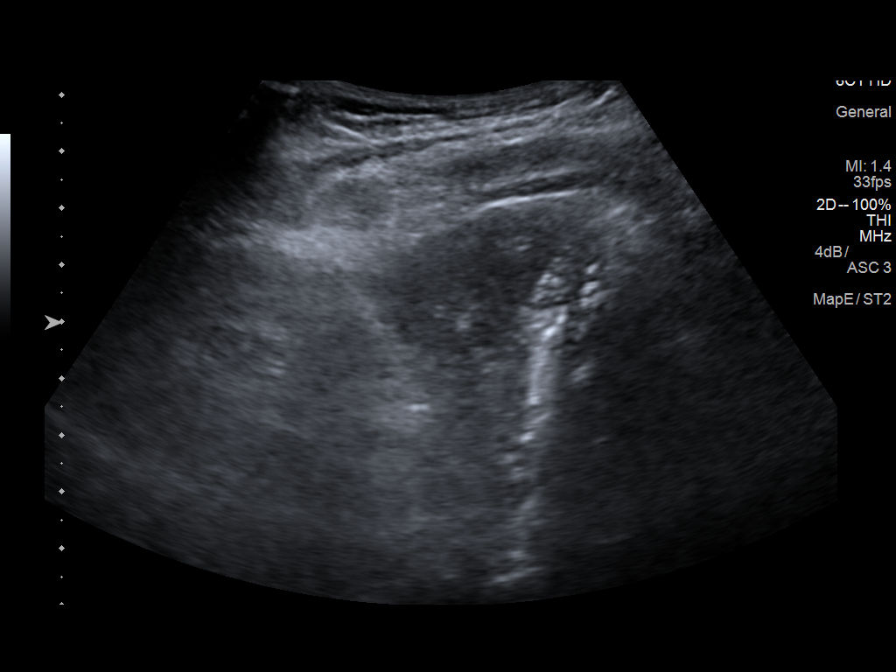
[im 6/8]
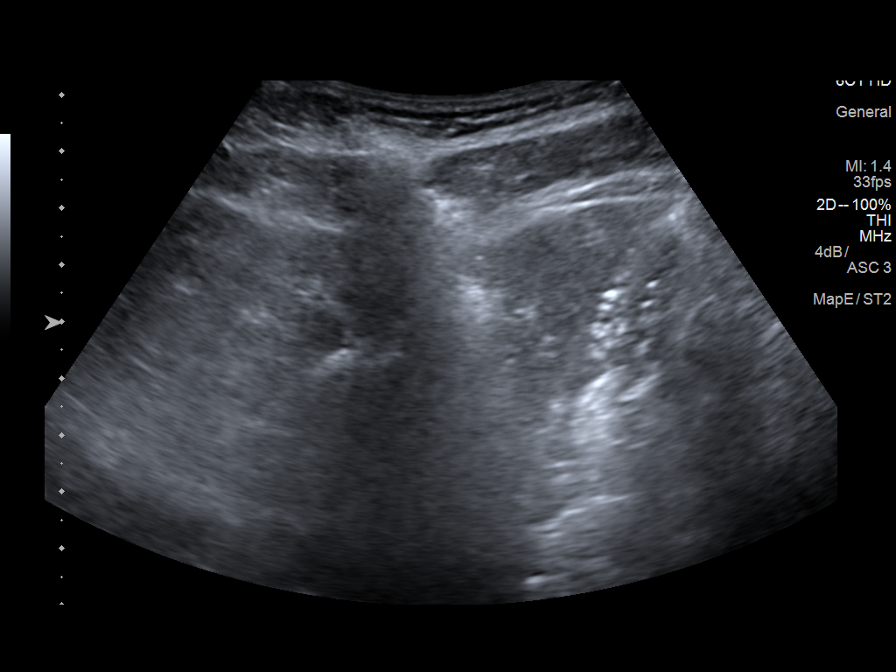
[im 7/8]
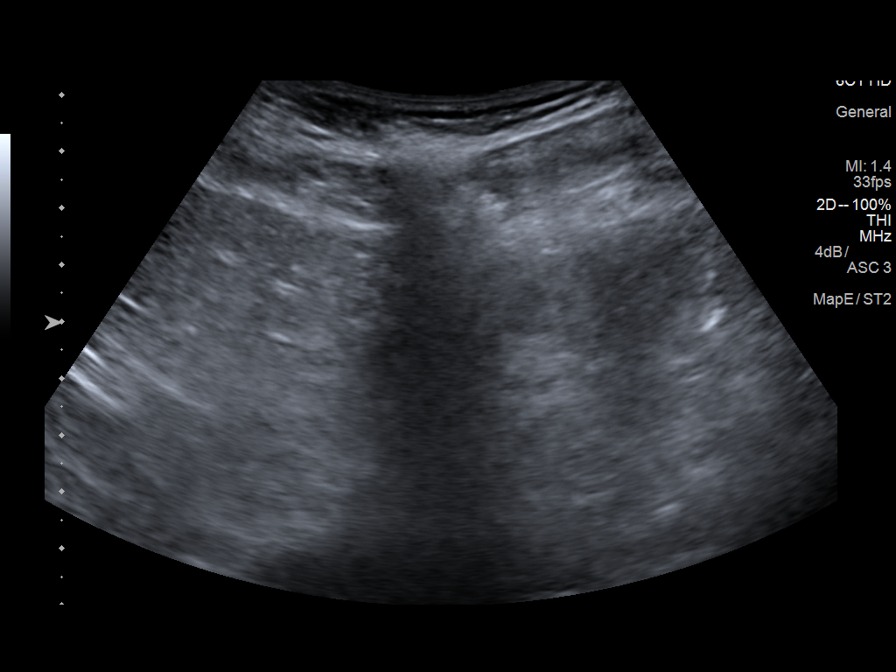
[im 8/8]
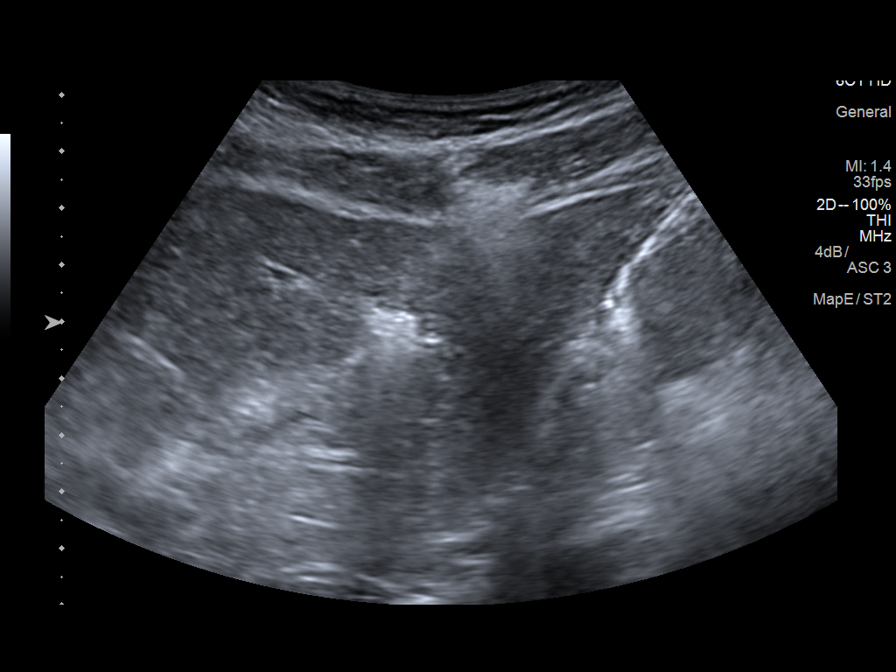

[8 of 8 positions shown; findings below may reference images not displayed]

EXAM:
ULTRASOUND-GUIDED CORE BIOPSY OF A LESION IN THE LATERAL SEGMENT OF
THE LEFT LOBE OF THE LIVER

MEDICATIONS:
None.

ANESTHESIA/SEDATION:
Fentanyl 100 mcg IV; Versed 2 mg IV

Moderate Sedation Time:  15

The patient was continuously monitored during the procedure by the
interventional radiology nurse under my direct supervision.

FLUOROSCOPY TIME:  Fluoroscopy Time:  minutes  seconds ( mGy).

COMPLICATIONS:
None immediate.

PROCEDURE:
Informed written consent was obtained from the patient after a
thorough discussion of the procedural risks, benefits and
alternatives. All questions were addressed. Maximal Sterile Barrier
Technique was utilized including caps, mask, sterile gowns, sterile
gloves, sterile drape, hand hygiene and skin antiseptic. A timeout
was performed prior to the initiation of the procedure.

The epigastrium was prepped with ChloraPrep in a sterile fashion,
and a sterile drape was applied covering the operative field. A
sterile gown and sterile gloves were used for the procedure.

Under sonographic guidance, an 17 gauge guide needle was advanced
into the left lobe liver lesion. Subsequently 3 18 gauge core
biopsies were obtained. Gel-Foam slurry was injected into the needle
tract. The guide needle was removed. Final imaging was performed.

Patient tolerated the procedure well without complication. Vital
sign monitoring by nursing staff during the procedure will continue
as patient is in the special procedures unit for post procedure
observation.
FINDINGS: The images document guide needle placement within the left lobe
liver lesion. Post biopsy images demonstrate no hemorrhage.
IMPRESSION: Successful ultrasound-guided core biopsy of a lesion in the lateral
segment of the left lobe of the liver.

## 2018-09-15 ENCOUNTER — Inpatient Hospital Stay: Payer: BC Managed Care – PPO | Attending: Oncology

## 2018-09-15 ENCOUNTER — Other Ambulatory Visit: Payer: Self-pay

## 2018-09-15 ENCOUNTER — Ambulatory Visit: Payer: BC Managed Care – PPO

## 2018-09-15 VITALS — BP 110/71 | Temp 99.1°F | Resp 18

## 2018-09-15 DIAGNOSIS — C7B02 Secondary carcinoid tumors of liver: Secondary | ICD-10-CM | POA: Insufficient documentation

## 2018-09-15 DIAGNOSIS — C7A019 Malignant carcinoid tumor of the small intestine, unspecified portion: Secondary | ICD-10-CM | POA: Insufficient documentation

## 2018-09-15 MED ORDER — OCTREOTIDE ACETATE 30 MG IM KIT
30.0000 mg | PACK | Freq: Once | INTRAMUSCULAR | Status: AC
  Administered 2018-09-15: 12:00:00 30 mg via INTRAMUSCULAR

## 2018-09-15 NOTE — Patient Instructions (Signed)
Octreotide injection solution °What is this medicine? °OCTREOTIDE (ok TREE oh tide) is used to reduce blood levels of growth hormone in patients with a condition called acromegaly. This medicine also reduces flushing and watery diarrhea caused by certain types of cancer. °This medicine may be used for other purposes; ask your health care provider or pharmacist if you have questions. °COMMON BRAND NAME(S): Bynfezia, Sandostatin, Sandostatin LAR °What should I tell my health care provider before I take this medicine? °They need to know if you have any of these conditions: °· diabetes °· gallbladder disease °· kidney disease °· liver disease °· an unusual or allergic reaction to octreotide, other medicines, foods, dyes, or preservatives °· pregnant or trying to get pregnant °· breast-feeding °How should I use this medicine? °This medicine is for injection under the skin or into a vein (only in emergency situations). It is usually given by a health care professional in a hospital or clinic setting. °If you get this medicine at home, you will be taught how to prepare and give this medicine. Allow the injection solution to come to room temperature before use. Do not warm it artificially. Use exactly as directed. Take your medicine at regular intervals. Do not take your medicine more often than directed. °It is important that you put your used needles and syringes in a special sharps container. Do not put them in a trash can. If you do not have a sharps container, call your pharmacist or healthcare provider to get one. °Talk to your pediatrician regarding the use of this medicine in children. Special care may be needed. °Overdosage: If you think you have taken too much of this medicine contact a poison control center or emergency room at once. °NOTE: This medicine is only for you. Do not share this medicine with others. °What if I miss a dose? °If you miss a dose, take it as soon as you can. If it is almost time for your  next dose, take only that dose. Do not take double or extra doses. °What may interact with this medicine? °Do not take this medicine with any of the following medications: °· cisapride °· droperidol °· general anesthetics °· grepafloxacin °· perphenazine °· thioridazine °This medicine may also interact with the following medications: °· bromocriptine °· cyclosporine °· diuretics °· medicines for blood pressure, heart disease, irregular heart beat °· medicines for diabetes, including insulin °· quinidine °This list may not describe all possible interactions. Give your health care provider a list of all the medicines, herbs, non-prescription drugs, or dietary supplements you use. Also tell them if you smoke, drink alcohol, or use illegal drugs. Some items may interact with your medicine. °What should I watch for while using this medicine? °Visit your doctor or health care professional for regular checks on your progress. °To help reduce irritation at the injection site, use a different site for each injection and make sure the solution is at room temperature before use. °This medicine may cause decreases in blood sugar. Signs of low blood sugar include chills, cool, pale skin or cold sweats, drowsiness, extreme hunger, fast heartbeat, headache, nausea, nervousness or anxiety, shakiness, trembling, unsteadiness, tiredness, or weakness. Contact your doctor or health care professional right away if you experience any of these symptoms. °This medicine may increase blood sugar. Ask your healthcare provider if changes in diet or medicines are needed if you have diabetes. °This medicine may cause a decrease in vitamin B12. You should make sure that you get enough vitamin B12   while you are taking this medicine. Discuss the foods you eat and the vitamins you take with your health care professional. °What side effects may I notice from receiving this medicine? °Side effects that you should report to your doctor or health care  professional as soon as possible: °· allergic reactions like skin rash, itching or hives, swelling of the face, lips, or tongue °· decreases in blood sugar °· changes in heart rate °· severe stomach pain °·  °signs and symptoms of high blood sugar such as being more thirsty or hungry or having to urinate more than normal. You may also feel very tired or have blurry vision. °Side effects that usually do not require medical attention (report to your doctor or health care professional if they continue or are bothersome): °· diarrhea or constipation °· gas or stomach pain °· nausea, vomiting °· pain, redness, swelling and irritation at site where injected °This list may not describe all possible side effects. Call your doctor for medical advice about side effects. You may report side effects to FDA at 1-800-FDA-1088. °Where should I keep my medicine? °Keep out of the reach of children. °Store in a refrigerator between 2 and 8 degrees C (36 and 46 degrees F). Protect from light. Allow to come to room temperature naturally. Do not use artificial heat. If protected from light, the injection may be stored at room temperature between 20 and 30 degrees C (70 and 86 degrees F) for 14 days. After the initial use, throw away any unused portion of a multiple dose vial after 14 days. Throw away unused portions of the ampules after use. °NOTE: This sheet is a summary. It may not cover all possible information. If you have questions about this medicine, talk to your doctor, pharmacist, or health care provider. °© 2020 [employer] (2017-10-02 08:07:09) ° °

## 2018-10-13 ENCOUNTER — Inpatient Hospital Stay: Payer: BC Managed Care – PPO | Attending: Oncology | Admitting: Oncology

## 2018-10-13 ENCOUNTER — Inpatient Hospital Stay: Payer: BC Managed Care – PPO

## 2018-10-13 ENCOUNTER — Other Ambulatory Visit: Payer: Self-pay

## 2018-10-13 VITALS — BP 95/53 | HR 65 | Temp 98.0°F | Resp 18 | Ht 65.0 in | Wt 127.2 lb

## 2018-10-13 DIAGNOSIS — Z23 Encounter for immunization: Secondary | ICD-10-CM

## 2018-10-13 DIAGNOSIS — C7B02 Secondary carcinoid tumors of liver: Secondary | ICD-10-CM | POA: Insufficient documentation

## 2018-10-13 DIAGNOSIS — D509 Iron deficiency anemia, unspecified: Secondary | ICD-10-CM | POA: Insufficient documentation

## 2018-10-13 DIAGNOSIS — C7A019 Malignant carcinoid tumor of the small intestine, unspecified portion: Secondary | ICD-10-CM

## 2018-10-13 MED ORDER — OCTREOTIDE ACETATE 30 MG IM KIT
PACK | INTRAMUSCULAR | Status: AC
  Filled 2018-10-13: qty 1

## 2018-10-13 MED ORDER — INFLUENZA VAC SPLIT QUAD 0.5 ML IM SUSY
PREFILLED_SYRINGE | INTRAMUSCULAR | Status: AC
  Filled 2018-10-13: qty 0.5

## 2018-10-13 MED ORDER — OCTREOTIDE ACETATE 30 MG IM KIT
30.0000 mg | PACK | Freq: Once | INTRAMUSCULAR | Status: AC
  Administered 2018-10-13: 14:00:00 30 mg via INTRAMUSCULAR

## 2018-10-13 MED ORDER — INFLUENZA VAC SPLIT QUAD 0.5 ML IM SUSY
0.5000 mL | PREFILLED_SYRINGE | Freq: Once | INTRAMUSCULAR | Status: AC
  Administered 2018-10-13: 0.5 mL via INTRAMUSCULAR

## 2018-10-13 NOTE — Progress Notes (Signed)
[place] OFFICE PROGRESS NOTE   Diagnosis: Carcinoid tumor  INTERVAL HISTORY:   Brittney [name] returns as scheduled.  She continues monthly Sandostatin.  She notes some diarrhea over the week following Sandostatin.  Her bowel habits are otherwise regular Metamucil.  Good appetite and energy level.  She exercises frequently.  She underwent a colonoscopy with Dr. Michail Sermon this summer (I do not have the report available today).  She has noted occasional discomfort in the left abdomen and nominal bloating.  Objective:  Vital signs in last 24 hours:  Blood pressure (!) 95/53, pulse 65, temperature 98 F (36.7 C), temperature source Temporal, resp. rate 18, height 5\' 5"  (1.651 m), weight 127 lb 3.2 oz (57.7 kg), last menstrual period 01/31/2014, SpO2 100 %.    GI: No hepatosplenomegaly, no mass, nondistended, nontender, no apparent ascites Vascular: No leg edema     Lab Results:  Lab Results  Component Value Date   WBC 3.5 (L) 06/23/2018   HGB 13.6 06/23/2018   HCT 41.1 06/23/2018   MCV 95.4 06/23/2018   PLT 168 06/23/2018   NEUTROABS 1.8 06/23/2018    CMP  Lab Results  Component Value Date   NA 144 06/23/2018   K 4.3 06/23/2018   CL 107 06/23/2018   CO2 28 06/23/2018   GLUCOSE 84 06/23/2018   BUN 12 06/23/2018   CREATININE 0.75 06/23/2018   CALCIUM 9.1 06/23/2018   PROT 7.1 06/23/2018   ALBUMIN 4.0 06/23/2018   AST 21 06/23/2018   ALT 14 06/23/2018   ALKPHOS 68 06/23/2018   BILITOT 0.6 06/23/2018   GFRNONAA >60 06/23/2018   GFRAA >60 06/23/2018   Chromogranin A on 06/23/2018: 9.3  Medications: I have reviewed the patient's current medications.   Assessment/Plan: 1. Metastatic neuroendocrine tumor ? MRI of the abdomen 10/02/2016 confirmed multiple enhancing liver lesions consistent with metastases, no primary tumor site identified ? Ultrasound-guided biopsy of a left liver lesion 10/07/2016-neuroendocrine neoplasm, "intermediate grade ",  review of pathology at GI tumor conference consistent with a low-grade carcinoid tumor ? Elevated chromogranin A 10/15/2016 ? gallium-DOTATATEscan 10/23/2016-multiple foci of liver metastases, enlarged central mesenteric lymph nodes, short intussusception's in adjacent small bowel felt to represent a primary small bowel tumor, additional mesenteric lymph nodes with metabolic activity, deep right pelvic nodule consistent with a pelvic metastasis ? Small bowel resection 11/21/2016-mid ileum mass, low-grade neuroendocrine tumor,pT4,pN2, 6/18 lymph nodes positive, positive mesenteric resection margin (lymph node), intramural satellite nodule ? CT 11/30/2016-small bowel obstruction with transition point adjacent to suture line at the distal small bowel, liver metastases similar to the 10/02/2016 MRI ? Recurrent obstructive symptoms requiring repeat surgery with resection of the small bowel anastomosis site 12/13/2016, pathology negative for malignancy ? Initiation of monthly Sandostatin 12/20/2016 ? CT abdomen/pelvis 02/19/2017-mild enlargement of liver lesions compared to November 2018 ? Cycle 1 Lutathera 06/17/2017 ? Cycle 2 Lutathera 08/13/2017 ? Cycle 3 Lutathera 10/07/2017 ? Cycle 4 Lutathera 12/02/2017 ? Dotatate scan 12/29/2017-no evidence of new or metastatic disease or progression.  Multiple hepatic metastasis.  Lesions have decreased in size from the most recent CT scan and slightly increased in radiotracer activity from dotatate PET scan 10/23/2016.  Interval resection of small bowel tumor and bulky mesenteric metastasis.  No residual activity in the bowel or mesentery.  Single small focus of activity in the deep right pelvis along the peritoneal surface not changed from comparison exam. ? Monthly Sandostatin continued ? Netspot 06/23/2018- mild decrease in radiotracer activity of all hepatic metastases,  no new lesions, slight enlargement of several liver lesions ? Monthly Sandostatin continued   2. 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 Intermittent abdominal bloating and left-sided abdominal pain- resolved  4.Admission 11/30/2016 with a small bowel obstruction    Disposition: Brittney [name] appears stable.  I suspect the intermittent intermittent abdominal symptoms are related to surgery.  I have a low clinical suspicion for progression of the carcinoid tumor.  She will continue monthly Sandostatin.  Brittney [name] will return for an office visit and chromogranin a level in 3 months.  We will consider a repeat Netspot at an 8-72-month interval.  [name], MD  10/13/2018  12:54 PM

## 2018-10-14 ENCOUNTER — Telehealth: Payer: Self-pay | Admitting: Oncology

## 2018-10-14 LAB — CHROMOGRANIN A: Chromogranin A (ng/mL): 71.2 ng/mL (ref 0.0–101.8)

## 2018-10-14 NOTE — Telephone Encounter (Signed)
Called and spoke with patient. Confirmed appts  °

## 2018-10-15 ENCOUNTER — Telehealth: Payer: Self-pay | Admitting: *Deleted

## 2018-10-15 NOTE — Telephone Encounter (Signed)
Notified of normal Chromogranin level and to f/u as scheduled.

## 2018-10-15 NOTE — Telephone Encounter (Signed)
-----   Message from Ladell Pier, MD sent at 10/15/2018  7:24 AM EDT ----- Please call patient, chromogranin level is normal, f/u as scheduled

## 2018-11-03 ENCOUNTER — Telehealth: Payer: Self-pay | Admitting: Oncology

## 2018-11-03 NOTE — Telephone Encounter (Signed)
Returned patient's phone call regarding rescheduling an appointment, per patient's request November and December appointments have been rescheduled.

## 2018-11-10 ENCOUNTER — Ambulatory Visit: Payer: BC Managed Care – PPO

## 2018-11-10 ENCOUNTER — Other Ambulatory Visit: Payer: Self-pay

## 2018-11-10 ENCOUNTER — Inpatient Hospital Stay: Payer: BC Managed Care – PPO | Attending: Oncology

## 2018-11-10 VITALS — BP 108/79 | HR 62 | Temp 98.6°F | Resp 18

## 2018-11-10 DIAGNOSIS — Z79899 Other long term (current) drug therapy: Secondary | ICD-10-CM | POA: Insufficient documentation

## 2018-11-10 DIAGNOSIS — C7A019 Malignant carcinoid tumor of the small intestine, unspecified portion: Secondary | ICD-10-CM | POA: Diagnosis present

## 2018-11-10 MED ORDER — OCTREOTIDE ACETATE 30 MG IM KIT
PACK | INTRAMUSCULAR | Status: AC
  Filled 2018-11-10: qty 1

## 2018-11-10 MED ORDER — OCTREOTIDE ACETATE 30 MG IM KIT
30.0000 mg | PACK | Freq: Once | INTRAMUSCULAR | Status: AC
  Administered 2018-11-10: 30 mg via INTRAMUSCULAR

## 2018-11-10 NOTE — Patient Instructions (Signed)
Octreotide injection solution °What is this medicine? °OCTREOTIDE (ok TREE oh tide) is used to reduce blood levels of growth hormone in patients with a condition called acromegaly. This medicine also reduces flushing and watery diarrhea caused by certain types of cancer. °This medicine may be used for other purposes; ask your health care provider or pharmacist if you have questions. °COMMON BRAND NAME(S): Bynfezia, Sandostatin, Sandostatin LAR °What should I tell my health care provider before I take this medicine? °They need to know if you have any of these conditions: °· diabetes °· gallbladder disease °· kidney disease °· liver disease °· an unusual or allergic reaction to octreotide, other medicines, foods, dyes, or preservatives °· pregnant or trying to get pregnant °· breast-feeding °How should I use this medicine? °This medicine is for injection under the skin or into a vein (only in emergency situations). It is usually given by a health care professional in a hospital or clinic setting. °If you get this medicine at home, you will be taught how to prepare and give this medicine. Allow the injection solution to come to room temperature before use. Do not warm it artificially. Use exactly as directed. Take your medicine at regular intervals. Do not take your medicine more often than directed. °It is important that you put your used needles and syringes in a special sharps container. Do not put them in a trash can. If you do not have a sharps container, call your pharmacist or healthcare provider to get one. °Talk to your pediatrician regarding the use of this medicine in children. Special care may be needed. °Overdosage: If you think you have taken too much of this medicine contact a poison control center or emergency room at once. °NOTE: This medicine is only for you. Do not share this medicine with others. °What if I miss a dose? °If you miss a dose, take it as soon as you can. If it is almost time for your  next dose, take only that dose. Do not take double or extra doses. °What may interact with this medicine? °Do not take this medicine with any of the following medications: °· cisapride °· droperidol °· general anesthetics °· grepafloxacin °· perphenazine °· thioridazine °This medicine may also interact with the following medications: °· bromocriptine °· cyclosporine °· diuretics °· medicines for blood pressure, heart disease, irregular heart beat °· medicines for diabetes, including insulin °· quinidine °This list may not describe all possible interactions. Give your health care provider a list of all the medicines, herbs, non-prescription drugs, or dietary supplements you use. Also tell them if you smoke, drink alcohol, or use illegal drugs. Some items may interact with your medicine. °What should I watch for while using this medicine? °Visit your doctor or health care professional for regular checks on your progress. °To help reduce irritation at the injection site, use a different site for each injection and make sure the solution is at room temperature before use. °This medicine may cause decreases in blood sugar. Signs of low blood sugar include chills, cool, pale skin or cold sweats, drowsiness, extreme hunger, fast heartbeat, headache, nausea, nervousness or anxiety, shakiness, trembling, unsteadiness, tiredness, or weakness. Contact your doctor or health care professional right away if you experience any of these symptoms. °This medicine may increase blood sugar. Ask your healthcare provider if changes in diet or medicines are needed if you have diabetes. °This medicine may cause a decrease in vitamin B12. You should make sure that you get enough vitamin B12   while you are taking this medicine. Discuss the foods you eat and the vitamins you take with your health care professional. °What side effects may I notice from receiving this medicine? °Side effects that you should report to your doctor or health care  professional as soon as possible: °· allergic reactions like skin rash, itching or hives, swelling of the face, lips, or tongue °· decreases in blood sugar °· changes in heart rate °· severe stomach pain °·  °signs and symptoms of high blood sugar such as being more thirsty or hungry or having to urinate more than normal. You may also feel very tired or have blurry vision. °Side effects that usually do not require medical attention (report to your doctor or health care professional if they continue or are bothersome): °· diarrhea or constipation °· gas or stomach pain °· nausea, vomiting °· pain, redness, swelling and irritation at site where injected °This list may not describe all possible side effects. Call your doctor for medical advice about side effects. You may report side effects to FDA at 1-800-FDA-1088. °Where should I keep my medicine? °Keep out of the reach of children. °Store in a refrigerator between 2 and 8 degrees C (36 and 46 degrees F). Protect from light. Allow to come to room temperature naturally. Do not use artificial heat. If protected from light, the injection may be stored at room temperature between 20 and 30 degrees C (70 and 86 degrees F) for 14 days. After the initial use, throw away any unused portion of a multiple dose vial after 14 days. Throw away unused portions of the ampules after use. °NOTE: This sheet is a summary. It may not cover all possible information. If you have questions about this medicine, talk to your doctor, pharmacist, or health care provider. °© 2020 [employer] (2017-10-02 08:07:09) ° °

## 2018-12-07 ENCOUNTER — Other Ambulatory Visit: Payer: Self-pay

## 2018-12-07 ENCOUNTER — Inpatient Hospital Stay: Payer: BC Managed Care – PPO

## 2018-12-07 VITALS — BP 100/75 | HR 70 | Temp 97.8°F | Resp 18

## 2018-12-07 DIAGNOSIS — C7A019 Malignant carcinoid tumor of the small intestine, unspecified portion: Secondary | ICD-10-CM

## 2018-12-07 MED ORDER — OCTREOTIDE ACETATE 30 MG IM KIT
PACK | INTRAMUSCULAR | Status: AC
  Filled 2018-12-07: qty 1

## 2018-12-07 MED ORDER — OCTREOTIDE ACETATE 30 MG IM KIT
30.0000 mg | PACK | Freq: Once | INTRAMUSCULAR | Status: AC
  Administered 2018-12-07: 10:00:00 30 mg via INTRAMUSCULAR

## 2018-12-08 ENCOUNTER — Ambulatory Visit: Payer: BC Managed Care – PPO

## 2019-01-05 ENCOUNTER — Ambulatory Visit: Payer: BC Managed Care – PPO | Admitting: Oncology

## 2019-01-05 ENCOUNTER — Ambulatory Visit: Payer: BC Managed Care – PPO

## 2019-01-05 ENCOUNTER — Other Ambulatory Visit: Payer: BC Managed Care – PPO

## 2019-01-06 ENCOUNTER — Inpatient Hospital Stay (HOSPITAL_BASED_OUTPATIENT_CLINIC_OR_DEPARTMENT_OTHER): Payer: BC Managed Care – PPO | Admitting: Oncology

## 2019-01-06 ENCOUNTER — Telehealth: Payer: Self-pay | Admitting: Oncology

## 2019-01-06 ENCOUNTER — Other Ambulatory Visit: Payer: Self-pay

## 2019-01-06 ENCOUNTER — Inpatient Hospital Stay: Payer: BC Managed Care – PPO | Attending: Oncology

## 2019-01-06 ENCOUNTER — Inpatient Hospital Stay: Payer: BC Managed Care – PPO

## 2019-01-06 VITALS — BP 90/68 | HR 62 | Temp 97.8°F | Resp 18 | Ht 65.0 in | Wt 130.3 lb

## 2019-01-06 DIAGNOSIS — Z79899 Other long term (current) drug therapy: Secondary | ICD-10-CM | POA: Diagnosis not present

## 2019-01-06 DIAGNOSIS — C7A019 Malignant carcinoid tumor of the small intestine, unspecified portion: Secondary | ICD-10-CM

## 2019-01-06 LAB — CMP (CANCER CENTER ONLY)
ALT: 10 U/L (ref 0–44)
AST: 18 U/L (ref 15–41)
Albumin: 4.3 g/dL (ref 3.5–5.0)
Alkaline Phosphatase: 64 U/L (ref 38–126)
Anion gap: 11 (ref 5–15)
BUN: 16 mg/dL (ref 6–20)
CO2: 29 mmol/L (ref 22–32)
Calcium: 9.1 mg/dL (ref 8.9–10.3)
Chloride: 102 mmol/L (ref 98–111)
Creatinine: 0.79 mg/dL (ref 0.44–1.00)
GFR, Est AFR Am: >60 mL/min (ref >60)
GFR, Estimated: >60 mL/min (ref >60)
Glucose, Bld: 103 mg/dL — ABNORMAL HIGH (ref 70–99)
Potassium: 4.1 mmol/L (ref 3.5–5.1)
Sodium: 142 mmol/L (ref 135–145)
Total Bilirubin: 0.6 mg/dL (ref 0.3–1.2)
Total Protein: 7.4 g/dL (ref 6.5–8.1)

## 2019-01-06 MED ORDER — OCTREOTIDE ACETATE 30 MG IM KIT
30.0000 mg | PACK | Freq: Once | INTRAMUSCULAR | Status: AC
  Administered 2019-01-06: 12:00:00 30 mg via INTRAMUSCULAR

## 2019-01-06 MED ORDER — OCTREOTIDE ACETATE 30 MG IM KIT
PACK | INTRAMUSCULAR | Status: AC
  Filled 2019-01-06: qty 1

## 2019-01-06 NOTE — Patient Instructions (Signed)
Octreotide injection solution °What is this medicine? °OCTREOTIDE (ok TREE oh tide) is used to reduce blood levels of growth hormone in patients with a condition called acromegaly. This medicine also reduces flushing and watery diarrhea caused by certain types of cancer. °This medicine may be used for other purposes; ask your health care provider or pharmacist if you have questions. °COMMON BRAND NAME(S): Bynfezia, Sandostatin, Sandostatin LAR °What should I tell my health care provider before I take this medicine? °They need to know if you have any of these conditions: °· diabetes °· gallbladder disease °· kidney disease °· liver disease °· an unusual or allergic reaction to octreotide, other medicines, foods, dyes, or preservatives °· pregnant or trying to get pregnant °· breast-feeding °How should I use this medicine? °This medicine is for injection under the skin or into a vein (only in emergency situations). It is usually given by a health care professional in a hospital or clinic setting. °If you get this medicine at home, you will be taught how to prepare and give this medicine. Allow the injection solution to come to room temperature before use. Do not warm it artificially. Use exactly as directed. Take your medicine at regular intervals. Do not take your medicine more often than directed. °It is important that you put your used needles and syringes in a special sharps container. Do not put them in a trash can. If you do not have a sharps container, call your pharmacist or healthcare provider to get one. °Talk to your pediatrician regarding the use of this medicine in children. Special care may be needed. °Overdosage: If you think you have taken too much of this medicine contact a poison control center or emergency room at once. °NOTE: This medicine is only for you. Do not share this medicine with others. °What if I miss a dose? °If you miss a dose, take it as soon as you can. If it is almost time for your  next dose, take only that dose. Do not take double or extra doses. °What may interact with this medicine? °Do not take this medicine with any of the following medications: °· cisapride °· droperidol °· general anesthetics °· grepafloxacin °· perphenazine °· thioridazine °This medicine may also interact with the following medications: °· bromocriptine °· cyclosporine °· diuretics °· medicines for blood pressure, heart disease, irregular heart beat °· medicines for diabetes, including insulin °· quinidine °This list may not describe all possible interactions. Give your health care provider a list of all the medicines, herbs, non-prescription drugs, or dietary supplements you use. Also tell them if you smoke, drink alcohol, or use illegal drugs. Some items may interact with your medicine. °What should I watch for while using this medicine? °Visit your doctor or health care professional for regular checks on your progress. °To help reduce irritation at the injection site, use a different site for each injection and make sure the solution is at room temperature before use. °This medicine may cause decreases in blood sugar. Signs of low blood sugar include chills, cool, pale skin or cold sweats, drowsiness, extreme hunger, fast heartbeat, headache, nausea, nervousness or anxiety, shakiness, trembling, unsteadiness, tiredness, or weakness. Contact your doctor or health care professional right away if you experience any of these symptoms. °This medicine may increase blood sugar. Ask your healthcare provider if changes in diet or medicines are needed if you have diabetes. °This medicine may cause a decrease in vitamin B12. You should make sure that you get enough vitamin B12   while you are taking this medicine. Discuss the foods you eat and the vitamins you take with your health care professional. °What side effects may I notice from receiving this medicine? °Side effects that you should report to your doctor or health care  professional as soon as possible: °· allergic reactions like skin rash, itching or hives, swelling of the face, lips, or tongue °· decreases in blood sugar °· changes in heart rate °· severe stomach pain °·  °signs and symptoms of high blood sugar such as being more thirsty or hungry or having to urinate more than normal. You may also feel very tired or have blurry vision. °Side effects that usually do not require medical attention (report to your doctor or health care professional if they continue or are bothersome): °· diarrhea or constipation °· gas or stomach pain °· nausea, vomiting °· pain, redness, swelling and irritation at site where injected °This list may not describe all possible side effects. Call your doctor for medical advice about side effects. You may report side effects to FDA at 1-800-FDA-1088. °Where should I keep my medicine? °Keep out of the reach of children. °Store in a refrigerator between 2 and 8 degrees C (36 and 46 degrees F). Protect from light. Allow to come to room temperature naturally. Do not use artificial heat. If protected from light, the injection may be stored at room temperature between 20 and 30 degrees C (70 and 86 degrees F) for 14 days. After the initial use, throw away any unused portion of a multiple dose vial after 14 days. Throw away unused portions of the ampules after use. °NOTE: This sheet is a summary. It may not cover all possible information. If you have questions about this medicine, talk to your doctor, pharmacist, or health care provider. °© 2020 [employer] (2017-10-02 08:07:09) ° °

## 2019-01-06 NOTE — Progress Notes (Signed)
[name] OFFICE PROGRESS NOTE   Diagnosis: Carcinoid tumor  INTERVAL HISTORY:   Brittney [name] returns as scheduled.  She reports feeling well.  She is exercising.  She continues to have occasional soreness in the left lateral abdomen.  No diarrhea or fever, or flushing.  She and her husband were diagnosed with COVID-19 in early December.  She reports having mild symptoms.  She continues to have intermittent loss of smell.  Objective:  Vital signs in last 24 hours:  Blood pressure 90/68, pulse 62, temperature 97.8 F (36.6 C), temperature source Temporal, resp. rate 18, height 5\' 5"  (1.651 m), weight 130 lb 4.8 oz (59.1 kg), last menstrual period 01/31/2014, SpO2 100 %.    GI: Soft, no hepatosplenomegaly, no mass, no apparent ascites, mild tenderness in the left lateral abdomen Vascular: No leg edema     Lab Results:  Lab Results  Component Value Date   WBC 3.5 (L) 06/23/2018   HGB 13.6 06/23/2018   HCT 41.1 06/23/2018   MCV 95.4 06/23/2018   PLT 168 06/23/2018   NEUTROABS 1.8 06/23/2018    CMP  Lab Results  Component Value Date   NA 144 06/23/2018   K 4.3 06/23/2018   CL 107 06/23/2018   CO2 28 06/23/2018   GLUCOSE 84 06/23/2018   BUN 12 06/23/2018   CREATININE 0.75 06/23/2018   CALCIUM 9.1 06/23/2018   PROT 7.1 06/23/2018   ALBUMIN 4.0 06/23/2018   AST 21 06/23/2018   ALT 14 06/23/2018   ALKPHOS 68 06/23/2018   BILITOT 0.6 06/23/2018   GFRNONAA >60 06/23/2018   GFRAA >60 06/23/2018     Medications: I have reviewed the patient's current medications.   Assessment/Plan: 1. Metastatic neuroendocrine tumor ? MRI of the abdomen 10/02/2016 confirmed multiple enhancing liver lesions consistent with metastases, no primary tumor site identified ? Ultrasound-guided biopsy of a left liver lesion 10/07/2016-neuroendocrine neoplasm, "intermediate grade ", review of pathology at GI tumor conference consistent with a low-grade carcinoid tumor ?  Elevated chromogranin A 10/15/2016 ? gallium-DOTATATEscan 10/23/2016-multiple foci of liver metastases, enlarged central mesenteric lymph nodes, short intussusception's in adjacent small bowel felt to represent a primary small bowel tumor, additional mesenteric lymph nodes with metabolic activity, deep right pelvic nodule consistent with a pelvic metastasis ? Small bowel resection 11/21/2016-mid ileum mass, low-grade neuroendocrine tumor,pT4,pN2, 6/18 lymph nodes positive, positive mesenteric resection margin (lymph node), intramural satellite nodule ? CT 11/30/2016-small bowel obstruction with transition point adjacent to suture line at the distal small bowel, liver metastases similar to the 10/02/2016 MRI ? Recurrent obstructive symptoms requiring repeat surgery with resection of the small bowel anastomosis site 12/13/2016, pathology negative for malignancy ? Initiation of monthly Sandostatin 12/20/2016 ? CT abdomen/pelvis 02/19/2017-mild enlargement of liver lesions compared to November 2018 ? Cycle 1 Lutathera 06/17/2017 ? Cycle 2 Lutathera 08/13/2017 ? Cycle 3 Lutathera 10/07/2017 ? Cycle 4 Lutathera 12/02/2017 ? Dotatate scan 12/29/2017-no evidence of new or metastatic disease or progression.  Multiple hepatic metastasis.  Lesions have decreased in size from the most recent CT scan and slightly increased in radiotracer activity from dotatate PET scan 10/23/2016.  Interval resection of small bowel tumor and bulky mesenteric metastasis.  No residual activity in the bowel or mesentery.  Single small focus of activity in the deep right pelvis along the peritoneal surface not changed from comparison exam. ? Monthly Sandostatin continued ? Netspot 06/23/2018- mild decrease in radiotracer activity of all hepatic metastases, no new lesions, slight enlargement of several liver  lesions ? Monthly Sandostatin continued  2. 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 Intermittent abdominal bloating and left-sided abdominal pain- resolved  4.Admission 11/30/2016 with a small bowel obstruction    Disposition: Ms. Brittney [name] appears stable.  She will continue monthly Sandostatin.  She will be scheduled for a restaging Netspot and office visit in 3 months.  We  will follow-up on the chromogranin A level from today.  [name], MD  01/06/2019  11:45 AM

## 2019-01-06 NOTE — Telephone Encounter (Signed)
Scheduled per los. Gave avs and calendar  

## 2019-01-07 LAB — CHROMOGRANIN A: Chromogranin A (ng/mL): 80.3 ng/mL (ref 0.0–101.8)

## 2019-01-11 ENCOUNTER — Telehealth: Payer: Self-pay | Admitting: *Deleted

## 2019-01-11 NOTE — Telephone Encounter (Signed)
Per Dr. Benay Spice called to notify pt that her chromogranin A is unchanged and to f/u as scheduled. Message was left on vmail. Advised if there were any other questions or concerns to call office.

## 2019-01-11 NOTE — Telephone Encounter (Signed)
-----   Message from Ladell Pier, MD sent at 01/07/2019  5:11 PM EST ----- Please call patient, chromogranin A is unchanged, follow-up as scheduled

## 2019-01-26 ENCOUNTER — Encounter: Payer: Self-pay | Admitting: Nurse Practitioner

## 2019-02-03 ENCOUNTER — Other Ambulatory Visit: Payer: Self-pay

## 2019-02-03 ENCOUNTER — Inpatient Hospital Stay: Payer: BC Managed Care – PPO | Attending: Oncology

## 2019-02-03 VITALS — BP 102/78 | HR 62 | Temp 98.2°F | Resp 18

## 2019-02-03 DIAGNOSIS — Z79899 Other long term (current) drug therapy: Secondary | ICD-10-CM | POA: Insufficient documentation

## 2019-02-03 DIAGNOSIS — C7A019 Malignant carcinoid tumor of the small intestine, unspecified portion: Secondary | ICD-10-CM | POA: Insufficient documentation

## 2019-02-03 MED ORDER — OCTREOTIDE ACETATE 30 MG IM KIT
PACK | INTRAMUSCULAR | Status: AC
  Filled 2019-02-03: qty 1

## 2019-02-03 MED ORDER — OCTREOTIDE ACETATE 30 MG IM KIT
30.0000 mg | PACK | Freq: Once | INTRAMUSCULAR | Status: AC
  Administered 2019-02-03: 13:00:00 30 mg via INTRAMUSCULAR

## 2019-02-03 NOTE — Patient Instructions (Signed)
Octreotide injection solution °What is this medicine? °OCTREOTIDE (ok TREE oh tide) is used to reduce blood levels of growth hormone in patients with a condition called acromegaly. This medicine also reduces flushing and watery diarrhea caused by certain types of cancer. °This medicine may be used for other purposes; ask your health care provider or pharmacist if you have questions. °COMMON BRAND NAME(S): Bynfezia, Sandostatin, Sandostatin LAR °What should I tell my health care provider before I take this medicine? °They need to know if you have any of these conditions: °· diabetes °· gallbladder disease °· kidney disease °· liver disease °· an unusual or allergic reaction to octreotide, other medicines, foods, dyes, or preservatives °· pregnant or trying to get pregnant °· breast-feeding °How should I use this medicine? °This medicine is for injection under the skin or into a vein (only in emergency situations). It is usually given by a health care professional in a hospital or clinic setting. °If you get this medicine at home, you will be taught how to prepare and give this medicine. Allow the injection solution to come to room temperature before use. Do not warm it artificially. Use exactly as directed. Take your medicine at regular intervals. Do not take your medicine more often than directed. °It is important that you put your used needles and syringes in a special sharps container. Do not put them in a trash can. If you do not have a sharps container, call your pharmacist or healthcare provider to get one. °Talk to your pediatrician regarding the use of this medicine in children. Special care may be needed. °Overdosage: If you think you have taken too much of this medicine contact a poison control center or emergency room at once. °NOTE: This medicine is only for you. Do not share this medicine with others. °What if I miss a dose? °If you miss a dose, take it as soon as you can. If it is almost time for your  next dose, take only that dose. Do not take double or extra doses. °What may interact with this medicine? °Do not take this medicine with any of the following medications: °· cisapride °· droperidol °· general anesthetics °· grepafloxacin °· perphenazine °· thioridazine °This medicine may also interact with the following medications: °· bromocriptine °· cyclosporine °· diuretics °· medicines for blood pressure, heart disease, irregular heart beat °· medicines for diabetes, including insulin °· quinidine °This list may not describe all possible interactions. Give your health care provider a list of all the medicines, herbs, non-prescription drugs, or dietary supplements you use. Also tell them if you smoke, drink alcohol, or use illegal drugs. Some items may interact with your medicine. °What should I watch for while using this medicine? °Visit your doctor or health care professional for regular checks on your progress. °To help reduce irritation at the injection site, use a different site for each injection and make sure the solution is at room temperature before use. °This medicine may cause decreases in blood sugar. Signs of low blood sugar include chills, cool, pale skin or cold sweats, drowsiness, extreme hunger, fast heartbeat, headache, nausea, nervousness or anxiety, shakiness, trembling, unsteadiness, tiredness, or weakness. Contact your doctor or health care professional right away if you experience any of these symptoms. °This medicine may increase blood sugar. Ask your healthcare provider if changes in diet or medicines are needed if you have diabetes. °This medicine may cause a decrease in vitamin B12. You should make sure that you get enough vitamin B12   while you are taking this medicine. Discuss the foods you eat and the vitamins you take with your health care professional. °What side effects may I notice from receiving this medicine? °Side effects that you should report to your doctor or health care  professional as soon as possible: °· allergic reactions like skin rash, itching or hives, swelling of the face, lips, or tongue °· decreases in blood sugar °· changes in heart rate °· severe stomach pain °·  °signs and symptoms of high blood sugar such as being more thirsty or hungry or having to urinate more than normal. You may also feel very tired or have blurry vision. °Side effects that usually do not require medical attention (report to your doctor or health care professional if they continue or are bothersome): °· diarrhea or constipation °· gas or stomach pain °· nausea, vomiting °· pain, redness, swelling and irritation at site where injected °This list may not describe all possible side effects. Call your doctor for medical advice about side effects. You may report side effects to FDA at 1-800-FDA-1088. °Where should I keep my medicine? °Keep out of the reach of children. °Store in a refrigerator between 2 and 8 degrees C (36 and 46 degrees F). Protect from light. Allow to come to room temperature naturally. Do not use artificial heat. If protected from light, the injection may be stored at room temperature between 20 and 30 degrees C (70 and 86 degrees F) for 14 days. After the initial use, throw away any unused portion of a multiple dose vial after 14 days. Throw away unused portions of the ampules after use. °NOTE: This sheet is a summary. It may not cover all possible information. If you have questions about this medicine, talk to your doctor, pharmacist, or health care provider. °© 2020 [employer] (2017-10-02 08:07:09) ° °

## 2019-03-03 ENCOUNTER — Other Ambulatory Visit: Payer: Self-pay

## 2019-03-03 ENCOUNTER — Inpatient Hospital Stay: Payer: BC Managed Care – PPO | Attending: Oncology

## 2019-03-03 VITALS — BP 101/70 | HR 60 | Temp 98.3°F | Resp 18

## 2019-03-03 DIAGNOSIS — C7B02 Secondary carcinoid tumors of liver: Secondary | ICD-10-CM | POA: Insufficient documentation

## 2019-03-03 DIAGNOSIS — C7A019 Malignant carcinoid tumor of the small intestine, unspecified portion: Secondary | ICD-10-CM | POA: Diagnosis not present

## 2019-03-03 MED ORDER — OCTREOTIDE ACETATE 30 MG IM KIT
PACK | INTRAMUSCULAR | Status: AC
  Filled 2019-03-03: qty 1

## 2019-03-03 MED ORDER — OCTREOTIDE ACETATE 30 MG IM KIT
30.0000 mg | PACK | Freq: Once | INTRAMUSCULAR | Status: AC
  Administered 2019-03-03: 13:00:00 30 mg via INTRAMUSCULAR

## 2019-03-03 NOTE — Patient Instructions (Signed)
Octreotide injection solution °What is this medicine? °OCTREOTIDE (ok TREE oh tide) is used to reduce blood levels of growth hormone in patients with a condition called acromegaly. This medicine also reduces flushing and watery diarrhea caused by certain types of cancer. °This medicine may be used for other purposes; ask your health care provider or pharmacist if you have questions. °COMMON BRAND NAME(S): Bynfezia, Sandostatin, Sandostatin LAR °What should I tell my health care provider before I take this medicine? °They need to know if you have any of these conditions: °· diabetes °· gallbladder disease °· kidney disease °· liver disease °· an unusual or allergic reaction to octreotide, other medicines, foods, dyes, or preservatives °· pregnant or trying to get pregnant °· breast-feeding °How should I use this medicine? °This medicine is for injection under the skin or into a vein (only in emergency situations). It is usually given by a health care professional in a hospital or clinic setting. °If you get this medicine at home, you will be taught how to prepare and give this medicine. Allow the injection solution to come to room temperature before use. Do not warm it artificially. Use exactly as directed. Take your medicine at regular intervals. Do not take your medicine more often than directed. °It is important that you put your used needles and syringes in a special sharps container. Do not put them in a trash can. If you do not have a sharps container, call your pharmacist or healthcare provider to get one. °Talk to your pediatrician regarding the use of this medicine in children. Special care may be needed. °Overdosage: If you think you have taken too much of this medicine contact a poison control center or emergency room at once. °NOTE: This medicine is only for you. Do not share this medicine with others. °What if I miss a dose? °If you miss a dose, take it as soon as you can. If it is almost time for your  next dose, take only that dose. Do not take double or extra doses. °What may interact with this medicine? °Do not take this medicine with any of the following medications: °· cisapride °· droperidol °· general anesthetics °· grepafloxacin °· perphenazine °· thioridazine °This medicine may also interact with the following medications: °· bromocriptine °· cyclosporine °· diuretics °· medicines for blood pressure, heart disease, irregular heart beat °· medicines for diabetes, including insulin °· quinidine °This list may not describe all possible interactions. Give your health care provider a list of all the medicines, herbs, non-prescription drugs, or dietary supplements you use. Also tell them if you smoke, drink alcohol, or use illegal drugs. Some items may interact with your medicine. °What should I watch for while using this medicine? °Visit your doctor or health care professional for regular checks on your progress. °To help reduce irritation at the injection site, use a different site for each injection and make sure the solution is at room temperature before use. °This medicine may cause decreases in blood sugar. Signs of low blood sugar include chills, cool, pale skin or cold sweats, drowsiness, extreme hunger, fast heartbeat, headache, nausea, nervousness or anxiety, shakiness, trembling, unsteadiness, tiredness, or weakness. Contact your doctor or health care professional right away if you experience any of these symptoms. °This medicine may increase blood sugar. Ask your healthcare provider if changes in diet or medicines are needed if you have diabetes. °This medicine may cause a decrease in vitamin B12. You should make sure that you get enough vitamin B12   while you are taking this medicine. Discuss the foods you eat and the vitamins you take with your health care professional. °What side effects may I notice from receiving this medicine? °Side effects that you should report to your doctor or health care  professional as soon as possible: °· allergic reactions like skin rash, itching or hives, swelling of the face, lips, or tongue °· decreases in blood sugar °· changes in heart rate °· severe stomach pain °·  °signs and symptoms of high blood sugar such as being more thirsty or hungry or having to urinate more than normal. You may also feel very tired or have blurry vision. °Side effects that usually do not require medical attention (report to your doctor or health care professional if they continue or are bothersome): °· diarrhea or constipation °· gas or stomach pain °· nausea, vomiting °· pain, redness, swelling and irritation at site where injected °This list may not describe all possible side effects. Call your doctor for medical advice about side effects. You may report side effects to FDA at 1-800-FDA-1088. °Where should I keep my medicine? °Keep out of the reach of children. °Store in a refrigerator between 2 and 8 degrees C (36 and 46 degrees F). Protect from light. Allow to come to room temperature naturally. Do not use artificial heat. If protected from light, the injection may be stored at room temperature between 20 and 30 degrees C (70 and 86 degrees F) for 14 days. After the initial use, throw away any unused portion of a multiple dose vial after 14 days. Throw away unused portions of the ampules after use. °NOTE: This sheet is a summary. It may not cover all possible information. If you have questions about this medicine, talk to your doctor, pharmacist, or health care provider. °© 2020 [employer] (2017-10-02 08:07:09) ° °

## 2019-03-31 ENCOUNTER — Inpatient Hospital Stay: Payer: BC Managed Care – PPO

## 2019-03-31 ENCOUNTER — Telehealth: Payer: Self-pay | Admitting: Oncology

## 2019-03-31 ENCOUNTER — Ambulatory Visit (HOSPITAL_COMMUNITY)
Admission: RE | Admit: 2019-03-31 | Discharge: 2019-03-31 | Disposition: A | Discharge status: Home / Self Care | Payer: BC Managed Care – PPO | Source: Ambulatory Visit | Attending: Oncology | Admitting: Oncology

## 2019-03-31 ENCOUNTER — Inpatient Hospital Stay: Payer: BC Managed Care – PPO | Attending: Oncology | Admitting: Oncology

## 2019-03-31 ENCOUNTER — Other Ambulatory Visit: Payer: Self-pay

## 2019-03-31 VITALS — BP 103/65 | HR 71 | Temp 97.7°F | Resp 17 | Ht 65.0 in | Wt 130.3 lb

## 2019-03-31 DIAGNOSIS — C787 Secondary malignant neoplasm of liver and intrahepatic bile duct: Secondary | ICD-10-CM | POA: Insufficient documentation

## 2019-03-31 DIAGNOSIS — C786 Secondary malignant neoplasm of retroperitoneum and peritoneum: Secondary | ICD-10-CM | POA: Diagnosis not present

## 2019-03-31 DIAGNOSIS — D3A8 Other benign neuroendocrine tumors: Secondary | ICD-10-CM | POA: Insufficient documentation

## 2019-03-31 DIAGNOSIS — C7A019 Malignant carcinoid tumor of the small intestine, unspecified portion: Secondary | ICD-10-CM

## 2019-03-31 DIAGNOSIS — Z79899 Other long term (current) drug therapy: Secondary | ICD-10-CM | POA: Diagnosis not present

## 2019-03-31 MED ORDER — GALLIUM GA 68 DOTATATE IV KIT
3.6000 | PACK | Freq: Once | INTRAVENOUS | Status: AC | PRN
  Administered 2019-03-31: 07:00:00 3.6 via INTRAVENOUS

## 2019-03-31 MED ORDER — OCTREOTIDE ACETATE 30 MG IM KIT
30.0000 mg | PACK | Freq: Once | INTRAMUSCULAR | Status: AC
  Administered 2019-03-31: 30 mg via INTRAMUSCULAR

## 2019-03-31 MED ORDER — OCTREOTIDE ACETATE 30 MG IM KIT
PACK | INTRAMUSCULAR | Status: AC
  Filled 2019-03-31: qty 1

## 2019-03-31 NOTE — Telephone Encounter (Signed)
Scheduled per los. Gave avs and calendar  

## 2019-03-31 NOTE — Patient Instructions (Signed)
Octreotide injection solution What is this medicine? OCTREOTIDE (ok TREE oh tide) is used to reduce blood levels of growth hormone in patients with a condition called acromegaly. This medicine also reduces flushing and watery diarrhea caused by certain types of cancer. This medicine may be used for other purposes; ask your health care provider or pharmacist if you have questions. COMMON BRAND NAME(S): Bynfezia, Sandostatin What should I tell my health care provider before I take this medicine? They need to know if you have any of these conditions:  diabetes  gallbladder disease  kidney disease  liver disease  thyroid disease  an unusual or allergic reaction to octreotide, other medicines, foods, dyes, or preservatives  pregnant or trying to get pregnant  breast-feeding How should I use this medicine? This medicine is for injection under the skin or into a vein (only in emergency situations). It is usually given by a health care professional in a hospital or clinic setting. If you get this medicine at home, you will be taught how to prepare and give this medicine. Allow the injection solution to come to room temperature before use. Do not warm it artificially. Use exactly as directed. Take your medicine at regular intervals. Do not take your medicine more often than directed. It is important that you put your used needles and syringes in a special sharps container. Do not put them in a trash can. If you do not have a sharps container, call your pharmacist or healthcare provider to get one. Talk to your pediatrician regarding the use of this medicine in children. Special care may be needed. Overdosage: If you think you have taken too much of this medicine contact a poison control center or emergency room at once. NOTE: This medicine is only for you. Do not share this medicine with others. What if I miss a dose? If you miss a dose, take it as soon as you can. If it is almost time for your  next dose, take only that dose. Do not take double or extra doses. What may interact with this medicine?  bromocriptine  certain medicines for blood pressure, heart disease, irregular heartbeat  cyclosporine  diuretics  medicines for diabetes, including insulin  quinidine This list may not describe all possible interactions. Give your health care provider a list of all the medicines, herbs, non-prescription drugs, or dietary supplements you use. Also tell them if you smoke, drink alcohol, or use illegal drugs. Some items may interact with your medicine. What should I watch for while using this medicine? Visit your doctor or health care professional for regular checks on your progress. To help reduce irritation at the injection site, use a different site for each injection and make sure the solution is at room temperature before use. This medicine may cause decreases in blood sugar. Signs of low blood sugar include chills, cool, pale skin or cold sweats, drowsiness, extreme hunger, fast heartbeat, headache, nausea, nervousness or anxiety, shakiness, trembling, unsteadiness, tiredness, or weakness. Contact your doctor or health care professional right away if you experience any of these symptoms. This medicine may increase blood sugar. Ask your healthcare provider if changes in diet or medicines are needed if you have diabetes. This medicine may cause a decrease in vitamin B12. You should make sure that you get enough vitamin B12 while you are taking this medicine. Discuss the foods you eat and the vitamins you take with your health care professional. What side effects may I notice from receiving this medicine? Side   effects that you should report to your doctor or health care professional as soon as possible:  allergic reactions like skin rash, itching or hives, swelling of the face, lips, or tongue  fast, slow, or irregular heartbeat  right upper belly pain  severe stomach pain  signs  and symptoms of high blood sugar such as being more thirsty or hungry or having to urinate more than normal. You may also feel very tired or have blurry vision.  signs and symptoms of low blood sugar such as feeling anxious; confusion; dizziness; increased hunger; unusually weak or tired; increased sweating; shakiness; cold, clammy skin; irritable; headache; blurred vision; fast heartbeat; loss of consciousness  unusually weak or tired Side effects that usually do not require medical attention (report to your doctor or health care professional if they continue or are bothersome):  diarrhea  dizziness  gas  headache  nausea, vomiting  pain, redness, or irritation at site where injected  upset stomach This list may not describe all possible side effects. Call your doctor for medical advice about side effects. You may report side effects to FDA at 1-800-FDA-1088. Where should I keep my medicine? Keep out of the reach of children. Store in a refrigerator between 2 and 8 degrees C (36 and 46 degrees F). Protect from light. Allow to come to room temperature naturally. Do not use artificial heat. If protected from light, the injection may be stored at room temperature between 20 and 30 degrees C (70 and 86 degrees F) for 14 days. After the initial use, throw away any unused portion of a multiple dose vial after 14 days. Throw away unused portions of the ampules after use. NOTE: This sheet is a summary. It may not cover all possible information. If you have questions about this medicine, talk to your doctor, pharmacist, or health care provider.  2020 Elsevier/Gold Standard (2018-07-23 13:33:09)  

## 2019-03-31 NOTE — Progress Notes (Signed)
[name] OFFICE PROGRESS NOTE   Diagnosis: Carcinoid tumor  INTERVAL HISTORY:   Brittney [name] continues monthly Sandostatin.  No flushing or diarrhea.  She has intermittent left abdominal pain.  She relates this to physical activity.  She continues exercise.  No new complaint.  She is scheduled for the Covid 19 vaccine next week.  Objective:  Vital signs in last 24 hours:  Blood pressure 103/65, pulse 71, temperature 97.7 F (36.5 C), temperature source Temporal, resp. rate 17, height 5\' 5"  (1.651 m), weight 130 lb 4.8 oz (59.1 kg), last menstrual period 01/31/2014, SpO2 100 %.    Lymphatics: No cervical, supraclavicular, axillary, or inguinal nodes GI: No hepatosplenomegaly, no mass, nontender Vascular: No leg edema   Lab Results:  Lab Results  Component Value Date   WBC 3.5 (L) 06/23/2018   HGB 13.6 06/23/2018   HCT 41.1 06/23/2018   MCV 95.4 06/23/2018   PLT 168 06/23/2018   NEUTROABS 1.8 06/23/2018    CMP  Lab Results  Component Value Date   NA 142 01/06/2019   K 4.1 01/06/2019   CL 102 01/06/2019   CO2 29 01/06/2019   GLUCOSE 103 (H) 01/06/2019   BUN 16 01/06/2019   CREATININE 0.79 01/06/2019   CALCIUM 9.1 01/06/2019   PROT 7.4 01/06/2019   ALBUMIN 4.3 01/06/2019   AST 18 01/06/2019   ALT 10 01/06/2019   ALKPHOS 64 01/06/2019   BILITOT 0.6 01/06/2019   GFRNONAA >60 01/06/2019   GFRAA >60 01/06/2019    Imaging:  NM PET (NETSPOT GA 68 DOTATATE) SKULL BASE TO MID THIGH  Result Date: 03/31/2019 CLINICAL DATA:  Well differentiated neuroendocrine tumor with liver metastasis. Patient status post peptide receptor radiotherapy completed 11/01/2017. EXAM: NUCLEAR MEDICINE PET SKULL BASE TO THIGH TECHNIQUE: 3.6 mCi Ga 40 DOTATATE was injected intravenously. Full-ring PET imaging was performed from the skull base to thigh after the radiotracer. CT data was obtained and used for attenuation correction and anatomic localization. COMPARISON:  None.  FINDINGS: NECK No radiotracer activity in neck lymph nodes. Incidental CT findings: None CHEST No radiotracer accumulation within mediastinal or hilar lymph nodes. No suspicious pulmonary nodules on the CT scan. Incidental CT finding:None ABDOMEN/PELVIS Multiple round lesions within both hepatic lobes with intense radiotracer activity. For example lesion in the LEFT lateral hepatic lobe measuring 2.1 cm with SUV max equal 18.2 compares to 2.3 cm on prior with SUV max equal 19.9. Example lesion in the RIGHT hepatic lobe with central necrosis measures 2.9 cm (image 91) with SUV max equal 12.2 compared compared with 2.8 cm lesion with SUV max equal 13.8. Smaller lesion the central LEFT hepatic lobe measuring 16 mm (image 111/4) with SUV max equal 12.4 compares with 15 mm lesion with SUV max equal 13.7. No new lesions are identified. No abnormal activity in the pancreas. No focal activity associated with the bowel. Evidence of prior small-bowel resection RIGHT lower quadrant. There is a nodule in the deep RIGHT pelvis which is difficult to define on the CT portion exam as mild radiotracer activity SUV max equal 4.5 compared SUV max equal 5.7. Physiologic activity noted in the liver, spleen, adrenal glands and kidneys. Incidental CT findings:None SKELETON No focal activity to suggest skeletal metastasis. Incidental CT findings:Again demonstrated multiple foci of endplate sclerosis in the midthoracic spine without radiotracer activity. Findings similar comparison exam. IMPRESSION: 1. Essentially stable exam. No new hepatic lesions. Several hepatic lesions demonstrate mild decrease in radiotracer activity. No significant change in lesion  size. Overall stable hepatic metastasis without evidence of significant progression. 2. Small peritoneal implant in the deep LEFT pelvis with a similar to slightly decreased radiotracer activity. 3. No evidence of new or current activity in the bowel. 4. No evidence distant metastatic  disease outside of the abdomen. Electronically Signed   By: Suzy Bouchard M.D.   On: 03/31/2019 09:42    Medications: I have reviewed the patient's current medications.   Assessment/Plan:  1. Metastatic neuroendocrine tumor ? MRI of the abdomen 10/02/2016 confirmed multiple enhancing liver lesions consistent with metastases, no primary tumor site identified ? Ultrasound-guided biopsy of a left liver lesion 10/07/2016-neuroendocrine neoplasm, "intermediate grade ", review of pathology at GI tumor conference consistent with a low-grade carcinoid tumor ? Elevated chromogranin A 10/15/2016 ? gallium-DOTATATEscan 10/23/2016-multiple foci of liver metastases, enlarged central mesenteric lymph nodes, short intussusception's in adjacent small bowel felt to represent a primary small bowel tumor, additional mesenteric lymph nodes with metabolic activity, deep right pelvic nodule consistent with a pelvic metastasis ? Small bowel resection 11/21/2016-mid ileum mass, low-grade neuroendocrine tumor,pT4,pN2, 6/18 lymph nodes positive, positive mesenteric resection margin (lymph node), intramural satellite nodule ? CT 11/30/2016-small bowel obstruction with transition point adjacent to suture line at the distal small bowel, liver metastases similar to the 10/02/2016 MRI ? Recurrent obstructive symptoms requiring repeat surgery with resection of the small bowel anastomosis site 12/13/2016, pathology negative for malignancy ? Initiation of monthly Sandostatin 12/20/2016 ? CT abdomen/pelvis 02/19/2017-mild enlargement of liver lesions compared to November 2018 ? Cycle 1 Lutathera 06/17/2017 ? Cycle 2 Lutathera 08/13/2017 ? Cycle 3 Lutathera 10/07/2017 ? Cycle 4 Lutathera 12/02/2017 ? Dotatate scan 12/29/2017-no evidence of new or metastatic disease or progression.  Multiple hepatic metastasis.  Lesions have decreased in size from the most recent CT scan and slightly increased in radiotracer activity from dotatate  PET scan 10/23/2016.  Interval resection of small bowel tumor and bulky mesenteric metastasis.  No residual activity in the bowel or mesentery.  Single small focus of activity in the deep right pelvis along the peritoneal surface not changed from comparison exam. ? Monthly Sandostatin continued ? Netspot 06/23/2018- mild decrease in radiotracer activity of all hepatic metastases, no new lesions, slight enlargement of several liver lesions ? Monthly Sandostatin continued ? Netspot 03/31/2019-no new liver lesions, no change in the size of liver lesions with a mild decrease in tracer activity involving several lesions, stable right pelvic peritoneal implant ? Monthly Sandostatin continued  2. 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 Intermittent abdominal bloating and left-sided abdominal pain- resolved  4.Admission 11/30/2016 with a small bowel obstruction    Disposition: Brittney [name] appears unchanged.  The restaging Netspot reveals no evidence of disease progression.  I reviewed the PET and CT images with her.  The plan is to continue monthly Sandostatin.  We will schedule another Netspot in approximately 9 months.  We will follow up on the chromogranin A level from today.  She will be scheduled for an office visit in 4 months.  She has a history of COVID-19 infection.  She will consult with the CDC and vaccine administration site regarding recommendations for vaccination.  [name], MD  03/31/2019  10:11 AM

## 2019-04-01 LAB — CHROMOGRANIN A: Chromogranin A (ng/mL): 82.1 ng/mL (ref 0.0–101.8)

## 2019-04-28 ENCOUNTER — Other Ambulatory Visit: Payer: Self-pay

## 2019-04-28 ENCOUNTER — Inpatient Hospital Stay: Payer: BC Managed Care – PPO | Attending: Oncology

## 2019-04-28 VITALS — BP 105/70 | HR 74 | Temp 98.7°F | Resp 18

## 2019-04-28 DIAGNOSIS — C7A019 Malignant carcinoid tumor of the small intestine, unspecified portion: Secondary | ICD-10-CM

## 2019-04-28 DIAGNOSIS — Z79899 Other long term (current) drug therapy: Secondary | ICD-10-CM | POA: Diagnosis not present

## 2019-04-28 MED ORDER — OCTREOTIDE ACETATE 30 MG IM KIT
PACK | INTRAMUSCULAR | Status: AC
  Filled 2019-04-28: qty 1

## 2019-04-28 MED ORDER — OCTREOTIDE ACETATE 30 MG IM KIT
30.0000 mg | PACK | Freq: Once | INTRAMUSCULAR | Status: AC
  Administered 2019-04-28: 30 mg via INTRAMUSCULAR

## 2019-04-28 NOTE — Patient Instructions (Signed)
Octreotide injection solution What is this medicine? OCTREOTIDE (ok TREE oh tide) is used to reduce blood levels of growth hormone in patients with a condition called acromegaly. This medicine also reduces flushing and watery diarrhea caused by certain types of cancer. This medicine may be used for other purposes; ask your health care provider or pharmacist if you have questions. COMMON BRAND NAME(S): Bynfezia, Sandostatin What should I tell my health care provider before I take this medicine? They need to know if you have any of these conditions:  diabetes  gallbladder disease  kidney disease  liver disease  thyroid disease  an unusual or allergic reaction to octreotide, other medicines, foods, dyes, or preservatives  pregnant or trying to get pregnant  breast-feeding How should I use this medicine? This medicine is for injection under the skin or into a vein (only in emergency situations). It is usually given by a health care professional in a hospital or clinic setting. If you get this medicine at home, you will be taught how to prepare and give this medicine. Allow the injection solution to come to room temperature before use. Do not warm it artificially. Use exactly as directed. Take your medicine at regular intervals. Do not take your medicine more often than directed. It is important that you put your used needles and syringes in a special sharps container. Do not put them in a trash can. If you do not have a sharps container, call your pharmacist or healthcare provider to get one. Talk to your pediatrician regarding the use of this medicine in children. Special care may be needed. Overdosage: If you think you have taken too much of this medicine contact a poison control center or emergency room at once. NOTE: This medicine is only for you. Do not share this medicine with others. What if I miss a dose? If you miss a dose, take it as soon as you can. If it is almost time for your  next dose, take only that dose. Do not take double or extra doses. What may interact with this medicine?  bromocriptine  certain medicines for blood pressure, heart disease, irregular heartbeat  cyclosporine  diuretics  medicines for diabetes, including insulin  quinidine This list may not describe all possible interactions. Give your health care provider a list of all the medicines, herbs, non-prescription drugs, or dietary supplements you use. Also tell them if you smoke, drink alcohol, or use illegal drugs. Some items may interact with your medicine. What should I watch for while using this medicine? Visit your doctor or health care professional for regular checks on your progress. To help reduce irritation at the injection site, use a different site for each injection and make sure the solution is at room temperature before use. This medicine may cause decreases in blood sugar. Signs of low blood sugar include chills, cool, pale skin or cold sweats, drowsiness, extreme hunger, fast heartbeat, headache, nausea, nervousness or anxiety, shakiness, trembling, unsteadiness, tiredness, or weakness. Contact your doctor or health care professional right away if you experience any of these symptoms. This medicine may increase blood sugar. Ask your healthcare provider if changes in diet or medicines are needed if you have diabetes. This medicine may cause a decrease in vitamin B12. You should make sure that you get enough vitamin B12 while you are taking this medicine. Discuss the foods you eat and the vitamins you take with your health care professional. What side effects may I notice from receiving this medicine? Side   effects that you should report to your doctor or health care professional as soon as possible:  allergic reactions like skin rash, itching or hives, swelling of the face, lips, or tongue  fast, slow, or irregular heartbeat  right upper belly pain  severe stomach pain  signs  and symptoms of high blood sugar such as being more thirsty or hungry or having to urinate more than normal. You may also feel very tired or have blurry vision.  signs and symptoms of low blood sugar such as feeling anxious; confusion; dizziness; increased hunger; unusually weak or tired; increased sweating; shakiness; cold, clammy skin; irritable; headache; blurred vision; fast heartbeat; loss of consciousness  unusually weak or tired Side effects that usually do not require medical attention (report to your doctor or health care professional if they continue or are bothersome):  diarrhea  dizziness  gas  headache  nausea, vomiting  pain, redness, or irritation at site where injected  upset stomach This list may not describe all possible side effects. Call your doctor for medical advice about side effects. You may report side effects to FDA at 1-800-FDA-1088. Where should I keep my medicine? Keep out of the reach of children. Store in a refrigerator between 2 and 8 degrees C (36 and 46 degrees F). Protect from light. Allow to come to room temperature naturally. Do not use artificial heat. If protected from light, the injection may be stored at room temperature between 20 and 30 degrees C (70 and 86 degrees F) for 14 days. After the initial use, throw away any unused portion of a multiple dose vial after 14 days. Throw away unused portions of the ampules after use. NOTE: This sheet is a summary. It may not cover all possible information. If you have questions about this medicine, talk to your doctor, pharmacist, or health care provider.  2020 Elsevier/Gold Standard (2018-07-23 13:33:09)  

## 2019-04-28 NOTE — Progress Notes (Signed)
Injection given by Johnston Ebbs, LPN

## 2019-05-24 ENCOUNTER — Encounter: Payer: Self-pay | Admitting: Oncology

## 2019-05-26 ENCOUNTER — Ambulatory Visit: Payer: BC Managed Care – PPO

## 2019-05-26 ENCOUNTER — Inpatient Hospital Stay: Payer: BC Managed Care – PPO | Attending: Oncology

## 2019-05-26 ENCOUNTER — Other Ambulatory Visit: Payer: Self-pay

## 2019-05-26 VITALS — BP 117/72 | HR 60 | Temp 98.0°F | Resp 18

## 2019-05-26 DIAGNOSIS — Z79899 Other long term (current) drug therapy: Secondary | ICD-10-CM | POA: Diagnosis not present

## 2019-05-26 DIAGNOSIS — C7A019 Malignant carcinoid tumor of the small intestine, unspecified portion: Secondary | ICD-10-CM | POA: Insufficient documentation

## 2019-05-26 MED ORDER — OCTREOTIDE ACETATE 30 MG IM KIT
30.0000 mg | PACK | Freq: Once | INTRAMUSCULAR | Status: AC
  Administered 2019-05-26: 30 mg via INTRAMUSCULAR

## 2019-06-23 ENCOUNTER — Inpatient Hospital Stay: Payer: BC Managed Care – PPO | Attending: Oncology

## 2019-06-23 ENCOUNTER — Inpatient Hospital Stay: Payer: BC Managed Care – PPO

## 2019-06-23 ENCOUNTER — Other Ambulatory Visit: Payer: Self-pay

## 2019-06-23 VITALS — BP 102/71 | HR 70 | Temp 98.0°F | Resp 18

## 2019-06-23 DIAGNOSIS — Z79899 Other long term (current) drug therapy: Secondary | ICD-10-CM | POA: Insufficient documentation

## 2019-06-23 DIAGNOSIS — C7A019 Malignant carcinoid tumor of the small intestine, unspecified portion: Secondary | ICD-10-CM

## 2019-06-23 MED ORDER — OCTREOTIDE ACETATE 30 MG IM KIT
30.0000 mg | PACK | Freq: Once | INTRAMUSCULAR | Status: AC
  Administered 2019-06-23: 30 mg via INTRAMUSCULAR

## 2019-06-23 MED ORDER — OCTREOTIDE ACETATE 30 MG IM KIT
PACK | INTRAMUSCULAR | Status: AC
  Filled 2019-06-23: qty 1

## 2019-06-23 NOTE — Patient Instructions (Signed)
Octreotide injection solution What is this medicine? OCTREOTIDE (ok TREE oh tide) is used to reduce blood levels of growth hormone in patients with a condition called acromegaly. This medicine also reduces flushing and watery diarrhea caused by certain types of cancer. This medicine may be used for other purposes; ask your health care provider or pharmacist if you have questions. COMMON BRAND NAME(S): Bynfezia, Sandostatin What should I tell my health care provider before I take this medicine? They need to know if you have any of these conditions:  diabetes  gallbladder disease  kidney disease  liver disease  thyroid disease  an unusual or allergic reaction to octreotide, other medicines, foods, dyes, or preservatives  pregnant or trying to get pregnant  breast-feeding How should I use this medicine? This medicine is for injection under the skin or into a vein (only in emergency situations). It is usually given by a health care professional in a hospital or clinic setting. If you get this medicine at home, you will be taught how to prepare and give this medicine. Allow the injection solution to come to room temperature before use. Do not warm it artificially. Use exactly as directed. Take your medicine at regular intervals. Do not take your medicine more often than directed. It is important that you put your used needles and syringes in a special sharps container. Do not put them in a trash can. If you do not have a sharps container, call your pharmacist or healthcare provider to get one. Talk to your pediatrician regarding the use of this medicine in children. Special care may be needed. Overdosage: If you think you have taken too much of this medicine contact a poison control center or emergency room at once. NOTE: This medicine is only for you. Do not share this medicine with others. What if I miss a dose? If you miss a dose, take it as soon as you can. If it is almost time for your  next dose, take only that dose. Do not take double or extra doses. What may interact with this medicine?  bromocriptine  certain medicines for blood pressure, heart disease, irregular heartbeat  cyclosporine  diuretics  medicines for diabetes, including insulin  quinidine This list may not describe all possible interactions. Give your health care provider a list of all the medicines, herbs, non-prescription drugs, or dietary supplements you use. Also tell them if you smoke, drink alcohol, or use illegal drugs. Some items may interact with your medicine. What should I watch for while using this medicine? Visit your doctor or health care professional for regular checks on your progress. To help reduce irritation at the injection site, use a different site for each injection and make sure the solution is at room temperature before use. This medicine may cause decreases in blood sugar. Signs of low blood sugar include chills, cool, pale skin or cold sweats, drowsiness, extreme hunger, fast heartbeat, headache, nausea, nervousness or anxiety, shakiness, trembling, unsteadiness, tiredness, or weakness. Contact your doctor or health care professional right away if you experience any of these symptoms. This medicine may increase blood sugar. Ask your healthcare provider if changes in diet or medicines are needed if you have diabetes. This medicine may cause a decrease in vitamin B12. You should make sure that you get enough vitamin B12 while you are taking this medicine. Discuss the foods you eat and the vitamins you take with your health care professional. What side effects may I notice from receiving this medicine? Side   effects that you should report to your doctor or health care professional as soon as possible:  allergic reactions like skin rash, itching or hives, swelling of the face, lips, or tongue  fast, slow, or irregular heartbeat  right upper belly pain  severe stomach pain  signs  and symptoms of high blood sugar such as being more thirsty or hungry or having to urinate more than normal. You may also feel very tired or have blurry vision.  signs and symptoms of low blood sugar such as feeling anxious; confusion; dizziness; increased hunger; unusually weak or tired; increased sweating; shakiness; cold, clammy skin; irritable; headache; blurred vision; fast heartbeat; loss of consciousness  unusually weak or tired Side effects that usually do not require medical attention (report to your doctor or health care professional if they continue or are bothersome):  diarrhea  dizziness  gas  headache  nausea, vomiting  pain, redness, or irritation at site where injected  upset stomach This list may not describe all possible side effects. Call your doctor for medical advice about side effects. You may report side effects to FDA at 1-800-FDA-1088. Where should I keep my medicine? Keep out of the reach of children. Store in a refrigerator between 2 and 8 degrees C (36 and 46 degrees F). Protect from light. Allow to come to room temperature naturally. Do not use artificial heat. If protected from light, the injection may be stored at room temperature between 20 and 30 degrees C (70 and 86 degrees F) for 14 days. After the initial use, throw away any unused portion of a multiple dose vial after 14 days. Throw away unused portions of the ampules after use. NOTE: This sheet is a summary. It may not cover all possible information. If you have questions about this medicine, talk to your doctor, pharmacist, or health care provider.  2020 Elsevier/Gold Standard (2018-07-23 13:33:09)  

## 2019-07-21 ENCOUNTER — Telehealth: Payer: Self-pay | Admitting: Oncology

## 2019-07-21 ENCOUNTER — Inpatient Hospital Stay: Payer: BC Managed Care – PPO | Attending: Oncology | Admitting: Oncology

## 2019-07-21 ENCOUNTER — Inpatient Hospital Stay: Payer: BC Managed Care – PPO

## 2019-07-21 ENCOUNTER — Other Ambulatory Visit: Payer: Self-pay

## 2019-07-21 VITALS — BP 102/76 | HR 55 | Temp 97.6°F | Resp 18 | Ht 65.0 in | Wt 129.5 lb

## 2019-07-21 DIAGNOSIS — C786 Secondary malignant neoplasm of retroperitoneum and peritoneum: Secondary | ICD-10-CM | POA: Diagnosis not present

## 2019-07-21 DIAGNOSIS — Z79899 Other long term (current) drug therapy: Secondary | ICD-10-CM | POA: Insufficient documentation

## 2019-07-21 DIAGNOSIS — C7A019 Malignant carcinoid tumor of the small intestine, unspecified portion: Secondary | ICD-10-CM

## 2019-07-21 DIAGNOSIS — C787 Secondary malignant neoplasm of liver and intrahepatic bile duct: Secondary | ICD-10-CM | POA: Diagnosis not present

## 2019-07-21 MED ORDER — OCTREOTIDE ACETATE 30 MG IM KIT
PACK | INTRAMUSCULAR | Status: AC
  Filled 2019-07-21: qty 1

## 2019-07-21 MED ORDER — OCTREOTIDE ACETATE 30 MG IM KIT
30.0000 mg | PACK | Freq: Once | INTRAMUSCULAR | Status: AC
  Administered 2019-07-21: 30 mg via INTRAMUSCULAR

## 2019-07-21 MED ORDER — OCTREOTIDE ACETATE 30 MG IM KIT: 30.0000 mg | PACK | Freq: Once | INTRAMUSCULAR | Status: AC

## 2019-07-21 NOTE — Telephone Encounter (Signed)
Scheduled per 07/14 los, patient has received updated calender. 

## 2019-07-21 NOTE — Patient Instructions (Signed)
Octreotide injection solution What is this medicine? OCTREOTIDE (ok TREE oh tide) is used to reduce blood levels of growth hormone in patients with a condition called acromegaly. This medicine also reduces flushing and watery diarrhea caused by certain types of cancer. This medicine may be used for other purposes; ask your health care provider or pharmacist if you have questions. COMMON BRAND NAME(S): Bynfezia, Sandostatin What should I tell my health care provider before I take this medicine? They need to know if you have any of these conditions:  diabetes  gallbladder disease  kidney disease  liver disease  thyroid disease  an unusual or allergic reaction to octreotide, other medicines, foods, dyes, or preservatives  pregnant or trying to get pregnant  breast-feeding How should I use this medicine? This medicine is for injection under the skin or into a vein (only in emergency situations). It is usually given by a health care professional in a hospital or clinic setting. If you get this medicine at home, you will be taught how to prepare and give this medicine. Allow the injection solution to come to room temperature before use. Do not warm it artificially. Use exactly as directed. Take your medicine at regular intervals. Do not take your medicine more often than directed. It is important that you put your used needles and syringes in a special sharps container. Do not put them in a trash can. If you do not have a sharps container, call your pharmacist or healthcare provider to get one. Talk to your pediatrician regarding the use of this medicine in children. Special care may be needed. Overdosage: If you think you have taken too much of this medicine contact a poison control center or emergency room at once. NOTE: This medicine is only for you. Do not share this medicine with others. What if I miss a dose? If you miss a dose, take it as soon as you can. If it is almost time for your  next dose, take only that dose. Do not take double or extra doses. What may interact with this medicine?  bromocriptine  certain medicines for blood pressure, heart disease, irregular heartbeat  cyclosporine  diuretics  medicines for diabetes, including insulin  quinidine This list may not describe all possible interactions. Give your health care provider a list of all the medicines, herbs, non-prescription drugs, or dietary supplements you use. Also tell them if you smoke, drink alcohol, or use illegal drugs. Some items may interact with your medicine. What should I watch for while using this medicine? Visit your doctor or health care professional for regular checks on your progress. To help reduce irritation at the injection site, use a different site for each injection and make sure the solution is at room temperature before use. This medicine may cause decreases in blood sugar. Signs of low blood sugar include chills, cool, pale skin or cold sweats, drowsiness, extreme hunger, fast heartbeat, headache, nausea, nervousness or anxiety, shakiness, trembling, unsteadiness, tiredness, or weakness. Contact your doctor or health care professional right away if you experience any of these symptoms. This medicine may increase blood sugar. Ask your healthcare provider if changes in diet or medicines are needed if you have diabetes. This medicine may cause a decrease in vitamin B12. You should make sure that you get enough vitamin B12 while you are taking this medicine. Discuss the foods you eat and the vitamins you take with your health care professional. What side effects may I notice from receiving this medicine? Side   effects that you should report to your doctor or health care professional as soon as possible:  allergic reactions like skin rash, itching or hives, swelling of the face, lips, or tongue  fast, slow, or irregular heartbeat  right upper belly pain  severe stomach pain  signs  and symptoms of high blood sugar such as being more thirsty or hungry or having to urinate more than normal. You may also feel very tired or have blurry vision.  signs and symptoms of low blood sugar such as feeling anxious; confusion; dizziness; increased hunger; unusually weak or tired; increased sweating; shakiness; cold, clammy skin; irritable; headache; blurred vision; fast heartbeat; loss of consciousness  unusually weak or tired Side effects that usually do not require medical attention (report to your doctor or health care professional if they continue or are bothersome):  diarrhea  dizziness  gas  headache  nausea, vomiting  pain, redness, or irritation at site where injected  upset stomach This list may not describe all possible side effects. Call your doctor for medical advice about side effects. You may report side effects to FDA at 1-800-FDA-1088. Where should I keep my medicine? Keep out of the reach of children. Store in a refrigerator between 2 and 8 degrees C (36 and 46 degrees F). Protect from light. Allow to come to room temperature naturally. Do not use artificial heat. If protected from light, the injection may be stored at room temperature between 20 and 30 degrees C (70 and 86 degrees F) for 14 days. After the initial use, throw away any unused portion of a multiple dose vial after 14 days. Throw away unused portions of the ampules after use. NOTE: This sheet is a summary. It may not cover all possible information. If you have questions about this medicine, talk to your doctor, pharmacist, or health care provider.  2020 Elsevier/Gold Standard (2018-07-23 13:33:09)  

## 2019-07-21 NOTE — Progress Notes (Signed)
[place] OFFICE PROGRESS NOTE   Diagnosis: Carcinoid tumor  INTERVAL HISTORY:   Brittney [name] returns as scheduled.  She feels well.  She is exercising.  She has multiple bowel movements per day.  She continues Metamucil.  Occasional "hemorrhoid "bleeding.  She did not feel well during the month of May.  She had mild nausea and malaise.  The symptoms have resolved.  She received the Covid 19 vaccine in late April/early May.  No fever.  Objective:  Vital signs in last 24 hours:  Blood pressure 102/76, pulse (!) 55, temperature 97.6 F (36.4 C), temperature source Temporal, resp. rate 18, height 5\' 5"  (1.651 m), weight 129 lb 8 oz (58.7 kg), last menstrual period 01/31/2014, SpO2 100 %.     Resp: Lungs clear bilaterally Cardio: Regular rate and rhythm GI: No hepatosplenomegaly, no mass, nontender Vascular: No leg edema   Lab Results:  Chromogranin A on 03/31/2019: 82.1  Medications: I have reviewed the patient's current medications.   Assessment/Plan: 1. Metastatic neuroendocrine tumor ? MRI of the abdomen 10/02/2016 confirmed multiple enhancing liver lesions consistent with metastases, no primary tumor site identified ? Ultrasound-guided biopsy of a left liver lesion 10/07/2016-neuroendocrine neoplasm, "intermediate grade ", review of pathology at GI tumor conference consistent with a low-grade carcinoid tumor ? Elevated chromogranin A 10/15/2016 ? gallium-DOTATATEscan 10/23/2016-multiple foci of liver metastases, enlarged central mesenteric lymph nodes, short intussusception's in adjacent small bowel felt to represent a primary small bowel tumor, additional mesenteric lymph nodes with metabolic activity, deep right pelvic nodule consistent with a pelvic metastasis ? Small bowel resection 11/21/2016-mid ileum mass, low-grade neuroendocrine tumor,pT4,pN2, 6/18 lymph nodes positive, positive mesenteric resection margin (lymph node), intramural satellite  nodule ? CT 11/30/2016-small bowel obstruction with transition point adjacent to suture line at the distal small bowel, liver metastases similar to the 10/02/2016 MRI ? Recurrent obstructive symptoms requiring repeat surgery with resection of the small bowel anastomosis site 12/13/2016, pathology negative for malignancy ? Initiation of monthly Sandostatin 12/20/2016 ? CT abdomen/pelvis 02/19/2017-mild enlargement of liver lesions compared to November 2018 ? Cycle 1 Lutathera 06/17/2017 ? Cycle 2 Lutathera 08/13/2017 ? Cycle 3 Lutathera 10/07/2017 ? Cycle 4 Lutathera 12/02/2017 ? Dotatate scan 12/29/2017-no evidence of new or metastatic disease or progression.  Multiple hepatic metastasis.  Lesions have decreased in size from the most recent CT scan and slightly increased in radiotracer activity from dotatate PET scan 10/23/2016.  Interval resection of small bowel tumor and bulky mesenteric metastasis.  No residual activity in the bowel or mesentery.  Single small focus of activity in the deep right pelvis along the peritoneal surface not changed from comparison exam. ? Monthly Sandostatin continued ? Netspot 06/23/2018- mild decrease in radiotracer activity of all hepatic metastases, no new lesions, slight enlargement of several liver lesions ? Monthly Sandostatin continued ? Netspot 03/31/2019-no new liver lesions, no change in the size of liver lesions with a mild decrease in tracer activity involving several lesions, stable right pelvic peritoneal implant ? Monthly Sandostatin continued  2. 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 Intermittent abdominal bloating and left-sided abdominal pain- resolved  4.Admission 11/30/2016 with a small bowel obstruction      Disposition: Brittney [name] appears stable.  The plan is to continue monthly Sandostatin.  She will return for an office visit and repeat Netspot on  12/07/2019.  The etiology of her symptoms during the month of May is unclear.  This may have  been related to the Covid vaccine, adhesions, the carcinoid tumor, or another etiology.  There is no clinical evidence for progression of the carcinoid tumor.  [name], MD  07/21/2019  11:41 AM

## 2019-07-22 LAB — CHROMOGRANIN A: Chromogranin A (ng/mL): 74.4 ng/mL (ref 0.0–101.8)

## 2019-08-19 ENCOUNTER — Inpatient Hospital Stay: Payer: BC Managed Care – PPO | Attending: Oncology

## 2019-08-19 ENCOUNTER — Other Ambulatory Visit: Payer: Self-pay

## 2019-08-19 VITALS — BP 109/78 | HR 52 | Temp 98.2°F | Resp 16

## 2019-08-19 DIAGNOSIS — C7A019 Malignant carcinoid tumor of the small intestine, unspecified portion: Secondary | ICD-10-CM

## 2019-08-19 DIAGNOSIS — Z79899 Other long term (current) drug therapy: Secondary | ICD-10-CM | POA: Insufficient documentation

## 2019-08-19 MED ORDER — OCTREOTIDE ACETATE 30 MG IM KIT
30.0000 mg | PACK | Freq: Once | INTRAMUSCULAR | Status: AC
  Administered 2019-08-19: 30 mg via INTRAMUSCULAR

## 2019-08-19 MED ORDER — OCTREOTIDE ACETATE 30 MG IM KIT
PACK | INTRAMUSCULAR | Status: AC
  Filled 2019-08-19: qty 1

## 2019-08-19 NOTE — Patient Instructions (Signed)
Octreotide injection solution What is this medicine? OCTREOTIDE (ok TREE oh tide) is used to reduce blood levels of growth hormone in patients with a condition called acromegaly. This medicine also reduces flushing and watery diarrhea caused by certain types of cancer. This medicine may be used for other purposes; ask your health care provider or pharmacist if you have questions. COMMON BRAND NAME(S): Bynfezia, Sandostatin What should I tell my health care provider before I take this medicine? They need to know if you have any of these conditions:  diabetes  gallbladder disease  kidney disease  liver disease  thyroid disease  an unusual or allergic reaction to octreotide, other medicines, foods, dyes, or preservatives  pregnant or trying to get pregnant  breast-feeding How should I use this medicine? This medicine is for injection under the skin or into a vein (only in emergency situations). It is usually given by a health care professional in a hospital or clinic setting. If you get this medicine at home, you will be taught how to prepare and give this medicine. Allow the injection solution to come to room temperature before use. Do not warm it artificially. Use exactly as directed. Take your medicine at regular intervals. Do not take your medicine more often than directed. It is important that you put your used needles and syringes in a special sharps container. Do not put them in a trash can. If you do not have a sharps container, call your pharmacist or healthcare provider to get one. Talk to your pediatrician regarding the use of this medicine in children. Special care may be needed. Overdosage: If you think you have taken too much of this medicine contact a poison control center or emergency room at once. NOTE: This medicine is only for you. Do not share this medicine with others. What if I miss a dose? If you miss a dose, take it as soon as you can. If it is almost time for your  next dose, take only that dose. Do not take double or extra doses. What may interact with this medicine?  bromocriptine  certain medicines for blood pressure, heart disease, irregular heartbeat  cyclosporine  diuretics  medicines for diabetes, including insulin  quinidine This list may not describe all possible interactions. Give your health care provider a list of all the medicines, herbs, non-prescription drugs, or dietary supplements you use. Also tell them if you smoke, drink alcohol, or use illegal drugs. Some items may interact with your medicine. What should I watch for while using this medicine? Visit your doctor or health care professional for regular checks on your progress. To help reduce irritation at the injection site, use a different site for each injection and make sure the solution is at room temperature before use. This medicine may cause decreases in blood sugar. Signs of low blood sugar include chills, cool, pale skin or cold sweats, drowsiness, extreme hunger, fast heartbeat, headache, nausea, nervousness or anxiety, shakiness, trembling, unsteadiness, tiredness, or weakness. Contact your doctor or health care professional right away if you experience any of these symptoms. This medicine may increase blood sugar. Ask your healthcare provider if changes in diet or medicines are needed if you have diabetes. This medicine may cause a decrease in vitamin B12. You should make sure that you get enough vitamin B12 while you are taking this medicine. Discuss the foods you eat and the vitamins you take with your health care professional. What side effects may I notice from receiving this medicine? Side   effects that you should report to your doctor or health care professional as soon as possible:  allergic reactions like skin rash, itching or hives, swelling of the face, lips, or tongue  fast, slow, or irregular heartbeat  right upper belly pain  severe stomach pain  signs  and symptoms of high blood sugar such as being more thirsty or hungry or having to urinate more than normal. You may also feel very tired or have blurry vision.  signs and symptoms of low blood sugar such as feeling anxious; confusion; dizziness; increased hunger; unusually weak or tired; increased sweating; shakiness; cold, clammy skin; irritable; headache; blurred vision; fast heartbeat; loss of consciousness  unusually weak or tired Side effects that usually do not require medical attention (report to your doctor or health care professional if they continue or are bothersome):  diarrhea  dizziness  gas  headache  nausea, vomiting  pain, redness, or irritation at site where injected  upset stomach This list may not describe all possible side effects. Call your doctor for medical advice about side effects. You may report side effects to FDA at 1-800-FDA-1088. Where should I keep my medicine? Keep out of the reach of children. Store in a refrigerator between 2 and 8 degrees C (36 and 46 degrees F). Protect from light. Allow to come to room temperature naturally. Do not use artificial heat. If protected from light, the injection may be stored at room temperature between 20 and 30 degrees C (70 and 86 degrees F) for 14 days. After the initial use, throw away any unused portion of a multiple dose vial after 14 days. Throw away unused portions of the ampules after use. NOTE: This sheet is a summary. It may not cover all possible information. If you have questions about this medicine, talk to your doctor, pharmacist, or health care provider.  2020 Elsevier/Gold Standard (2018-07-23 13:33:09)  

## 2019-08-26 ENCOUNTER — Other Ambulatory Visit: Payer: Self-pay | Admitting: Oncology

## 2019-08-26 DIAGNOSIS — C7A019 Malignant carcinoid tumor of the small intestine, unspecified portion: Secondary | ICD-10-CM

## 2019-09-03 ENCOUNTER — Encounter: Payer: Self-pay | Admitting: Oncology

## 2019-09-06 IMAGING — PT NM PET NOPR SKULL BASE TO THIGH
1 of 11 series · 1 of 25 positions shown · non-contrast
Comparison: MRI abdomen [DATE]

CLINICAL DATA: Metastatic neuroendocrine tumor with liver
metastasis.

EXAM:
NUCLEAR MEDICINE PET SKULL BASE TO THIGH
TECHNIQUE: 3.2 mCi Ga 68 DOTATATE was injected intravenously. Full-ring PET
imaging was performed from the skull base to thigh after the
radiotracer. CT data was obtained and used for attenuation
correction and anatomic localization.

[Series 8: ct sk_thigh 5.0 b31f · axial · 5.0mm · 0.98mm/px · 1 of 225 slices shown]
[im 225/225  brain]
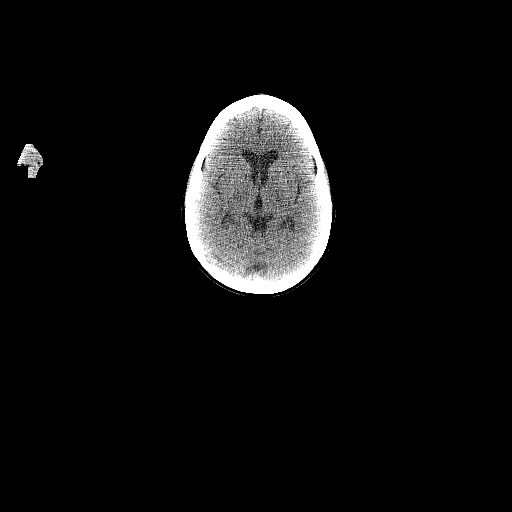

[1 of 25 positions shown; findings below may reference images not displayed]

FINDINGS: NECK

No radiotracer activity in neck lymph nodes.

CHEST

No radiotracer accumulation within mediastinal or hilar lymph nodes.
No suspicious pulmonary nodules on the CT scan.

ABDOMEN/PELVIS

Multiple low-density lesions in the LEFT and RIGHT hepatic lobe
corresponds abnormal findings on abdominal MR. Majority lesions are
intensely radiotracer avid. Intense lesion in the LEFT hepatic lobe
measuring 18 mm (image 102, series 8) with SUV max equal00.5. Lesion
in the posterior RIGHT hepatic lobe with SUV max equal 11.0. Two
smaller lesion in the anterior RIGHT hepatic lobe with SUV max equal
12.0.

Lobular mass within the lower abdomen inferior to the aortic
bifurcation and anterior to the upper sacrum is 3.5 by 2.6 cm with
intense radiotracer activity (SUV max equal 44.8). Several
additional mesenteric nodules adjacent this dominant nodule
measuring approximate 10 mm each (image 151, series 8 also intense
radiotracer activity. There is several dilated loops of small bowel
through this region.

In the same vicinity (mid small bowel upper pelvis) there is a short
intussusception (image 161, series 8). Just superior to this there
is mild dilatation of small bowel (image 16, series 8). There is
intense radiotracer activity within this density however this
registered. Favor this as site of small bowel primary. (Image 161,
series 8

Within deep RIGHT pelvis posterior to the RIGHT uterine body 12 mm
nodule (image 171, series 8 has intense radiotracer activity
consistent metastatic deposit

Physiologic activity noted in the liver, spleen, adrenal glands and
kidneys.

SKELETON

No focal activity to suggest skeletal metastasis.
IMPRESSION: 1. Multiple foci of intense radiotracer accumulation within hepatic
lesions consistent with well differentiated neuroendocrine tumor
metastasis.
2. Enlarged nodes / implants within central mesenteries of the upper
pelvis consistent nodal metastasis measuring up to 3.5 cm.
3. Short intussusceptions in the same vicinity(mid small bowel,
upper pelvis) with adjacent radiotracer activity (misregistration)
is favored site of primary small bowel neoplasm.
4. Additional small lymph nodes within the mesenteries with
associated metabolic activity.
5. Deep RIGHT pelvis nodule with radiotracer activity consistent
with pelvic metastasis.

## 2019-09-14 ENCOUNTER — Other Ambulatory Visit: Payer: Self-pay

## 2019-09-14 ENCOUNTER — Inpatient Hospital Stay: Payer: BC Managed Care – PPO | Attending: Oncology

## 2019-09-14 VITALS — BP 108/72 | HR 56 | Resp 17

## 2019-09-14 DIAGNOSIS — Z79899 Other long term (current) drug therapy: Secondary | ICD-10-CM | POA: Diagnosis not present

## 2019-09-14 DIAGNOSIS — C7A019 Malignant carcinoid tumor of the small intestine, unspecified portion: Secondary | ICD-10-CM

## 2019-09-14 MED ORDER — OCTREOTIDE ACETATE 30 MG IM KIT
30.0000 mg | PACK | Freq: Once | INTRAMUSCULAR | Status: AC
  Administered 2019-09-14: 30 mg via INTRAMUSCULAR

## 2019-09-14 MED ORDER — OCTREOTIDE ACETATE 30 MG IM KIT
PACK | INTRAMUSCULAR | Status: AC
  Filled 2019-09-14: qty 1

## 2019-09-30 ENCOUNTER — Encounter: Payer: Self-pay | Admitting: Oncology

## 2019-10-12 ENCOUNTER — Ambulatory Visit: Payer: BC Managed Care – PPO

## 2019-10-14 ENCOUNTER — Other Ambulatory Visit: Payer: Self-pay

## 2019-10-14 ENCOUNTER — Inpatient Hospital Stay: Payer: BC Managed Care – PPO | Attending: Oncology

## 2019-10-14 VITALS — BP 99/75

## 2019-10-14 DIAGNOSIS — C7A019 Malignant carcinoid tumor of the small intestine, unspecified portion: Secondary | ICD-10-CM | POA: Diagnosis present

## 2019-10-14 DIAGNOSIS — C7B02 Secondary carcinoid tumors of liver: Secondary | ICD-10-CM | POA: Insufficient documentation

## 2019-10-14 MED ORDER — OCTREOTIDE ACETATE 30 MG IM KIT
PACK | INTRAMUSCULAR | Status: AC
  Filled 2019-10-14: qty 1

## 2019-10-14 MED ORDER — OCTREOTIDE ACETATE 30 MG IM KIT
30.0000 mg | PACK | Freq: Once | INTRAMUSCULAR | Status: AC
  Administered 2019-10-14: 30 mg via INTRAMUSCULAR

## 2019-11-09 ENCOUNTER — Ambulatory Visit: Payer: BC Managed Care – PPO

## 2019-11-10 ENCOUNTER — Other Ambulatory Visit: Payer: Self-pay

## 2019-11-10 ENCOUNTER — Inpatient Hospital Stay: Payer: BC Managed Care – PPO | Attending: Oncology

## 2019-11-10 ENCOUNTER — Inpatient Hospital Stay: Payer: BC Managed Care – PPO

## 2019-11-10 VITALS — BP 120/89 | HR 58 | Temp 98.0°F | Resp 18

## 2019-11-10 DIAGNOSIS — Z79899 Other long term (current) drug therapy: Secondary | ICD-10-CM | POA: Diagnosis not present

## 2019-11-10 DIAGNOSIS — C7A019 Malignant carcinoid tumor of the small intestine, unspecified portion: Secondary | ICD-10-CM | POA: Insufficient documentation

## 2019-11-10 DIAGNOSIS — C7B02 Secondary carcinoid tumors of liver: Secondary | ICD-10-CM | POA: Diagnosis present

## 2019-11-10 MED ORDER — OCTREOTIDE ACETATE 30 MG IM KIT
30.0000 mg | PACK | Freq: Once | INTRAMUSCULAR | Status: AC
  Administered 2019-11-10: 30 mg via INTRAMUSCULAR

## 2019-11-10 MED ORDER — OCTREOTIDE ACETATE 30 MG IM KIT
PACK | INTRAMUSCULAR | Status: AC
  Filled 2019-11-10: qty 1

## 2019-11-10 NOTE — Patient Instructions (Signed)
Octreotide injection solution What is this medicine? OCTREOTIDE (ok TREE oh tide) is used to reduce blood levels of growth hormone in patients with a condition called acromegaly. This medicine also reduces flushing and watery diarrhea caused by certain types of cancer. This medicine may be used for other purposes; ask your health care provider or pharmacist if you have questions. COMMON BRAND NAME(S): Bynfezia, Sandostatin What should I tell my health care provider before I take this medicine? They need to know if you have any of these conditions:  diabetes  gallbladder disease  kidney disease  liver disease  thyroid disease  an unusual or allergic reaction to octreotide, other medicines, foods, dyes, or preservatives  pregnant or trying to get pregnant  breast-feeding How should I use this medicine? This medicine is for injection under the skin or into a vein (only in emergency situations). It is usually given by a health care professional in a hospital or clinic setting. If you get this medicine at home, you will be taught how to prepare and give this medicine. Allow the injection solution to come to room temperature before use. Do not warm it artificially. Use exactly as directed. Take your medicine at regular intervals. Do not take your medicine more often than directed. It is important that you put your used needles and syringes in a special sharps container. Do not put them in a trash can. If you do not have a sharps container, call your pharmacist or healthcare provider to get one. Talk to your pediatrician regarding the use of this medicine in children. Special care may be needed. Overdosage: If you think you have taken too much of this medicine contact a poison control center or emergency room at once. NOTE: This medicine is only for you. Do not share this medicine with others. What if I miss a dose? If you miss a dose, take it as soon as you can. If it is almost time for your  next dose, take only that dose. Do not take double or extra doses. What may interact with this medicine?  bromocriptine  certain medicines for blood pressure, heart disease, irregular heartbeat  cyclosporine  diuretics  medicines for diabetes, including insulin  quinidine This list may not describe all possible interactions. Give your health care provider a list of all the medicines, herbs, non-prescription drugs, or dietary supplements you use. Also tell them if you smoke, drink alcohol, or use illegal drugs. Some items may interact with your medicine. What should I watch for while using this medicine? Visit your doctor or health care professional for regular checks on your progress. To help reduce irritation at the injection site, use a different site for each injection and make sure the solution is at room temperature before use. This medicine may cause decreases in blood sugar. Signs of low blood sugar include chills, cool, pale skin or cold sweats, drowsiness, extreme hunger, fast heartbeat, headache, nausea, nervousness or anxiety, shakiness, trembling, unsteadiness, tiredness, or weakness. Contact your doctor or health care professional right away if you experience any of these symptoms. This medicine may increase blood sugar. Ask your healthcare provider if changes in diet or medicines are needed if you have diabetes. This medicine may cause a decrease in vitamin B12. You should make sure that you get enough vitamin B12 while you are taking this medicine. Discuss the foods you eat and the vitamins you take with your health care professional. What side effects may I notice from receiving this medicine? Side   effects that you should report to your doctor or health care professional as soon as possible:  allergic reactions like skin rash, itching or hives, swelling of the face, lips, or tongue  fast, slow, or irregular heartbeat  right upper belly pain  severe stomach pain  signs  and symptoms of high blood sugar such as being more thirsty or hungry or having to urinate more than normal. You may also feel very tired or have blurry vision.  signs and symptoms of low blood sugar such as feeling anxious; confusion; dizziness; increased hunger; unusually weak or tired; increased sweating; shakiness; cold, clammy skin; irritable; headache; blurred vision; fast heartbeat; loss of consciousness  unusually weak or tired Side effects that usually do not require medical attention (report to your doctor or health care professional if they continue or are bothersome):  diarrhea  dizziness  gas  headache  nausea, vomiting  pain, redness, or irritation at site where injected  upset stomach This list may not describe all possible side effects. Call your doctor for medical advice about side effects. You may report side effects to FDA at 1-800-FDA-1088. Where should I keep my medicine? Keep out of the reach of children. Store in a refrigerator between 2 and 8 degrees C (36 and 46 degrees F). Protect from light. Allow to come to room temperature naturally. Do not use artificial heat. If protected from light, the injection may be stored at room temperature between 20 and 30 degrees C (70 and 86 degrees F) for 14 days. After the initial use, throw away any unused portion of a multiple dose vial after 14 days. Throw away unused portions of the ampules after use. NOTE: This sheet is a summary. It may not cover all possible information. If you have questions about this medicine, talk to your doctor, pharmacist, or health care provider.  2020 Elsevier/Gold Standard (2018-07-23 13:33:09)  

## 2019-11-11 ENCOUNTER — Ambulatory Visit: Payer: BC Managed Care – PPO

## 2019-12-06 ENCOUNTER — Other Ambulatory Visit: Payer: Self-pay | Admitting: *Deleted

## 2019-12-06 DIAGNOSIS — C7A019 Malignant carcinoid tumor of the small intestine, unspecified portion: Secondary | ICD-10-CM

## 2019-12-07 ENCOUNTER — Other Ambulatory Visit: Payer: Self-pay | Admitting: *Deleted

## 2019-12-07 ENCOUNTER — Other Ambulatory Visit: Payer: Self-pay

## 2019-12-07 ENCOUNTER — Inpatient Hospital Stay: Payer: BC Managed Care – PPO

## 2019-12-07 ENCOUNTER — Inpatient Hospital Stay (HOSPITAL_BASED_OUTPATIENT_CLINIC_OR_DEPARTMENT_OTHER): Payer: BC Managed Care – PPO | Admitting: Oncology

## 2019-12-07 ENCOUNTER — Ambulatory Visit (HOSPITAL_COMMUNITY)
Admission: RE | Admit: 2019-12-07 | Discharge: 2019-12-07 | Disposition: A | Discharge status: Home / Self Care | Payer: BC Managed Care – PPO | Source: Ambulatory Visit | Attending: Oncology | Admitting: Oncology

## 2019-12-07 VITALS — BP 109/70 | HR 63 | Temp 97.1°F | Resp 17 | Ht 65.0 in | Wt 131.5 lb

## 2019-12-07 DIAGNOSIS — Z23 Encounter for immunization: Secondary | ICD-10-CM

## 2019-12-07 DIAGNOSIS — C7A019 Malignant carcinoid tumor of the small intestine, unspecified portion: Secondary | ICD-10-CM

## 2019-12-07 MED ORDER — GALLIUM GA 68 DOTATATE IV KIT
3.1000 | PACK | Freq: Once | INTRAVENOUS | Status: AC | PRN
  Administered 2019-12-07: 3.1 via INTRAVENOUS

## 2019-12-07 MED ORDER — OCTREOTIDE ACETATE 30 MG IM KIT
PACK | INTRAMUSCULAR | Status: AC
  Filled 2019-12-07: qty 1

## 2019-12-07 MED ORDER — OCTREOTIDE ACETATE 30 MG IM KIT
30.0000 mg | PACK | Freq: Once | INTRAMUSCULAR | Status: AC
  Administered 2019-12-07: 30 mg via INTRAMUSCULAR

## 2019-12-07 NOTE — Progress Notes (Signed)
[place] OFFICE PROGRESS NOTE   Diagnosis: Carcinoid tumor  INTERVAL HISTORY:   Brittney [name] returns as scheduled.  She feels well.  No consistent abdominal pain.  She has regular bowel habits while taking Metamucil.  She continues monthly Sandostatin.  She ran a marathon earlier this month.  Objective:  Vital signs in last 24 hours:  Blood pressure 109/70, pulse 63, temperature (!) 97.1 F (36.2 C), temperature source Tympanic, resp. rate 17, height 5\' 5"  (1.651 m), weight 131 lb 8 oz (59.6 kg), last menstrual period 01/31/2014, SpO2 100 %.    Resp: Lungs clear bilaterally Cardio: Regular rate and rhythm GI: No hepatosplenomegaly, no mass, nontender Vascular: No leg edema   Lab Results:  Lab Results  Component Value Date   WBC 3.5 (L) 06/23/2018   HGB 13.6 06/23/2018   HCT 41.1 06/23/2018   MCV 95.4 06/23/2018   PLT 168 06/23/2018   NEUTROABS 1.8 06/23/2018    CMP  Lab Results  Component Value Date   NA 142 01/06/2019   K 4.1 01/06/2019   CL 102 01/06/2019   CO2 29 01/06/2019   GLUCOSE 103 (H) 01/06/2019   BUN 16 01/06/2019   CREATININE 0.79 01/06/2019   CALCIUM 9.1 01/06/2019   PROT 7.4 01/06/2019   ALBUMIN 4.3 01/06/2019   AST 18 01/06/2019   ALT 10 01/06/2019   ALKPHOS 64 01/06/2019   BILITOT 0.6 01/06/2019   GFRNONAA >60 01/06/2019   GFRAA >60 01/06/2019     Medications: I have reviewed the patient's current medications.   Assessment/Plan: 1. Metastatic neuroendocrine tumor ? MRI of the abdomen 10/02/2016 confirmed multiple enhancing liver lesions consistent with metastases, no primary tumor site identified ? Ultrasound-guided biopsy of a left liver lesion 10/07/2016-neuroendocrine neoplasm, "intermediate grade ", review of pathology at GI tumor conference consistent with a low-grade carcinoid tumor ? Elevated chromogranin A 10/15/2016 ? gallium-DOTATATEscan 10/23/2016-multiple foci of liver metastases, enlarged central  mesenteric lymph nodes, short intussusception's in adjacent small bowel felt to represent a primary small bowel tumor, additional mesenteric lymph nodes with metabolic activity, deep right pelvic nodule consistent with a pelvic metastasis ? Small bowel resection 11/21/2016-mid ileum mass, low-grade neuroendocrine tumor,pT4,pN2, 6/18 lymph nodes positive, positive mesenteric resection margin (lymph node), intramural satellite nodule ? CT 11/30/2016-small bowel obstruction with transition point adjacent to suture line at the distal small bowel, liver metastases similar to the 10/02/2016 MRI ? Recurrent obstructive symptoms requiring repeat surgery with resection of the small bowel anastomosis site 12/13/2016, pathology negative for malignancy ? Initiation of monthly Sandostatin 12/20/2016 ? CT abdomen/pelvis 02/19/2017-mild enlargement of liver lesions compared to November 2018 ? Cycle 1 Lutathera 06/17/2017 ? Cycle 2 Lutathera 08/13/2017 ? Cycle 3 Lutathera 10/07/2017 ? Cycle 4 Lutathera 12/02/2017 ? Dotatate scan 12/29/2017-no evidence of new or metastatic disease or progression.  Multiple hepatic metastasis.  Lesions have decreased in size from the most recent CT scan and slightly increased in radiotracer activity from dotatate PET scan 10/23/2016.  Interval resection of small bowel tumor and bulky mesenteric metastasis.  No residual activity in the bowel or mesentery.  Single small focus of activity in the deep right pelvis along the peritoneal surface not changed from comparison exam. ? Monthly Sandostatin continued ? Netspot 06/23/2018- mild decrease in radiotracer activity of all hepatic metastases, no new lesions, slight enlargement of several liver lesions ? Monthly Sandostatin continued ? Netspot 03/31/2019-no new liver lesions, no change in the size of liver lesions with a mild decrease in tracer  activity involving several lesions, stable right pelvic peritoneal implant ? Monthly Sandostatin  continued  2. 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 Intermittent abdominal bloating and left-sided abdominal pain- resolved  4.Admission 11/30/2016 with a small bowel obstruction    Disposition: Brittney [name] appears unchanged.  I reviewed today's Netspot images with Brittney [name] and her husband.  I see no evidence of disease progression.  We will follow up on the final report and the chromogranin A level from today.  The plan is to continue monthly Sandostatin.  She will return for an office and lab visit in 4 months.  [name], MD  12/07/2019  11:17 AM

## 2019-12-07 NOTE — Patient Instructions (Signed)
Octreotide injection solution What is this medicine? OCTREOTIDE (ok TREE oh tide) is used to reduce blood levels of growth hormone in patients with a condition called acromegaly. This medicine also reduces flushing and watery diarrhea caused by certain types of cancer. This medicine may be used for other purposes; ask your health care provider or pharmacist if you have questions. COMMON BRAND NAME(S): Bynfezia, Sandostatin What should I tell my health care provider before I take this medicine? They need to know if you have any of these conditions:  diabetes  gallbladder disease  kidney disease  liver disease  thyroid disease  an unusual or allergic reaction to octreotide, other medicines, foods, dyes, or preservatives  pregnant or trying to get pregnant  breast-feeding How should I use this medicine? This medicine is for injection under the skin or into a vein (only in emergency situations). It is usually given by a health care professional in a hospital or clinic setting. If you get this medicine at home, you will be taught how to prepare and give this medicine. Allow the injection solution to come to room temperature before use. Do not warm it artificially. Use exactly as directed. Take your medicine at regular intervals. Do not take your medicine more often than directed. It is important that you put your used needles and syringes in a special sharps container. Do not put them in a trash can. If you do not have a sharps container, call your pharmacist or healthcare provider to get one. Talk to your pediatrician regarding the use of this medicine in children. Special care may be needed. Overdosage: If you think you have taken too much of this medicine contact a poison control center or emergency room at once. NOTE: This medicine is only for you. Do not share this medicine with others. What if I miss a dose? If you miss a dose, take it as soon as you can. If it is almost time for your  next dose, take only that dose. Do not take double or extra doses. What may interact with this medicine?  bromocriptine  certain medicines for blood pressure, heart disease, irregular heartbeat  cyclosporine  diuretics  medicines for diabetes, including insulin  quinidine This list may not describe all possible interactions. Give your health care provider a list of all the medicines, herbs, non-prescription drugs, or dietary supplements you use. Also tell them if you smoke, drink alcohol, or use illegal drugs. Some items may interact with your medicine. What should I watch for while using this medicine? Visit your doctor or health care professional for regular checks on your progress. To help reduce irritation at the injection site, use a different site for each injection and make sure the solution is at room temperature before use. This medicine may cause decreases in blood sugar. Signs of low blood sugar include chills, cool, pale skin or cold sweats, drowsiness, extreme hunger, fast heartbeat, headache, nausea, nervousness or anxiety, shakiness, trembling, unsteadiness, tiredness, or weakness. Contact your doctor or health care professional right away if you experience any of these symptoms. This medicine may increase blood sugar. Ask your healthcare provider if changes in diet or medicines are needed if you have diabetes. This medicine may cause a decrease in vitamin B12. You should make sure that you get enough vitamin B12 while you are taking this medicine. Discuss the foods you eat and the vitamins you take with your health care professional. What side effects may I notice from receiving this medicine? Side   effects that you should report to your doctor or health care professional as soon as possible:  allergic reactions like skin rash, itching or hives, swelling of the face, lips, or tongue  fast, slow, or irregular heartbeat  right upper belly pain  severe stomach pain  signs  and symptoms of high blood sugar such as being more thirsty or hungry or having to urinate more than normal. You may also feel very tired or have blurry vision.  signs and symptoms of low blood sugar such as feeling anxious; confusion; dizziness; increased hunger; unusually weak or tired; increased sweating; shakiness; cold, clammy skin; irritable; headache; blurred vision; fast heartbeat; loss of consciousness  unusually weak or tired Side effects that usually do not require medical attention (report to your doctor or health care professional if they continue or are bothersome):  diarrhea  dizziness  gas  headache  nausea, vomiting  pain, redness, or irritation at site where injected  upset stomach This list may not describe all possible side effects. Call your doctor for medical advice about side effects. You may report side effects to FDA at 1-800-FDA-1088. Where should I keep my medicine? Keep out of the reach of children. Store in a refrigerator between 2 and 8 degrees C (36 and 46 degrees F). Protect from light. Allow to come to room temperature naturally. Do not use artificial heat. If protected from light, the injection may be stored at room temperature between 20 and 30 degrees C (70 and 86 degrees F) for 14 days. After the initial use, throw away any unused portion of a multiple dose vial after 14 days. Throw away unused portions of the ampules after use. NOTE: This sheet is a summary. It may not cover all possible information. If you have questions about this medicine, talk to your doctor, pharmacist, or health care provider.  2020 Elsevier/Gold Standard (2018-07-23 13:33:09)  

## 2019-12-08 ENCOUNTER — Telehealth: Payer: Self-pay | Admitting: Diagnostic Radiology

## 2019-12-08 ENCOUNTER — Telehealth: Payer: Self-pay | Admitting: Oncology

## 2019-12-08 LAB — CHROMOGRANIN A: Chromogranin A (ng/mL): 65.4 ng/mL (ref 0.0–101.8)

## 2019-12-08 NOTE — Telephone Encounter (Signed)
Scheduled appointments per 11/30 los. Called patient, no answer. Left message for patient with appointments dates and times.

## 2019-12-08 NOTE — Telephone Encounter (Signed)
Reviewed DOTATATE PET scan results with [name] and her husband [name].

## 2019-12-23 ENCOUNTER — Encounter: Payer: Self-pay | Admitting: Nurse Practitioner

## 2020-01-03 ENCOUNTER — Encounter: Payer: Self-pay | Admitting: Nurse Practitioner

## 2020-01-03 ENCOUNTER — Other Ambulatory Visit: Payer: Self-pay

## 2020-01-04 ENCOUNTER — Inpatient Hospital Stay: Payer: BC Managed Care – PPO

## 2020-01-04 ENCOUNTER — Other Ambulatory Visit: Payer: Self-pay

## 2020-01-04 ENCOUNTER — Inpatient Hospital Stay: Payer: BC Managed Care – PPO | Attending: Oncology

## 2020-01-04 VITALS — BP 110/75 | HR 55

## 2020-01-04 DIAGNOSIS — C7B02 Secondary carcinoid tumors of liver: Secondary | ICD-10-CM | POA: Diagnosis present

## 2020-01-04 DIAGNOSIS — C7A019 Malignant carcinoid tumor of the small intestine, unspecified portion: Secondary | ICD-10-CM

## 2020-01-04 DIAGNOSIS — Z79899 Other long term (current) drug therapy: Secondary | ICD-10-CM | POA: Diagnosis not present

## 2020-01-04 MED ORDER — OCTREOTIDE ACETATE 30 MG IM KIT
PACK | INTRAMUSCULAR | Status: AC
  Filled 2020-01-04: qty 1

## 2020-01-04 MED ORDER — OCTREOTIDE ACETATE 30 MG IM KIT
30.0000 mg | PACK | Freq: Once | INTRAMUSCULAR | Status: AC
  Administered 2020-01-04: 11:00:00 30 mg via INTRAMUSCULAR

## 2020-01-04 NOTE — Patient Instructions (Signed)
Octreotide injection solution What is this medicine? OCTREOTIDE (ok TREE oh tide) is used to reduce blood levels of growth hormone in patients with a condition called acromegaly. This medicine also reduces flushing and watery diarrhea caused by certain types of cancer. This medicine may be used for other purposes; ask your health care provider or pharmacist if you have questions. COMMON BRAND NAME(S): Bynfezia, Sandostatin What should I tell my health care provider before I take this medicine? They need to know if you have any of these conditions:  diabetes  gallbladder disease  kidney disease  liver disease  thyroid disease  an unusual or allergic reaction to octreotide, other medicines, foods, dyes, or preservatives  pregnant or trying to get pregnant  breast-feeding How should I use this medicine? This medicine is for injection under the skin or into a vein (only in emergency situations). It is usually given by a health care professional in a hospital or clinic setting. If you get this medicine at home, you will be taught how to prepare and give this medicine. Allow the injection solution to come to room temperature before use. Do not warm it artificially. Use exactly as directed. Take your medicine at regular intervals. Do not take your medicine more often than directed. It is important that you put your used needles and syringes in a special sharps container. Do not put them in a trash can. If you do not have a sharps container, call your pharmacist or healthcare provider to get one. Talk to your pediatrician regarding the use of this medicine in children. Special care may be needed. Overdosage: If you think you have taken too much of this medicine contact a poison control center or emergency room at once. NOTE: This medicine is only for you. Do not share this medicine with others. What if I miss a dose? If you miss a dose, take it as soon as you can. If it is almost time for your  next dose, take only that dose. Do not take double or extra doses. What may interact with this medicine?  bromocriptine  certain medicines for blood pressure, heart disease, irregular heartbeat  cyclosporine  diuretics  medicines for diabetes, including insulin  quinidine This list may not describe all possible interactions. Give your health care provider a list of all the medicines, herbs, non-prescription drugs, or dietary supplements you use. Also tell them if you smoke, drink alcohol, or use illegal drugs. Some items may interact with your medicine. What should I watch for while using this medicine? Visit your doctor or health care professional for regular checks on your progress. To help reduce irritation at the injection site, use a different site for each injection and make sure the solution is at room temperature before use. This medicine may cause decreases in blood sugar. Signs of low blood sugar include chills, cool, pale skin or cold sweats, drowsiness, extreme hunger, fast heartbeat, headache, nausea, nervousness or anxiety, shakiness, trembling, unsteadiness, tiredness, or weakness. Contact your doctor or health care professional right away if you experience any of these symptoms. This medicine may increase blood sugar. Ask your healthcare provider if changes in diet or medicines are needed if you have diabetes. This medicine may cause a decrease in vitamin B12. You should make sure that you get enough vitamin B12 while you are taking this medicine. Discuss the foods you eat and the vitamins you take with your health care professional. What side effects may I notice from receiving this medicine? Side   effects that you should report to your doctor or health care professional as soon as possible:  allergic reactions like skin rash, itching or hives, swelling of the face, lips, or tongue  fast, slow, or irregular heartbeat  right upper belly pain  severe stomach pain  signs  and symptoms of high blood sugar such as being more thirsty or hungry or having to urinate more than normal. You may also feel very tired or have blurry vision.  signs and symptoms of low blood sugar such as feeling anxious; confusion; dizziness; increased hunger; unusually weak or tired; increased sweating; shakiness; cold, clammy skin; irritable; headache; blurred vision; fast heartbeat; loss of consciousness  unusually weak or tired Side effects that usually do not require medical attention (report to your doctor or health care professional if they continue or are bothersome):  diarrhea  dizziness  gas  headache  nausea, vomiting  pain, redness, or irritation at site where injected  upset stomach This list may not describe all possible side effects. Call your doctor for medical advice about side effects. You may report side effects to FDA at 1-800-FDA-1088. Where should I keep my medicine? Keep out of the reach of children. Store in a refrigerator between 2 and 8 degrees C (36 and 46 degrees F). Protect from light. Allow to come to room temperature naturally. Do not use artificial heat. If protected from light, the injection may be stored at room temperature between 20 and 30 degrees C (70 and 86 degrees F) for 14 days. After the initial use, throw away any unused portion of a multiple dose vial after 14 days. Throw away unused portions of the ampules after use. NOTE: This sheet is a summary. It may not cover all possible information. If you have questions about this medicine, talk to your doctor, pharmacist, or health care provider.  2020 Elsevier/Gold Standard (2018-07-23 13:33:09)  

## 2020-01-20 ENCOUNTER — Encounter: Payer: Self-pay | Admitting: Oncology

## 2020-01-21 ENCOUNTER — Telehealth: Payer: Self-pay | Admitting: Nurse Practitioner

## 2020-01-21 NOTE — Telephone Encounter (Signed)
Rescheduled appointment per 1/13 schedule message. Patient is aware of changes. 

## 2020-01-25 ENCOUNTER — Encounter: Payer: Self-pay | Admitting: Nurse Practitioner

## 2020-01-26 ENCOUNTER — Telehealth: Payer: Self-pay | Admitting: Oncology

## 2020-01-26 NOTE — Telephone Encounter (Signed)
R/s injection appt per 1/19 sch msg. Pt confirmed appt change.

## 2020-02-02 ENCOUNTER — Inpatient Hospital Stay: Payer: BC Managed Care – PPO | Attending: Oncology

## 2020-02-02 ENCOUNTER — Other Ambulatory Visit: Payer: Self-pay

## 2020-02-02 ENCOUNTER — Other Ambulatory Visit: Payer: BC Managed Care – PPO

## 2020-02-02 VITALS — BP 113/78

## 2020-02-02 DIAGNOSIS — C7B02 Secondary carcinoid tumors of liver: Secondary | ICD-10-CM | POA: Insufficient documentation

## 2020-02-02 DIAGNOSIS — Z79899 Other long term (current) drug therapy: Secondary | ICD-10-CM | POA: Diagnosis not present

## 2020-02-02 DIAGNOSIS — C7A019 Malignant carcinoid tumor of the small intestine, unspecified portion: Secondary | ICD-10-CM | POA: Diagnosis not present

## 2020-02-02 MED ORDER — OCTREOTIDE ACETATE 30 MG IM KIT
PACK | INTRAMUSCULAR | Status: AC
  Filled 2020-02-02: qty 1

## 2020-02-02 MED ORDER — OCTREOTIDE ACETATE 30 MG IM KIT
30.0000 mg | PACK | Freq: Once | INTRAMUSCULAR | Status: AC
  Administered 2020-02-02: 30 mg via INTRAMUSCULAR

## 2020-02-02 NOTE — Patient Instructions (Signed)
Octreotide injection solution What is this medicine? OCTREOTIDE (ok TREE oh tide) is used to reduce blood levels of growth hormone in patients with a condition called acromegaly. This medicine also reduces flushing and watery diarrhea caused by certain types of cancer. This medicine may be used for other purposes; ask your health care provider or pharmacist if you have questions. COMMON BRAND NAME(S): Bynfezia, Sandostatin What should I tell my health care provider before I take this medicine? They need to know if you have any of these conditions:  diabetes  gallbladder disease  kidney disease  liver disease  thyroid disease  an unusual or allergic reaction to octreotide, other medicines, foods, dyes, or preservatives  pregnant or trying to get pregnant  breast-feeding How should I use this medicine? This medicine is for injection under the skin or into a vein (only in emergency situations). It is usually given by a health care professional in a hospital or clinic setting. If you get this medicine at home, you will be taught how to prepare and give this medicine. Allow the injection solution to come to room temperature before use. Do not warm it artificially. Use exactly as directed. Take your medicine at regular intervals. Do not take your medicine more often than directed. It is important that you put your used needles and syringes in a special sharps container. Do not put them in a trash can. If you do not have a sharps container, call your pharmacist or healthcare provider to get one. Talk to your pediatrician regarding the use of this medicine in children. Special care may be needed. Overdosage: If you think you have taken too much of this medicine contact a poison control center or emergency room at once. NOTE: This medicine is only for you. Do not share this medicine with others. What if I miss a dose? If you miss a dose, take it as soon as you can. If it is almost time for your  next dose, take only that dose. Do not take double or extra doses. What may interact with this medicine?  bromocriptine  certain medicines for blood pressure, heart disease, irregular heartbeat  cyclosporine  diuretics  medicines for diabetes, including insulin  quinidine This list may not describe all possible interactions. Give your health care provider a list of all the medicines, herbs, non-prescription drugs, or dietary supplements you use. Also tell them if you smoke, drink alcohol, or use illegal drugs. Some items may interact with your medicine. What should I watch for while using this medicine? Visit your doctor or health care professional for regular checks on your progress. To help reduce irritation at the injection site, use a different site for each injection and make sure the solution is at room temperature before use. This medicine may cause decreases in blood sugar. Signs of low blood sugar include chills, cool, pale skin or cold sweats, drowsiness, extreme hunger, fast heartbeat, headache, nausea, nervousness or anxiety, shakiness, trembling, unsteadiness, tiredness, or weakness. Contact your doctor or health care professional right away if you experience any of these symptoms. This medicine may increase blood sugar. Ask your healthcare provider if changes in diet or medicines are needed if you have diabetes. This medicine may cause a decrease in vitamin B12. You should make sure that you get enough vitamin B12 while you are taking this medicine. Discuss the foods you eat and the vitamins you take with your health care professional. What side effects may I notice from receiving this medicine? Side   effects that you should report to your doctor or health care professional as soon as possible:  allergic reactions like skin rash, itching or hives, swelling of the face, lips, or tongue  fast, slow, or irregular heartbeat  right upper belly pain  severe stomach pain  signs  and symptoms of high blood sugar such as being more thirsty or hungry or having to urinate more than normal. You may also feel very tired or have blurry vision.  signs and symptoms of low blood sugar such as feeling anxious; confusion; dizziness; increased hunger; unusually weak or tired; increased sweating; shakiness; cold, clammy skin; irritable; headache; blurred vision; fast heartbeat; loss of consciousness  unusually weak or tired Side effects that usually do not require medical attention (report to your doctor or health care professional if they continue or are bothersome):  diarrhea  dizziness  gas  headache  nausea, vomiting  pain, redness, or irritation at site where injected  upset stomach This list may not describe all possible side effects. Call your doctor for medical advice about side effects. You may report side effects to FDA at 1-800-FDA-1088. Where should I keep my medicine? Keep out of the reach of children. Store in a refrigerator between 2 and 8 degrees C (36 and 46 degrees F). Protect from light. Allow to come to room temperature naturally. Do not use artificial heat. If protected from light, the injection may be stored at room temperature between 20 and 30 degrees C (70 and 86 degrees F) for 14 days. After the initial use, throw away any unused portion of a multiple dose vial after 14 days. Throw away unused portions of the ampules after use. NOTE: This sheet is a summary. It may not cover all possible information. If you have questions about this medicine, talk to your doctor, pharmacist, or health care provider.  2021 [employer]vier/Gold Standard (2018-07-23 13:33:09)  

## 2020-02-23 ENCOUNTER — Telehealth: Payer: Self-pay | Admitting: Oncology

## 2020-02-23 NOTE — Telephone Encounter (Signed)
R.s appt per 2/15 sch msg - pt is aware of appt time change

## 2020-02-28 ENCOUNTER — Inpatient Hospital Stay: Payer: BC Managed Care – PPO | Attending: Oncology

## 2020-02-28 ENCOUNTER — Other Ambulatory Visit: Payer: Self-pay

## 2020-02-28 VITALS — BP 110/83 | HR 53 | Resp 16

## 2020-02-28 DIAGNOSIS — Z79899 Other long term (current) drug therapy: Secondary | ICD-10-CM | POA: Diagnosis not present

## 2020-02-28 DIAGNOSIS — C7A019 Malignant carcinoid tumor of the small intestine, unspecified portion: Secondary | ICD-10-CM | POA: Insufficient documentation

## 2020-02-28 DIAGNOSIS — C7B02 Secondary carcinoid tumors of liver: Secondary | ICD-10-CM | POA: Insufficient documentation

## 2020-02-28 MED ORDER — OCTREOTIDE ACETATE 30 MG IM KIT
PACK | INTRAMUSCULAR | Status: AC
  Filled 2020-02-28: qty 1

## 2020-02-28 MED ORDER — OCTREOTIDE ACETATE 30 MG IM KIT
30.0000 mg | PACK | Freq: Once | INTRAMUSCULAR | Status: AC
  Administered 2020-02-28: 30 mg via INTRAMUSCULAR

## 2020-03-01 ENCOUNTER — Ambulatory Visit: Payer: BC Managed Care – PPO

## 2020-03-01 ENCOUNTER — Other Ambulatory Visit: Payer: BC Managed Care – PPO

## 2020-03-27 ENCOUNTER — Other Ambulatory Visit: Payer: Self-pay

## 2020-03-27 ENCOUNTER — Ambulatory Visit: Payer: BC Managed Care – PPO | Admitting: Nurse Practitioner

## 2020-03-27 ENCOUNTER — Inpatient Hospital Stay: Payer: BC Managed Care – PPO | Attending: Oncology

## 2020-03-27 ENCOUNTER — Inpatient Hospital Stay (HOSPITAL_BASED_OUTPATIENT_CLINIC_OR_DEPARTMENT_OTHER): Payer: BC Managed Care – PPO | Admitting: Oncology

## 2020-03-27 ENCOUNTER — Ambulatory Visit: Payer: BC Managed Care – PPO

## 2020-03-27 ENCOUNTER — Inpatient Hospital Stay: Payer: BC Managed Care – PPO

## 2020-03-27 ENCOUNTER — Other Ambulatory Visit: Payer: BC Managed Care – PPO

## 2020-03-27 VITALS — BP 111/67 | HR 64 | Temp 97.8°F | Resp 18 | Ht 65.0 in | Wt 133.2 lb

## 2020-03-27 DIAGNOSIS — Z79899 Other long term (current) drug therapy: Secondary | ICD-10-CM | POA: Insufficient documentation

## 2020-03-27 DIAGNOSIS — C7B02 Secondary carcinoid tumors of liver: Secondary | ICD-10-CM | POA: Diagnosis present

## 2020-03-27 DIAGNOSIS — C7A019 Malignant carcinoid tumor of the small intestine, unspecified portion: Secondary | ICD-10-CM

## 2020-03-27 LAB — CMP (CANCER CENTER ONLY)
ALT: 13 U/L (ref 0–44)
AST: 21 U/L (ref 15–41)
Albumin: 4 g/dL (ref 3.5–5.0)
Alkaline Phosphatase: 63 U/L (ref 38–126)
Anion gap: 6 (ref 5–15)
BUN: 11 mg/dL (ref 6–20)
CO2: 28 mmol/L (ref 22–32)
Calcium: 9.1 mg/dL (ref 8.9–10.3)
Chloride: 105 mmol/L (ref 98–111)
Creatinine: 0.79 mg/dL (ref 0.44–1.00)
GFR, Estimated: >60 mL/min (ref >60)
Glucose, Bld: 78 mg/dL (ref 70–99)
Potassium: 4.3 mmol/L (ref 3.5–5.1)
Sodium: 139 mmol/L (ref 135–145)
Total Bilirubin: 0.6 mg/dL (ref 0.3–1.2)
Total Protein: 7.2 g/dL (ref 6.5–8.1)

## 2020-03-27 LAB — CBC WITH DIFFERENTIAL (CANCER CENTER ONLY)
Abs Immature Granulocytes: 0.02 10*3/uL (ref 0.00–0.07)
Basophils Absolute: 0 10*3/uL (ref 0.0–0.1)
Basophils Relative: 0 %
Eosinophils Absolute: 0.2 10*3/uL (ref 0.0–0.5)
Eosinophils Relative: 4 %
HCT: 42 % (ref 36.0–46.0)
Hemoglobin: 13.6 g/dL (ref 12.0–15.0)
Immature Granulocytes: 0 %
Lymphocytes Relative: 41 %
Lymphs Abs: 1.9 10*3/uL (ref 0.7–4.0)
MCH: 30.4 pg (ref 26.0–34.0)
MCHC: 32.4 g/dL (ref 30.0–36.0)
MCV: 94 fL (ref 80.0–100.0)
Monocytes Absolute: 0.3 10*3/uL (ref 0.1–1.0)
Monocytes Relative: 7 %
Neutro Abs: 2.1 10*3/uL (ref 1.7–7.7)
Neutrophils Relative %: 48 %
Platelet Count: 172 10*3/uL (ref 150–400)
RBC: 4.47 MIL/uL (ref 3.87–5.11)
RDW: 12.7 % (ref 11.5–15.5)
WBC Count: 4.5 10*3/uL (ref 4.0–10.5)
nRBC: 0 % (ref 0.0–0.2)

## 2020-03-27 MED ORDER — OCTREOTIDE ACETATE 30 MG IM KIT
PACK | INTRAMUSCULAR | Status: AC
  Filled 2020-03-27: qty 1

## 2020-03-27 MED ORDER — OCTREOTIDE ACETATE 30 MG IM KIT
30.0000 mg | PACK | Freq: Once | INTRAMUSCULAR | Status: AC
  Administered 2020-03-27: 30 mg via INTRAMUSCULAR

## 2020-03-27 NOTE — Progress Notes (Signed)
[place] OFFICE PROGRESS NOTE   Diagnosis: Carcinoid tumor  INTERVAL HISTORY:   Brittney [name] returns as scheduled.  She continues monthly Sandostatin.  No diarrhea or flushing.  She continues to have irregular bowel habits.  She has a "stitch "in the left abdomen.  She is exercising regularly.  She has a rash at the left lower abdomen.  No rash in other sites.  Objective:  Vital signs in last 24 hours:  Blood pressure 111/67, pulse 64, temperature 97.8 F (36.6 C), temperature source Tympanic, resp. rate 18, height 5\' 5"  (1.651 m), weight 133 lb 3.2 oz (60.4 kg), last menstrual period 01/31/2014, SpO2 99 %.    Resp: Lungs clear bilaterally Cardio: Regular rate and rhythm GI: No mass, nontender, no hepatosplenomegaly Vascular: No leg edema  Skin: Dry slightly raised plaque of erythema at the left low lateral abdomen, no vesicles    Lab Results:  Lab Results  Component Value Date   WBC 4.5 03/27/2020   HGB 13.6 03/27/2020   HCT 42.0 03/27/2020   MCV 94.0 03/27/2020   PLT 172 03/27/2020   NEUTROABS 2.1 03/27/2020    CMP  Lab Results  Component Value Date   NA 139 03/27/2020   K 4.3 03/27/2020   CL 105 03/27/2020   CO2 28 03/27/2020   GLUCOSE 78 03/27/2020   BUN 11 03/27/2020   CREATININE 0.79 03/27/2020   CALCIUM 9.1 03/27/2020   PROT 7.2 03/27/2020   ALBUMIN 4.0 03/27/2020   AST 21 03/27/2020   ALT 13 03/27/2020   ALKPHOS 63 03/27/2020   BILITOT 0.6 03/27/2020   GFRNONAA >60 03/27/2020   GFRAA >60 01/06/2019   Chromogranin A on 12/07/2019-65.4  Medications: I have reviewed the patient's current medications.   Assessment/Plan: 1. Metastatic neuroendocrine tumor ? MRI of the abdomen 10/02/2016 confirmed multiple enhancing liver lesions consistent with metastases, no primary tumor site identified ? Ultrasound-guided biopsy of a left liver lesion 10/07/2016-neuroendocrine neoplasm, "intermediate grade ", review of pathology at GI tumor  conference consistent with a low-grade carcinoid tumor ? Elevated chromogranin A 10/15/2016 ? gallium-DOTATATEscan 10/23/2016-multiple foci of liver metastases, enlarged central mesenteric lymph nodes, short intussusception's in adjacent small bowel felt to represent a primary small bowel tumor, additional mesenteric lymph nodes with metabolic activity, deep right pelvic nodule consistent with a pelvic metastasis ? Small bowel resection 11/21/2016-mid ileum mass, low-grade neuroendocrine tumor,pT4,pN2, 6/18 lymph nodes positive, positive mesenteric resection margin (lymph node), intramural satellite nodule ? CT 11/30/2016-small bowel obstruction with transition point adjacent to suture line at the distal small bowel, liver metastases similar to the 10/02/2016 MRI ? Recurrent obstructive symptoms requiring repeat surgery with resection of the small bowel anastomosis site 12/13/2016, pathology negative for malignancy ? Initiation of monthly Sandostatin 12/20/2016 ? CT abdomen/pelvis 02/19/2017-mild enlargement of liver lesions compared to November 2018 ? Cycle 1 Lutathera 06/17/2017 ? Cycle 2 Lutathera 08/13/2017 ? Cycle 3 Lutathera 10/07/2017 ? Cycle 4 Lutathera 12/02/2017 ? Dotatate scan 12/29/2017-no evidence of new or metastatic disease or progression.  Multiple hepatic metastasis.  Lesions have decreased in size from the most recent CT scan and slightly increased in radiotracer activity from dotatate PET scan 10/23/2016.  Interval resection of small bowel tumor and bulky mesenteric metastasis.  No residual activity in the bowel or mesentery.  Single small focus of activity in the deep right pelvis along the peritoneal surface not changed from comparison exam. ? Monthly Sandostatin continued ? Netspot 06/23/2018- mild decrease in radiotracer activity of all hepatic  metastases, no new lesions, slight enlargement of several liver lesions ? Monthly Sandostatin continued ? Netspot 03/31/2019-no new liver  lesions, no change in the size of liver lesions with a mild decrease in tracer activity involving several lesions, stable right pelvic peritoneal implant ? Monthly Sandostatin continued ? Netspot 12/07/2019-no new liver lesion, focus of activity left facet joint at C4-C5 has increased, intense activity associated with fractures of the left ninth and 10th ribs, unchanged size and number of liver lesions with increase in radiotracer activity-potentially indicating mild progression  2. 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 Intermittent abdominal bloating and left-sided abdominal pain- resolved  4.Admission 11/30/2016 with a small bowel obstruction      Disposition: Brittney [name] appears stable.  She will continue monthly Sandostatin.  She will be scheduled for a restaging Netspot in August.  We will follow up on the chromogranin a level from today.  She will return for an office visit after the Netspot in August.  We will check the chromogranin in July. The erythematous rash at the left abdominal wall may be related to a contact dermatitis or eczema.  She will observe the rash and try Benadryl cream.  We will make a dermatology referral if the rash progresses. [name], MD  03/27/2020  12:20 PM

## 2020-03-28 ENCOUNTER — Telehealth: Payer: Self-pay | Admitting: Oncology

## 2020-03-28 LAB — CHROMOGRANIN A: Chromogranin A (ng/mL): 69.4 ng/mL (ref 0.0–101.8)

## 2020-03-28 NOTE — Telephone Encounter (Signed)
Scheduled appt per 3/21 los - unable to reach pt . Left message for patient with appt date and time

## 2020-03-29 ENCOUNTER — Encounter: Payer: Self-pay | Admitting: Nurse Practitioner

## 2020-03-30 ENCOUNTER — Telehealth: Payer: Self-pay | Admitting: *Deleted

## 2020-03-30 ENCOUNTER — Other Ambulatory Visit: Payer: BC Managed Care – PPO

## 2020-03-30 ENCOUNTER — Ambulatory Visit: Payer: BC Managed Care – PPO | Admitting: Nurse Practitioner

## 2020-03-30 ENCOUNTER — Ambulatory Visit: Payer: BC Managed Care – PPO

## 2020-03-30 NOTE — Telephone Encounter (Signed)
-----   Message from Ladell Pier, MD sent at 03/28/2020  5:05 PM EDT ----- Please call patient, chromogranin a is stable, follow-up as scheduled

## 2020-03-30 NOTE — Telephone Encounter (Signed)
Notified of normal/stable Chromogranin A results.

## 2020-04-17 ENCOUNTER — Telehealth: Payer: Self-pay | Admitting: Oncology

## 2020-04-17 NOTE — Telephone Encounter (Signed)
Called pt back per telephone record faxed from [facility] - pt called outside of office hours for an appt. Called back no answer. Left message for patient to call back .

## 2020-04-25 ENCOUNTER — Other Ambulatory Visit: Payer: Self-pay

## 2020-04-25 ENCOUNTER — Inpatient Hospital Stay: Payer: BC Managed Care – PPO | Attending: Oncology

## 2020-04-25 VITALS — BP 101/69 | HR 53 | Temp 97.8°F | Resp 18 | Wt 131.0 lb

## 2020-04-25 DIAGNOSIS — E340 Carcinoid syndrome: Secondary | ICD-10-CM | POA: Diagnosis not present

## 2020-04-25 DIAGNOSIS — C7A019 Malignant carcinoid tumor of the small intestine, unspecified portion: Secondary | ICD-10-CM

## 2020-04-25 DIAGNOSIS — C7B02 Secondary carcinoid tumors of liver: Secondary | ICD-10-CM | POA: Insufficient documentation

## 2020-04-25 MED ORDER — OCTREOTIDE ACETATE 30 MG IM KIT
30.0000 mg | PACK | Freq: Once | INTRAMUSCULAR | Status: AC
  Administered 2020-04-25: 30 mg via INTRAMUSCULAR

## 2020-04-25 MED ORDER — OCTREOTIDE ACETATE 30 MG IM KIT
PACK | INTRAMUSCULAR | Status: AC
  Filled 2020-04-25: qty 1

## 2020-04-25 NOTE — Progress Notes (Unsigned)
Brittney [name] presents today for injection per MD orders. Sandostatin 30mg  administered IM in left Gluteal. Administration without incident. Patient tolerated well.

## 2020-05-25 ENCOUNTER — Other Ambulatory Visit: Payer: Self-pay

## 2020-05-25 ENCOUNTER — Inpatient Hospital Stay: Payer: BC Managed Care – PPO | Attending: Oncology

## 2020-05-25 VITALS — BP 99/76 | HR 82 | Temp 98.2°F | Resp 18

## 2020-05-25 DIAGNOSIS — E340 Carcinoid syndrome: Secondary | ICD-10-CM | POA: Diagnosis not present

## 2020-05-25 DIAGNOSIS — C7B02 Secondary carcinoid tumors of liver: Secondary | ICD-10-CM | POA: Diagnosis present

## 2020-05-25 DIAGNOSIS — C7A019 Malignant carcinoid tumor of the small intestine, unspecified portion: Secondary | ICD-10-CM | POA: Diagnosis present

## 2020-05-25 MED ORDER — OCTREOTIDE ACETATE 30 MG IM KIT
30.0000 mg | PACK | Freq: Once | INTRAMUSCULAR | Status: AC
  Administered 2020-05-25: 30 mg via INTRAMUSCULAR

## 2020-05-25 NOTE — Patient Instructions (Signed)
Octreotide injection solution What is this medicine? OCTREOTIDE (ok TREE oh tide) is used to reduce blood levels of growth hormone in patients with a condition called acromegaly. This medicine also reduces flushing and watery diarrhea caused by certain types of cancer. This medicine may be used for other purposes; ask your health care provider or pharmacist if you have questions. COMMON BRAND NAME(S): Bynfezia, Sandostatin What should I tell my health care provider before I take this medicine? They need to know if you have any of these conditions:  diabetes  gallbladder disease  kidney disease  liver disease  thyroid disease  an unusual or allergic reaction to octreotide, other medicines, foods, dyes, or preservatives  pregnant or trying to get pregnant  breast-feeding How should I use this medicine? This medicine is for injection under the skin or into a vein (only in emergency situations). It is usually given by a health care professional in a hospital or clinic setting. If you get this medicine at home, you will be taught how to prepare and give this medicine. Allow the injection solution to come to room temperature before use. Do not warm it artificially. Use exactly as directed. Take your medicine at regular intervals. Do not take your medicine more often than directed. It is important that you put your used needles and syringes in a special sharps container. Do not put them in a trash can. If you do not have a sharps container, call your pharmacist or healthcare provider to get one. Talk to your pediatrician regarding the use of this medicine in children. Special care may be needed. Overdosage: If you think you have taken too much of this medicine contact a poison control center or emergency room at once. NOTE: This medicine is only for you. Do not share this medicine with others. What if I miss a dose? If you miss a dose, take it as soon as you can. If it is almost time for your  next dose, take only that dose. Do not take double or extra doses. What may interact with this medicine?  bromocriptine  certain medicines for blood pressure, heart disease, irregular heartbeat  cyclosporine  diuretics  medicines for diabetes, including insulin  quinidine This list may not describe all possible interactions. Give your health care provider a list of all the medicines, herbs, non-prescription drugs, or dietary supplements you use. Also tell them if you smoke, drink alcohol, or use illegal drugs. Some items may interact with your medicine. What should I watch for while using this medicine? Visit your doctor or health care professional for regular checks on your progress. To help reduce irritation at the injection site, use a different site for each injection and make sure the solution is at room temperature before use. This medicine may cause decreases in blood sugar. Signs of low blood sugar include chills, cool, pale skin or cold sweats, drowsiness, extreme hunger, fast heartbeat, headache, nausea, nervousness or anxiety, shakiness, trembling, unsteadiness, tiredness, or weakness. Contact your doctor or health care professional right away if you experience any of these symptoms. This medicine may increase blood sugar. Ask your healthcare provider if changes in diet or medicines are needed if you have diabetes. This medicine may cause a decrease in vitamin B12. You should make sure that you get enough vitamin B12 while you are taking this medicine. Discuss the foods you eat and the vitamins you take with your health care professional. What side effects may I notice from receiving this medicine? Side   effects that you should report to your doctor or health care professional as soon as possible:  allergic reactions like skin rash, itching or hives, swelling of the face, lips, or tongue  fast, slow, or irregular heartbeat  right upper belly pain  severe stomach pain  signs  and symptoms of high blood sugar such as being more thirsty or hungry or having to urinate more than normal. You may also feel very tired or have blurry vision.  signs and symptoms of low blood sugar such as feeling anxious; confusion; dizziness; increased hunger; unusually weak or tired; increased sweating; shakiness; cold, clammy skin; irritable; headache; blurred vision; fast heartbeat; loss of consciousness  unusually weak or tired Side effects that usually do not require medical attention (report to your doctor or health care professional if they continue or are bothersome):  diarrhea  dizziness  gas  headache  nausea, vomiting  pain, redness, or irritation at site where injected  upset stomach This list may not describe all possible side effects. Call your doctor for medical advice about side effects. You may report side effects to FDA at 1-800-FDA-1088. Where should I keep my medicine? Keep out of the reach of children. Store in a refrigerator between 2 and 8 degrees C (36 and 46 degrees F). Protect from light. Allow to come to room temperature naturally. Do not use artificial heat. If protected from light, the injection may be stored at room temperature between 20 and 30 degrees C (70 and 86 degrees F) for 14 days. After the initial use, throw away any unused portion of a multiple dose vial after 14 days. Throw away unused portions of the ampules after use. NOTE: This sheet is a summary. It may not cover all possible information. If you have questions about this medicine, talk to your doctor, pharmacist, or health care provider.  2021 [employer]vier/Gold Standard (2018-07-23 13:33:09)  

## 2020-06-18 ENCOUNTER — Encounter: Payer: Self-pay | Admitting: Oncology

## 2020-06-19 ENCOUNTER — Telehealth: Payer: Self-pay | Admitting: Oncology

## 2020-06-19 NOTE — Telephone Encounter (Signed)
Called pt per 6/13 sch msg - left message with new appt date and time

## 2020-06-20 ENCOUNTER — Inpatient Hospital Stay: Payer: BC Managed Care – PPO | Attending: Oncology

## 2020-06-20 ENCOUNTER — Other Ambulatory Visit: Payer: Self-pay

## 2020-06-20 VITALS — BP 102/76 | HR 53 | Temp 97.7°F | Resp 18

## 2020-06-20 DIAGNOSIS — Z79899 Other long term (current) drug therapy: Secondary | ICD-10-CM | POA: Insufficient documentation

## 2020-06-20 DIAGNOSIS — C7A019 Malignant carcinoid tumor of the small intestine, unspecified portion: Secondary | ICD-10-CM | POA: Insufficient documentation

## 2020-06-20 DIAGNOSIS — C7B02 Secondary carcinoid tumors of liver: Secondary | ICD-10-CM | POA: Diagnosis present

## 2020-06-20 MED ORDER — OCTREOTIDE ACETATE 30 MG IM KIT
30.0000 mg | PACK | Freq: Once | INTRAMUSCULAR | Status: AC
  Administered 2020-06-20: 13:00:00 30 mg via INTRAMUSCULAR

## 2020-06-20 NOTE — Patient Instructions (Signed)
Octreotide injection solution What is this medication? OCTREOTIDE (ok TREE oh tide) is used to reduce blood levels of growth hormone in patients with a condition called acromegaly. This medicine also reduces flushing and watery diarrhea caused by certain types of cancer. This medicine may be used for other purposes; ask your health care provider or pharmacist if you have questions. COMMON BRAND NAME(S): Bynfezia, Sandostatin What should I tell my care team before I take this medication? They need to know if you have any of these conditions: diabetes gallbladder disease kidney disease liver disease thyroid disease an unusual or allergic reaction to octreotide, other medicines, foods, dyes, or preservatives pregnant or trying to get pregnant breast-feeding How should I use this medication? This medicine is for injection under the skin or into a vein (only in emergency situations). It is usually given by a health care professional in a hospital or clinic setting. If you get this medicine at home, you will be taught how to prepare and give this medicine. Allow the injection solution to come to room temperature before use. Do not warm it artificially. Use exactly as directed. Take your medicine at regular intervals. Do not take your medicine more often than directed. It is important that you put your used needles and syringes in a special sharps container. Do not put them in a trash can. If you do not have a sharps container, call your pharmacist or healthcare provider to get one. Talk to your pediatrician regarding the use of this medicine in children. Special care may be needed. Overdosage: If you think you have taken too much of this medicine contact a poison control center or emergency room at once. NOTE: This medicine is only for you. Do not share this medicine with others. What if I miss a dose? If you miss a dose, take it as soon as you can. If it is almost time for your next dose, take only  that dose. Do not take double or extra doses. What may interact with this medication? bromocriptine certain medicines for blood pressure, heart disease, irregular heartbeat cyclosporine diuretics medicines for diabetes, including insulin quinidine This list may not describe all possible interactions. Give your health care provider a list of all the medicines, herbs, non-prescription drugs, or dietary supplements you use. Also tell them if you smoke, drink alcohol, or use illegal drugs. Some items may interact with your medicine. What should I watch for while using this medication? Visit your doctor or health care professional for regular checks on your progress. To help reduce irritation at the injection site, use a different site for each injection and make sure the solution is at room temperature before use. This medicine may cause decreases in blood sugar. Signs of low blood sugar include chills, cool, pale skin or cold sweats, drowsiness, extreme hunger, fast heartbeat, headache, nausea, nervousness or anxiety, shakiness, trembling, unsteadiness, tiredness, or weakness. Contact your doctor or health care professional right away if you experience any of these symptoms. This medicine may increase blood sugar. Ask your healthcare provider if changes in diet or medicines are needed if you have diabetes. This medicine may cause a decrease in vitamin B12. You should make sure that you get enough vitamin B12 while you are taking this medicine. Discuss the foods you eat and the vitamins you take with your health care professional. What side effects may I notice from receiving this medication? Side effects that you should report to your doctor or health care professional as soon as   possible: allergic reactions like skin rash, itching or hives, swelling of the face, lips, or tongue fast, slow, or irregular heartbeat right upper belly pain severe stomach pain signs and symptoms of high blood sugar such  as being more thirsty or hungry or having to urinate more than normal. You may also feel very tired or have blurry vision. signs and symptoms of low blood sugar such as feeling anxious; confusion; dizziness; increased hunger; unusually weak or tired; increased sweating; shakiness; cold, clammy skin; irritable; headache; blurred vision; fast heartbeat; loss of consciousness unusually weak or tired Side effects that usually do not require medical attention (report to your doctor or health care professional if they continue or are bothersome): diarrhea dizziness gas headache nausea, vomiting pain, redness, or irritation at site where injected upset stomach This list may not describe all possible side effects. Call your doctor for medical advice about side effects. You may report side effects to FDA at 1-800-FDA-1088. Where should I keep my medication? Keep out of the reach of children. Store in a refrigerator between 2 and 8 degrees C (36 and 46 degrees F). Protect from light. Allow to come to room temperature naturally. Do not use artificial heat. If protected from light, the injection may be stored at room temperature between 20 and 30 degrees C (70 and 86 degrees F) for 14 days. After the initial use, throw away any unused portion of a multiple dose vial after 14 days. Throw away unused portions of the ampules after use. NOTE: This sheet is a summary. It may not cover all possible information. If you have questions about this medicine, talk to your doctor, pharmacist, or health care provider.  2022 Elsevier/Gold Standard (2018-07-23 13:33:09)  

## 2020-07-17 ENCOUNTER — Inpatient Hospital Stay: Payer: BC Managed Care – PPO | Attending: Oncology

## 2020-07-17 ENCOUNTER — Inpatient Hospital Stay: Payer: BC Managed Care – PPO

## 2020-07-17 ENCOUNTER — Other Ambulatory Visit: Payer: Self-pay

## 2020-07-17 VITALS — BP 99/77 | HR 58 | Temp 98.2°F | Resp 18

## 2020-07-17 DIAGNOSIS — Z79899 Other long term (current) drug therapy: Secondary | ICD-10-CM | POA: Insufficient documentation

## 2020-07-17 DIAGNOSIS — C7A019 Malignant carcinoid tumor of the small intestine, unspecified portion: Secondary | ICD-10-CM

## 2020-07-17 DIAGNOSIS — C7B02 Secondary carcinoid tumors of liver: Secondary | ICD-10-CM | POA: Insufficient documentation

## 2020-07-17 MED ORDER — OCTREOTIDE ACETATE 30 MG IM KIT
30.0000 mg | PACK | Freq: Once | INTRAMUSCULAR | Status: AC
  Administered 2020-07-17: 30 mg via INTRAMUSCULAR

## 2020-07-17 NOTE — Progress Notes (Signed)
Patient presents today for her Sandostatin IM Injection. She expressed concern regarding mode of administration stating that when injection is administered wrong, she ends up sick for weeks after. She also stated that she still has a knot in her left buttocks from the administration received 8 weeks ago. This nurse discussed right administration mode and technique with patient. Nurse also assured patient that medication has been sitting in room temperature for at least 2 hours and so not cold since this was also a concern expressed by patient. Per patient's request, injection was administered to her right deep intragluteal. Patient denied any complaints or concerns at this time and declined receiving her AVS.

## 2020-07-17 NOTE — Patient Instructions (Signed)
Octreotide injection solution What is this medication? OCTREOTIDE (ok TREE oh tide) is used to reduce blood levels of growth hormone in patients with a condition called acromegaly. This medicine also reduces flushing and watery diarrhea caused by certain types of cancer. This medicine may be used for other purposes; ask your health care provider or pharmacist if you have questions. COMMON BRAND NAME(S): Bynfezia, Sandostatin What should I tell my care team before I take this medication? They need to know if you have any of these conditions: diabetes gallbladder disease kidney disease liver disease thyroid disease an unusual or allergic reaction to octreotide, other medicines, foods, dyes, or preservatives pregnant or trying to get pregnant breast-feeding How should I use this medication? This medicine is for injection under the skin or into a vein (only in emergency situations). It is usually given by a health care professional in a hospital or clinic setting. If you get this medicine at home, you will be taught how to prepare and give this medicine. Allow the injection solution to come to room temperature before use. Do not warm it artificially. Use exactly as directed. Take your medicine at regular intervals. Do not take your medicine more often than directed. It is important that you put your used needles and syringes in a special sharps container. Do not put them in a trash can. If you do not have a sharps container, call your pharmacist or healthcare provider to get one. Talk to your pediatrician regarding the use of this medicine in children. Special care may be needed. Overdosage: If you think you have taken too much of this medicine contact a poison control center or emergency room at once. NOTE: This medicine is only for you. Do not share this medicine with others. What if I miss a dose? If you miss a dose, take it as soon as you can. If it is almost time for your next dose, take only  that dose. Do not take double or extra doses. What may interact with this medication? bromocriptine certain medicines for blood pressure, heart disease, irregular heartbeat cyclosporine diuretics medicines for diabetes, including insulin quinidine This list may not describe all possible interactions. Give your health care provider a list of all the medicines, herbs, non-prescription drugs, or dietary supplements you use. Also tell them if you smoke, drink alcohol, or use illegal drugs. Some items may interact with your medicine. What should I watch for while using this medication? Visit your doctor or health care professional for regular checks on your progress. To help reduce irritation at the injection site, use a different site for each injection and make sure the solution is at room temperature before use. This medicine may cause decreases in blood sugar. Signs of low blood sugar include chills, cool, pale skin or cold sweats, drowsiness, extreme hunger, fast heartbeat, headache, nausea, nervousness or anxiety, shakiness, trembling, unsteadiness, tiredness, or weakness. Contact your doctor or health care professional right away if you experience any of these symptoms. This medicine may increase blood sugar. Ask your healthcare provider if changes in diet or medicines are needed if you have diabetes. This medicine may cause a decrease in vitamin B12. You should make sure that you get enough vitamin B12 while you are taking this medicine. Discuss the foods you eat and the vitamins you take with your health care professional. What side effects may I notice from receiving this medication? Side effects that you should report to your doctor or health care professional as soon as   possible: allergic reactions like skin rash, itching or hives, swelling of the face, lips, or tongue fast, slow, or irregular heartbeat right upper belly pain severe stomach pain signs and symptoms of high blood sugar such  as being more thirsty or hungry or having to urinate more than normal. You may also feel very tired or have blurry vision. signs and symptoms of low blood sugar such as feeling anxious; confusion; dizziness; increased hunger; unusually weak or tired; increased sweating; shakiness; cold, clammy skin; irritable; headache; blurred vision; fast heartbeat; loss of consciousness unusually weak or tired Side effects that usually do not require medical attention (report to your doctor or health care professional if they continue or are bothersome): diarrhea dizziness gas headache nausea, vomiting pain, redness, or irritation at site where injected upset stomach This list may not describe all possible side effects. Call your doctor for medical advice about side effects. You may report side effects to FDA at 1-800-FDA-1088. Where should I keep my medication? Keep out of the reach of children. Store in a refrigerator between 2 and 8 degrees C (36 and 46 degrees F). Protect from light. Allow to come to room temperature naturally. Do not use artificial heat. If protected from light, the injection may be stored at room temperature between 20 and 30 degrees C (70 and 86 degrees F) for 14 days. After the initial use, throw away any unused portion of a multiple dose vial after 14 days. Throw away unused portions of the ampules after use. NOTE: This sheet is a summary. It may not cover all possible information. If you have questions about this medicine, talk to your doctor, pharmacist, or health care provider.  2022 Elsevier/Gold Standard (2018-07-23 13:33:09)  

## 2020-07-18 ENCOUNTER — Other Ambulatory Visit: Payer: BC Managed Care – PPO

## 2020-07-18 ENCOUNTER — Ambulatory Visit: Payer: Self-pay

## 2020-07-18 LAB — CHROMOGRANIN A: Chromogranin A (ng/mL): 77.4 ng/mL (ref 0.0–101.8)

## 2020-08-14 ENCOUNTER — Inpatient Hospital Stay: Payer: BC Managed Care – PPO | Attending: Oncology | Admitting: Oncology

## 2020-08-14 ENCOUNTER — Ambulatory Visit (HOSPITAL_COMMUNITY)
Admission: RE | Admit: 2020-08-14 | Discharge: 2020-08-14 | Disposition: A | Discharge status: Home / Self Care | Payer: BC Managed Care – PPO | Source: Ambulatory Visit | Attending: Oncology | Admitting: Oncology

## 2020-08-14 ENCOUNTER — Other Ambulatory Visit: Payer: Self-pay

## 2020-08-14 ENCOUNTER — Inpatient Hospital Stay: Payer: BC Managed Care – PPO

## 2020-08-14 VITALS — BP 104/69 | HR 73 | Temp 97.8°F | Resp 18 | Ht 65.0 in | Wt 131.4 lb

## 2020-08-14 DIAGNOSIS — C7A019 Malignant carcinoid tumor of the small intestine, unspecified portion: Secondary | ICD-10-CM | POA: Diagnosis present

## 2020-08-14 DIAGNOSIS — C7B02 Secondary carcinoid tumors of liver: Secondary | ICD-10-CM | POA: Insufficient documentation

## 2020-08-14 DIAGNOSIS — Z79899 Other long term (current) drug therapy: Secondary | ICD-10-CM | POA: Diagnosis not present

## 2020-08-14 MED ORDER — GALLIUM GA 68 DOTATATE IV KIT
3.1800 | PACK | Freq: Once | INTRAVENOUS | Status: AC | PRN
  Administered 2020-08-14: 3.18 via INTRAVENOUS

## 2020-08-14 MED ORDER — OCTREOTIDE ACETATE 30 MG IM KIT
30.0000 mg | PACK | Freq: Once | INTRAMUSCULAR | Status: AC
  Administered 2020-08-14: 30 mg via INTRAMUSCULAR

## 2020-08-14 NOTE — Progress Notes (Signed)
[place] OFFICE PROGRESS NOTE   Diagnosis: Carcinoid tumor  INTERVAL HISTORY:   Brittney [name] returns as scheduled.  She continues monthly Sandostatin.  No new complaint.  Good appetite and energy level.  She is exercising.  No diarrhea.  Objective:  Vital signs in last 24 hours:  Blood pressure 104/69, pulse 73, temperature 97.8 F (36.6 C), temperature source Oral, resp. rate 18, height '5\' 5"'$  (1.651 m), weight 131 lb 6.4 oz (59.6 kg), last menstrual period 01/31/2014, SpO2 99 %.    Resp: Lungs clear bilaterally Cardio: Regular rate and rhythm GI: No hepatosplenomegaly, no mass, nondistended Vascular: No leg edema  Lab Results:  Lab Results  Component Value Date   WBC 4.5 03/27/2020   HGB 13.6 03/27/2020   HCT 42.0 03/27/2020   MCV 94.0 03/27/2020   PLT 172 03/27/2020   NEUTROABS 2.1 03/27/2020    CMP  Lab Results  Component Value Date   NA 139 03/27/2020   K 4.3 03/27/2020   CL 105 03/27/2020   CO2 28 03/27/2020   GLUCOSE 78 03/27/2020   BUN 11 03/27/2020   CREATININE 0.79 03/27/2020   CALCIUM 9.1 03/27/2020   PROT 7.2 03/27/2020   ALBUMIN 4.0 03/27/2020   AST 21 03/27/2020   ALT 13 03/27/2020   ALKPHOS 63 03/27/2020   BILITOT 0.6 03/27/2020   GFRNONAA >60 03/27/2020   GFRAA >60 01/06/2019    Imaging:  NM PET (NETSPOT GA 56 DOTATATE) SKULL BASE TO MID THIGH  Result Date: 08/14/2020 CLINICAL DATA:  Subsequent treatment strategy for neuroendocrine tumor. 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 Patient status peptide receptor radiotherapy ([facility]) completed December 02, 2017. EXAM: NUCLEAR MEDICINE PET SKULL BASE TO THIGH TECHNIQUE: 3.2 mCi gallium 68 DOTATATE was injected intravenously. Full-ring PET imaging was performed from the skull base to thigh after the radiotracer. CT data was obtained and used for attenuation correction and anatomic localization. COMPARISON:  None. FINDINGS: NECK No radiotracer  activity in neck lymph nodes. Incidental CT findings: None CHEST No radiotracer accumulation within mediastinal or hilar lymph nodes. No suspicious pulmonary nodules on the CT scan. Incidental CT finding:None ABDOMEN/PELVIS Again demonstrated radiotracer avid lesions within the LEFT and RIGHT hepatic lobe. Lesions are fairly well demarcated on the noncontrast CT. Lesions are not changed significantly in size and are decreased in radiotracer activity compared to prior. Example lesions: Lesion in the posterior RIGHT hepatic lobe measuring 2.7 cm (image 97) with SUV max equal 13.3 compares to 3.0 cm lesion on DOTATATE PET scan 12/07/2019 with SUV max equal 17.4. Lesion in the lateral segment of the LEFT hepatic lobe measures 2.2 cm (image 106) compared to 2.1 cm with SUV max equal 17.1 compared SUV max equal 26.1. Lesion in the anterior aspect of the central liver measures 0.8 cm (image 100) compared to 0.9 cm with SUV max equal 14.5 compared SUV max equal 20.1. There are no new or enlarged lesions. No lesions which are increased in radiotracer activity. The small peritoneal implant in the deep RIGHT pelvis is difficult define by CT imaging but has decreased radiotracer activity with SUV max equal 3.5 compared SUV max equal 7.6 (image 170). No new peritoneal lesions. Patient status post partial small bowel resection. Physiologic activity noted throughout the small bowel. Physiologic activity noted in the liver, spleen, adrenal glands and kidneys. Incidental CT findings:None SKELETON Focal activity localizing to the LEFT cervical spine at C4-C5 is decreased with SUV max equal 5.7 compared SUV max  equal 6.8. There is facet hypertrophy at this level as described previously. Focal activity previously localizing to the LEFT posterior rib has resolved in the interval. Mild radiotracer activity associated with endplate sclerosis of the thoracic spine has decreased in the interval. For example midthoracic spine lesion on SUV  max equal 2.4 compares to SUV max equal 4.5. Incidental CT findings:None IMPRESSION: 1. No increase in size of hepatic lesions with interval decrease in radiotracer activity. 2. Decreased radiotracer activity of small deep RIGHT pelvic peritoneal implant. 3. No evidence of new or progressive neuroendocrine tumor. 4. Radiotracer activity associated with presumed degenerative change in the cervical and thoracic spine is decreased in the interval. Electronically Signed   By: Suzy Bouchard M.D.   On: 08/14/2020 10:59    Medications: I have reviewed the patient's current medications.   Assessment/Plan: Metastatic neuroendocrine tumor MRI of the abdomen 10/02/2016 confirmed multiple enhancing liver lesions consistent with metastases, no primary tumor site identified Ultrasound-guided biopsy of a left liver lesion 10/07/2016-neuroendocrine neoplasm, "intermediate grade ", review of pathology at GI tumor conference consistent with a low-grade carcinoid tumor Elevated chromogranin A 10/15/2016  gallium-DOTATATE scan 10/23/2016-multiple foci of liver metastases, enlarged central mesenteric lymph nodes, short intussusception's in adjacent small bowel felt to represent a primary small bowel tumor, additional mesenteric lymph nodes with metabolic activity, deep right pelvic nodule consistent with a pelvic metastasis Small bowel resection 11/21/2016- mid ileum mass, low-grade neuroendocrine tumor,pT4,pN2, 6/18 lymph nodes positive, positive mesenteric resection margin (lymph node), intramural satellite nodule CT 11/30/2016-small bowel obstruction with transition point adjacent to suture line at the distal small bowel, liver metastases similar to the 10/02/2016 MRI Recurrent obstructive symptoms requiring repeat surgery with resection of the small bowel anastomosis site 12/13/2016, pathology negative for malignancy Initiation of monthly Sandostatin 12/20/2016 CT abdomen/pelvis 02/19/2017-mild enlargement of liver  lesions compared to November 2018 Cycle 1 Lutathera 06/17/2017 Cycle 2 Lutathera 08/13/2017 Cycle 3 Lutathera 10/07/2017 Cycle 4 Lutathera 12/02/2017 Dotatate scan 12/29/2017-no evidence of new or metastatic disease or progression.  Multiple hepatic metastasis.  Lesions have decreased in size from the most recent CT scan and slightly increased in radiotracer activity from dotatate PET scan 10/23/2016.  Interval resection of small bowel tumor and bulky mesenteric metastasis.  No residual activity in the bowel or mesentery.  Single small focus of activity in the deep right pelvis along the peritoneal surface not changed from comparison exam. Monthly Sandostatin continued Netspot 06/23/2018- mild decrease in radiotracer activity of all hepatic metastases, no new lesions, slight enlargement of several liver lesions Monthly Sandostatin continued Netspot 03/31/2019-no new liver lesions, no change in the size of liver lesions with a mild decrease in tracer activity involving several lesions, stable right pelvic peritoneal implant Monthly Sandostatin continued Netspot 12/07/2019-no new liver lesion, focus of activity left facet joint at C4-C5 has increased, intense activity associated with fractures of the left ninth and 10th ribs, unchanged size and number of liver lesions with increase in radiotracer activity-potentially indicating mild progression Netspot 08/14/2020-decrease in radiotracer activity associated with liver lesions, no new lesions, the liver lesions have not enlarged, decreased radiotracer activity associated with a small right pelvic peritoneal implant, decreased activity associated with degenerative changes in the cervical and thoracic spine, no evidence of progressive disease Monthly Sandostatin continue   2.  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  Intermittent abdominal bloating and left-sided abdominal pain-  resolved   4.  Admission 11/30/2016 with a  small bowel obstruction    Disposition: Brittney [name] appears stable.  I reviewed the Netspot images and results with Brittney [name] and her husband.  There is no clinical or radiologic evidence of disease progression.  The chromogranin a level was stable last month.  The plan is to continue monthly Sandostatin.  We will check a chromogranin a level in 3 months.  She will be scheduled for an office visit in 4 months.  [name], MD  08/14/2020  3:16 PM

## 2020-08-14 NOTE — Patient Instructions (Signed)
Octreotide injection solution What is this medication? OCTREOTIDE (ok TREE oh tide) is used to reduce blood levels of growth hormone in patients with a condition called acromegaly. This medicine also reduces flushing and watery diarrhea caused by certain types of cancer. This medicine may be used for other purposes; ask your health care provider or pharmacist if you have questions. COMMON BRAND NAME(S): Bynfezia, Sandostatin What should I tell my care team before I take this medication? They need to know if you have any of these conditions: diabetes gallbladder disease kidney disease liver disease thyroid disease an unusual or allergic reaction to octreotide, other medicines, foods, dyes, or preservatives pregnant or trying to get pregnant breast-feeding How should I use this medication? This medicine is for injection under the skin or into a vein (only in emergency situations). It is usually given by a health care professional in a hospital or clinic setting. If you get this medicine at home, you will be taught how to prepare and give this medicine. Allow the injection solution to come to room temperature before use. Do not warm it artificially. Use exactly as directed. Take your medicine at regular intervals. Do not take your medicine more often than directed. It is important that you put your used needles and syringes in a special sharps container. Do not put them in a trash can. If you do not have a sharps container, call your pharmacist or healthcare provider to get one. Talk to your pediatrician regarding the use of this medicine in children. Special care may be needed. Overdosage: If you think you have taken too much of this medicine contact a poison control center or emergency room at once. NOTE: This medicine is only for you. Do not share this medicine with others. What if I miss a dose? If you miss a dose, take it as soon as you can. If it is almost time for your next dose, take only  that dose. Do not take double or extra doses. What may interact with this medication? bromocriptine certain medicines for blood pressure, heart disease, irregular heartbeat cyclosporine diuretics medicines for diabetes, including insulin quinidine This list may not describe all possible interactions. Give your health care provider a list of all the medicines, herbs, non-prescription drugs, or dietary supplements you use. Also tell them if you smoke, drink alcohol, or use illegal drugs. Some items may interact with your medicine. What should I watch for while using this medication? Visit your doctor or health care professional for regular checks on your progress. To help reduce irritation at the injection site, use a different site for each injection and make sure the solution is at room temperature before use. This medicine may cause decreases in blood sugar. Signs of low blood sugar include chills, cool, pale skin or cold sweats, drowsiness, extreme hunger, fast heartbeat, headache, nausea, nervousness or anxiety, shakiness, trembling, unsteadiness, tiredness, or weakness. Contact your doctor or health care professional right away if you experience any of these symptoms. This medicine may increase blood sugar. Ask your healthcare provider if changes in diet or medicines are needed if you have diabetes. This medicine may cause a decrease in vitamin B12. You should make sure that you get enough vitamin B12 while you are taking this medicine. Discuss the foods you eat and the vitamins you take with your health care professional. What side effects may I notice from receiving this medication? Side effects that you should report to your doctor or health care professional as soon as   possible: allergic reactions like skin rash, itching or hives, swelling of the face, lips, or tongue fast, slow, or irregular heartbeat right upper belly pain severe stomach pain signs and symptoms of high blood sugar such  as being more thirsty or hungry or having to urinate more than normal. You may also feel very tired or have blurry vision. signs and symptoms of low blood sugar such as feeling anxious; confusion; dizziness; increased hunger; unusually weak or tired; increased sweating; shakiness; cold, clammy skin; irritable; headache; blurred vision; fast heartbeat; loss of consciousness unusually weak or tired Side effects that usually do not require medical attention (report to your doctor or health care professional if they continue or are bothersome): diarrhea dizziness gas headache nausea, vomiting pain, redness, or irritation at site where injected upset stomach This list may not describe all possible side effects. Call your doctor for medical advice about side effects. You may report side effects to FDA at 1-800-FDA-1088. Where should I keep my medication? Keep out of the reach of children. Store in a refrigerator between 2 and 8 degrees C (36 and 46 degrees F). Protect from light. Allow to come to room temperature naturally. Do not use artificial heat. If protected from light, the injection may be stored at room temperature between 20 and 30 degrees C (70 and 86 degrees F) for 14 days. After the initial use, throw away any unused portion of a multiple dose vial after 14 days. Throw away unused portions of the ampules after use. NOTE: This sheet is a summary. It may not cover all possible information. If you have questions about this medicine, talk to your doctor, pharmacist, or health care provider.  2022 Elsevier/Gold Standard (2018-07-23 13:33:09)  

## 2020-08-15 ENCOUNTER — Ambulatory Visit: Payer: Self-pay | Admitting: Nurse Practitioner

## 2020-08-15 ENCOUNTER — Ambulatory Visit: Payer: Self-pay

## 2020-09-12 ENCOUNTER — Other Ambulatory Visit: Payer: Self-pay

## 2020-09-12 ENCOUNTER — Inpatient Hospital Stay: Payer: BC Managed Care – PPO | Attending: Oncology

## 2020-09-12 VITALS — BP 99/73 | HR 56 | Temp 98.2°F | Resp 18

## 2020-09-12 DIAGNOSIS — Z79899 Other long term (current) drug therapy: Secondary | ICD-10-CM | POA: Diagnosis not present

## 2020-09-12 DIAGNOSIS — C7A019 Malignant carcinoid tumor of the small intestine, unspecified portion: Secondary | ICD-10-CM | POA: Diagnosis present

## 2020-09-12 DIAGNOSIS — C7B02 Secondary carcinoid tumors of liver: Secondary | ICD-10-CM | POA: Insufficient documentation

## 2020-09-12 MED ORDER — OCTREOTIDE ACETATE 30 MG IM KIT
30.0000 mg | PACK | Freq: Once | INTRAMUSCULAR | Status: AC
  Administered 2020-09-12: 30 mg via INTRAMUSCULAR

## 2020-09-12 NOTE — Patient Instructions (Signed)
Octreotide injection solution What is this medication? OCTREOTIDE (ok TREE oh tide) is used to reduce blood levels of growth hormone in patients with a condition called acromegaly. This medicine also reduces flushing and watery diarrhea caused by certain types of cancer. This medicine may be used for other purposes; ask your health care provider or pharmacist if you have questions. COMMON BRAND NAME(S): Bynfezia, Sandostatin What should I tell my care team before I take this medication? They need to know if you have any of these conditions: diabetes gallbladder disease kidney disease liver disease thyroid disease an unusual or allergic reaction to octreotide, other medicines, foods, dyes, or preservatives pregnant or trying to get pregnant breast-feeding How should I use this medication? This medicine is for injection under the skin or into a vein (only in emergency situations). It is usually given by a health care professional in a hospital or clinic setting. If you get this medicine at home, you will be taught how to prepare and give this medicine. Allow the injection solution to come to room temperature before use. Do not warm it artificially. Use exactly as directed. Take your medicine at regular intervals. Do not take your medicine more often than directed. It is important that you put your used needles and syringes in a special sharps container. Do not put them in a trash can. If you do not have a sharps container, call your pharmacist or healthcare provider to get one. Talk to your pediatrician regarding the use of this medicine in children. Special care may be needed. Overdosage: If you think you have taken too much of this medicine contact a poison control center or emergency room at once. NOTE: This medicine is only for you. Do not share this medicine with others. What if I miss a dose? If you miss a dose, take it as soon as you can. If it is almost time for your next dose, take only  that dose. Do not take double or extra doses. What may interact with this medication? bromocriptine certain medicines for blood pressure, heart disease, irregular heartbeat cyclosporine diuretics medicines for diabetes, including insulin quinidine This list may not describe all possible interactions. Give your health care provider a list of all the medicines, herbs, non-prescription drugs, or dietary supplements you use. Also tell them if you smoke, drink alcohol, or use illegal drugs. Some items may interact with your medicine. What should I watch for while using this medication? Visit your doctor or health care professional for regular checks on your progress. To help reduce irritation at the injection site, use a different site for each injection and make sure the solution is at room temperature before use. This medicine may cause decreases in blood sugar. Signs of low blood sugar include chills, cool, pale skin or cold sweats, drowsiness, extreme hunger, fast heartbeat, headache, nausea, nervousness or anxiety, shakiness, trembling, unsteadiness, tiredness, or weakness. Contact your doctor or health care professional right away if you experience any of these symptoms. This medicine may increase blood sugar. Ask your healthcare provider if changes in diet or medicines are needed if you have diabetes. This medicine may cause a decrease in vitamin B12. You should make sure that you get enough vitamin B12 while you are taking this medicine. Discuss the foods you eat and the vitamins you take with your health care professional. What side effects may I notice from receiving this medication? Side effects that you should report to your doctor or health care professional as soon as   possible: allergic reactions like skin rash, itching or hives, swelling of the face, lips, or tongue fast, slow, or irregular heartbeat right upper belly pain severe stomach pain signs and symptoms of high blood sugar such  as being more thirsty or hungry or having to urinate more than normal. You may also feel very tired or have blurry vision. signs and symptoms of low blood sugar such as feeling anxious; confusion; dizziness; increased hunger; unusually weak or tired; increased sweating; shakiness; cold, clammy skin; irritable; headache; blurred vision; fast heartbeat; loss of consciousness unusually weak or tired Side effects that usually do not require medical attention (report to your doctor or health care professional if they continue or are bothersome): diarrhea dizziness gas headache nausea, vomiting pain, redness, or irritation at site where injected upset stomach This list may not describe all possible side effects. Call your doctor for medical advice about side effects. You may report side effects to FDA at 1-800-FDA-1088. Where should I keep my medication? Keep out of the reach of children. Store in a refrigerator between 2 and 8 degrees C (36 and 46 degrees F). Protect from light. Allow to come to room temperature naturally. Do not use artificial heat. If protected from light, the injection may be stored at room temperature between 20 and 30 degrees C (70 and 86 degrees F) for 14 days. After the initial use, throw away any unused portion of a multiple dose vial after 14 days. Throw away unused portions of the ampules after use. NOTE: This sheet is a summary. It may not cover all possible information. If you have questions about this medicine, talk to your doctor, pharmacist, or health care provider.  2022 Elsevier/Gold Standard (2018-07-23 13:33:09)  

## 2020-10-10 ENCOUNTER — Inpatient Hospital Stay: Payer: BC Managed Care – PPO | Attending: Oncology

## 2020-10-10 ENCOUNTER — Other Ambulatory Visit: Payer: Self-pay

## 2020-10-10 VITALS — BP 108/78 | HR 59 | Temp 98.2°F | Resp 18

## 2020-10-10 DIAGNOSIS — C7A019 Malignant carcinoid tumor of the small intestine, unspecified portion: Secondary | ICD-10-CM | POA: Insufficient documentation

## 2020-10-10 DIAGNOSIS — C7B02 Secondary carcinoid tumors of liver: Secondary | ICD-10-CM | POA: Diagnosis present

## 2020-10-10 DIAGNOSIS — Z79899 Other long term (current) drug therapy: Secondary | ICD-10-CM | POA: Diagnosis not present

## 2020-10-10 MED ORDER — OCTREOTIDE ACETATE 30 MG IM KIT
30.0000 mg | PACK | Freq: Once | INTRAMUSCULAR | Status: AC
  Administered 2020-10-10: 30 mg via INTRAMUSCULAR

## 2020-10-10 NOTE — Patient Instructions (Signed)
Octreotide injection solution What is this medication? OCTREOTIDE (ok TREE oh tide) is used to reduce blood levels of growth hormone in patients with a condition called acromegaly. This medicine also reduces flushing and watery diarrhea caused by certain types of cancer. This medicine may be used for other purposes; ask your health care provider or pharmacist if you have questions. COMMON BRAND NAME(S): Bynfezia, Sandostatin What should I tell my care team before I take this medication? They need to know if you have any of these conditions: diabetes gallbladder disease kidney disease liver disease thyroid disease an unusual or allergic reaction to octreotide, other medicines, foods, dyes, or preservatives pregnant or trying to get pregnant breast-feeding How should I use this medication? This medicine is for injection under the skin or into a vein (only in emergency situations). It is usually given by a health care professional in a hospital or clinic setting. If you get this medicine at home, you will be taught how to prepare and give this medicine. Allow the injection solution to come to room temperature before use. Do not warm it artificially. Use exactly as directed. Take your medicine at regular intervals. Do not take your medicine more often than directed. It is important that you put your used needles and syringes in a special sharps container. Do not put them in a trash can. If you do not have a sharps container, call your pharmacist or healthcare provider to get one. Talk to your pediatrician regarding the use of this medicine in children. Special care may be needed. Overdosage: If you think you have taken too much of this medicine contact a poison control center or emergency room at once. NOTE: This medicine is only for you. Do not share this medicine with others. What if I miss a dose? If you miss a dose, take it as soon as you can. If it is almost time for your next dose, take only  that dose. Do not take double or extra doses. What may interact with this medication? bromocriptine certain medicines for blood pressure, heart disease, irregular heartbeat cyclosporine diuretics medicines for diabetes, including insulin quinidine This list may not describe all possible interactions. Give your health care provider a list of all the medicines, herbs, non-prescription drugs, or dietary supplements you use. Also tell them if you smoke, drink alcohol, or use illegal drugs. Some items may interact with your medicine. What should I watch for while using this medication? Visit your doctor or health care professional for regular checks on your progress. To help reduce irritation at the injection site, use a different site for each injection and make sure the solution is at room temperature before use. This medicine may cause decreases in blood sugar. Signs of low blood sugar include chills, cool, pale skin or cold sweats, drowsiness, extreme hunger, fast heartbeat, headache, nausea, nervousness or anxiety, shakiness, trembling, unsteadiness, tiredness, or weakness. Contact your doctor or health care professional right away if you experience any of these symptoms. This medicine may increase blood sugar. Ask your healthcare provider if changes in diet or medicines are needed if you have diabetes. This medicine may cause a decrease in vitamin B12. You should make sure that you get enough vitamin B12 while you are taking this medicine. Discuss the foods you eat and the vitamins you take with your health care professional. What side effects may I notice from receiving this medication? Side effects that you should report to your doctor or health care professional as soon as   possible: allergic reactions like skin rash, itching or hives, swelling of the face, lips, or tongue fast, slow, or irregular heartbeat right upper belly pain severe stomach pain signs and symptoms of high blood sugar such  as being more thirsty or hungry or having to urinate more than normal. You may also feel very tired or have blurry vision. signs and symptoms of low blood sugar such as feeling anxious; confusion; dizziness; increased hunger; unusually weak or tired; increased sweating; shakiness; cold, clammy skin; irritable; headache; blurred vision; fast heartbeat; loss of consciousness unusually weak or tired Side effects that usually do not require medical attention (report to your doctor or health care professional if they continue or are bothersome): diarrhea dizziness gas headache nausea, vomiting pain, redness, or irritation at site where injected upset stomach This list may not describe all possible side effects. Call your doctor for medical advice about side effects. You may report side effects to FDA at 1-800-FDA-1088. Where should I keep my medication? Keep out of the reach of children. Store in a refrigerator between 2 and 8 degrees C (36 and 46 degrees F). Protect from light. Allow to come to room temperature naturally. Do not use artificial heat. If protected from light, the injection may be stored at room temperature between 20 and 30 degrees C (70 and 86 degrees F) for 14 days. After the initial use, throw away any unused portion of a multiple dose vial after 14 days. Throw away unused portions of the ampules after use. NOTE: This sheet is a summary. It may not cover all possible information. If you have questions about this medicine, talk to your doctor, pharmacist, or health care provider.  2022 Elsevier/Gold Standard (2018-07-23 13:33:09)  

## 2020-10-17 ENCOUNTER — Encounter: Payer: Self-pay | Admitting: Oncology

## 2020-11-13 ENCOUNTER — Encounter: Payer: Self-pay | Admitting: *Deleted

## 2020-11-13 ENCOUNTER — Inpatient Hospital Stay: Payer: BC Managed Care – PPO | Attending: Oncology

## 2020-11-13 ENCOUNTER — Other Ambulatory Visit: Payer: Self-pay

## 2020-11-13 ENCOUNTER — Inpatient Hospital Stay: Payer: BC Managed Care – PPO

## 2020-11-13 VITALS — BP 109/73 | HR 66 | Resp 16

## 2020-11-13 DIAGNOSIS — C7B02 Secondary carcinoid tumors of liver: Secondary | ICD-10-CM | POA: Insufficient documentation

## 2020-11-13 DIAGNOSIS — Z79899 Other long term (current) drug therapy: Secondary | ICD-10-CM | POA: Diagnosis not present

## 2020-11-13 DIAGNOSIS — C7A019 Malignant carcinoid tumor of the small intestine, unspecified portion: Secondary | ICD-10-CM | POA: Diagnosis not present

## 2020-11-13 DIAGNOSIS — R978 Other abnormal tumor markers: Secondary | ICD-10-CM | POA: Insufficient documentation

## 2020-11-13 LAB — CMP (CANCER CENTER ONLY)
ALT: 11 U/L (ref 0–44)
AST: 19 U/L (ref 15–41)
Albumin: 4.5 g/dL (ref 3.5–5.0)
Alkaline Phosphatase: 63 U/L (ref 38–126)
Anion gap: 7 (ref 5–15)
BUN: 14 mg/dL (ref 6–20)
CO2: 33 mmol/L — ABNORMAL HIGH (ref 22–32)
Calcium: 9.6 mg/dL (ref 8.9–10.3)
Chloride: 103 mmol/L (ref 98–111)
Creatinine: 0.75 mg/dL (ref 0.44–1.00)
GFR, Estimated: >60 mL/min (ref >60)
Glucose, Bld: 84 mg/dL (ref 70–99)
Potassium: 4 mmol/L (ref 3.5–5.1)
Sodium: 143 mmol/L (ref 135–145)
Total Bilirubin: 0.7 mg/dL (ref 0.3–1.2)
Total Protein: 7.5 g/dL (ref 6.5–8.1)

## 2020-11-13 MED ORDER — OCTREOTIDE ACETATE 30 MG IM KIT
30.0000 mg | PACK | Freq: Once | INTRAMUSCULAR | Status: AC
  Administered 2020-11-13: 30 mg via INTRAMUSCULAR

## 2020-11-13 NOTE — Progress Notes (Signed)
Spoke w/Shaneka w/[employer] and corrected an error on PA form to administer SandoLAR in office. It has been approved. Copy of form to HIM to scan

## 2020-11-13 NOTE — Patient Instructions (Signed)
Octreotide injection solution What is this medication? OCTREOTIDE (ok TREE oh tide) is used to reduce blood levels of growth hormone in patients with a condition called acromegaly. This medicine also reduces flushing and watery diarrhea caused by certain types of cancer. This medicine may be used for other purposes; ask your health care provider or pharmacist if you have questions. COMMON BRAND NAME(S): Bynfezia, Sandostatin What should I tell my care team before I take this medication? They need to know if you have any of these conditions: diabetes gallbladder disease kidney disease liver disease thyroid disease an unusual or allergic reaction to octreotide, other medicines, foods, dyes, or preservatives pregnant or trying to get pregnant breast-feeding How should I use this medication? This medicine is for injection under the skin or into a vein (only in emergency situations). It is usually given by a health care professional in a hospital or clinic setting. If you get this medicine at home, you will be taught how to prepare and give this medicine. Allow the injection solution to come to room temperature before use. Do not warm it artificially. Use exactly as directed. Take your medicine at regular intervals. Do not take your medicine more often than directed. It is important that you put your used needles and syringes in a special sharps container. Do not put them in a trash can. If you do not have a sharps container, call your pharmacist or healthcare provider to get one. Talk to your pediatrician regarding the use of this medicine in children. Special care may be needed. Overdosage: If you think you have taken too much of this medicine contact a poison control center or emergency room at once. NOTE: This medicine is only for you. Do not share this medicine with others. What if I miss a dose? If you miss a dose, take it as soon as you can. If it is almost time for your next dose, take only  that dose. Do not take double or extra doses. What may interact with this medication? bromocriptine certain medicines for blood pressure, heart disease, irregular heartbeat cyclosporine diuretics medicines for diabetes, including insulin quinidine This list may not describe all possible interactions. Give your health care provider a list of all the medicines, herbs, non-prescription drugs, or dietary supplements you use. Also tell them if you smoke, drink alcohol, or use illegal drugs. Some items may interact with your medicine. What should I watch for while using this medication? Visit your doctor or health care professional for regular checks on your progress. To help reduce irritation at the injection site, use a different site for each injection and make sure the solution is at room temperature before use. This medicine may cause decreases in blood sugar. Signs of low blood sugar include chills, cool, pale skin or cold sweats, drowsiness, extreme hunger, fast heartbeat, headache, nausea, nervousness or anxiety, shakiness, trembling, unsteadiness, tiredness, or weakness. Contact your doctor or health care professional right away if you experience any of these symptoms. This medicine may increase blood sugar. Ask your healthcare provider if changes in diet or medicines are needed if you have diabetes. This medicine may cause a decrease in vitamin B12. You should make sure that you get enough vitamin B12 while you are taking this medicine. Discuss the foods you eat and the vitamins you take with your health care professional. What side effects may I notice from receiving this medication? Side effects that you should report to your doctor or health care professional as soon as   possible: allergic reactions like skin rash, itching or hives, swelling of the face, lips, or tongue fast, slow, or irregular heartbeat right upper belly pain severe stomach pain signs and symptoms of high blood sugar such  as being more thirsty or hungry or having to urinate more than normal. You may also feel very tired or have blurry vision. signs and symptoms of low blood sugar such as feeling anxious; confusion; dizziness; increased hunger; unusually weak or tired; increased sweating; shakiness; cold, clammy skin; irritable; headache; blurred vision; fast heartbeat; loss of consciousness unusually weak or tired Side effects that usually do not require medical attention (report to your doctor or health care professional if they continue or are bothersome): diarrhea dizziness gas headache nausea, vomiting pain, redness, or irritation at site where injected upset stomach This list may not describe all possible side effects. Call your doctor for medical advice about side effects. You may report side effects to FDA at 1-800-FDA-1088. Where should I keep my medication? Keep out of the reach of children. Store in a refrigerator between 2 and 8 degrees C (36 and 46 degrees F). Protect from light. Allow to come to room temperature naturally. Do not use artificial heat. If protected from light, the injection may be stored at room temperature between 20 and 30 degrees C (70 and 86 degrees F) for 14 days. After the initial use, throw away any unused portion of a multiple dose vial after 14 days. Throw away unused portions of the ampules after use. NOTE: This sheet is a summary. It may not cover all possible information. If you have questions about this medicine, talk to your doctor, pharmacist, or health care provider.  2022 Elsevier/Gold Standard (2018-07-23 00:00:00)  

## 2020-11-14 LAB — CHROMOGRANIN A: Chromogranin A (ng/mL): 71.9 ng/mL (ref 0.0–101.8)

## 2020-12-12 ENCOUNTER — Inpatient Hospital Stay: Payer: BC Managed Care – PPO | Attending: Oncology

## 2020-12-12 ENCOUNTER — Other Ambulatory Visit: Payer: Self-pay

## 2020-12-12 ENCOUNTER — Inpatient Hospital Stay (HOSPITAL_BASED_OUTPATIENT_CLINIC_OR_DEPARTMENT_OTHER): Payer: BC Managed Care – PPO | Admitting: Oncology

## 2020-12-12 VITALS — BP 106/74 | HR 60 | Temp 97.8°F | Resp 18 | Ht 65.0 in | Wt 129.6 lb

## 2020-12-12 DIAGNOSIS — Z79899 Other long term (current) drug therapy: Secondary | ICD-10-CM | POA: Insufficient documentation

## 2020-12-12 DIAGNOSIS — C786 Secondary malignant neoplasm of retroperitoneum and peritoneum: Secondary | ICD-10-CM | POA: Insufficient documentation

## 2020-12-12 DIAGNOSIS — C7B02 Secondary carcinoid tumors of liver: Secondary | ICD-10-CM | POA: Insufficient documentation

## 2020-12-12 DIAGNOSIS — D509 Iron deficiency anemia, unspecified: Secondary | ICD-10-CM | POA: Insufficient documentation

## 2020-12-12 DIAGNOSIS — C787 Secondary malignant neoplasm of liver and intrahepatic bile duct: Secondary | ICD-10-CM | POA: Diagnosis not present

## 2020-12-12 DIAGNOSIS — C7A019 Malignant carcinoid tumor of the small intestine, unspecified portion: Secondary | ICD-10-CM

## 2020-12-12 MED ORDER — OCTREOTIDE ACETATE 30 MG IM KIT
30.0000 mg | PACK | Freq: Once | INTRAMUSCULAR | Status: AC
  Administered 2020-12-12: 30 mg via INTRAMUSCULAR

## 2020-12-12 NOTE — Progress Notes (Signed)
[facility] OFFICE PROGRESS NOTE   Diagnosis: Carcinoid tumor  INTERVAL HISTORY:   Brittney [name] returns in scheduled.  She continues monthly Sandostatin.  No diarrhea or flushing.  She generally feels well.  She is exercising.  She has occasional "hemorrhoid "bleeding with bowel movements.  This is a chronic finding.  She underwent a colonoscopy in July 2020.  Objective:  Vital signs in last 24 hours:  Blood pressure 106/74, pulse 60, temperature 97.8 F (36.6 C), temperature source Oral, resp. rate 18, height 5\' 5"  (1.651 m), weight 129 lb 9.6 oz (58.8 kg), last menstrual period 01/31/2014, SpO2 100 %.    Resp: Lungs clear bilaterally Cardio: Regular rate and rhythm GI: Nontender, no mass, no hepatosplenomegaly, no apparent ascites Vascular: No leg edema   Lab Results:  Lab Results  Component Value Date   WBC 4.5 03/27/2020   HGB 13.6 03/27/2020   HCT 42.0 03/27/2020   MCV 94.0 03/27/2020   PLT 172 03/27/2020   NEUTROABS 2.1 03/27/2020    CMP  Lab Results  Component Value Date   NA 143 11/13/2020   K 4.0 11/13/2020   CL 103 11/13/2020   CO2 33 (H) 11/13/2020   GLUCOSE 84 11/13/2020   BUN 14 11/13/2020   CREATININE 0.75 11/13/2020   CALCIUM 9.6 11/13/2020   PROT 7.5 11/13/2020   ALBUMIN 4.5 11/13/2020   AST 19 11/13/2020   ALT 11 11/13/2020   ALKPHOS 63 11/13/2020   BILITOT 0.7 11/13/2020   GFRNONAA >60 11/13/2020   GFRAA >60 01/06/2019     Medications: I have reviewed the patient's current medications.   Assessment/Plan: Metastatic neuroendocrine tumor MRI of the abdomen 10/02/2016 confirmed multiple enhancing liver lesions consistent with metastases, no primary tumor site identified Ultrasound-guided biopsy of a left liver lesion 10/07/2016-neuroendocrine neoplasm, "intermediate grade ", review of pathology at GI tumor conference consistent with a low-grade carcinoid tumor Elevated chromogranin A 10/15/2016  gallium-DOTATATE scan  10/23/2016-multiple foci of liver metastases, enlarged central mesenteric lymph nodes, short intussusception's in adjacent small bowel felt to represent a primary small bowel tumor, additional mesenteric lymph nodes with metabolic activity, deep right pelvic nodule consistent with a pelvic metastasis Small bowel resection 11/21/2016- mid ileum mass, low-grade neuroendocrine tumor,pT4,pN2, 6/18 lymph nodes positive, positive mesenteric resection margin (lymph node), intramural satellite nodule CT 11/30/2016-small bowel obstruction with transition point adjacent to suture line at the distal small bowel, liver metastases similar to the 10/02/2016 MRI Recurrent obstructive symptoms requiring repeat surgery with resection of the small bowel anastomosis site 12/13/2016, pathology negative for malignancy Initiation of monthly Sandostatin 12/20/2016 CT abdomen/pelvis 02/19/2017-mild enlargement of liver lesions compared to November 2018 Cycle 1 Lutathera 06/17/2017 Cycle 2 Lutathera 08/13/2017 Cycle 3 Lutathera 10/07/2017 Cycle 4 Lutathera 12/02/2017 Dotatate scan 12/29/2017-no evidence of new or metastatic disease or progression.  Multiple hepatic metastasis.  Lesions have decreased in size from the most recent CT scan and slightly increased in radiotracer activity from dotatate PET scan 10/23/2016.  Interval resection of small bowel tumor and bulky mesenteric metastasis.  No residual activity in the bowel or mesentery.  Single small focus of activity in the deep right pelvis along the peritoneal surface not changed from comparison exam. Monthly Sandostatin continued Netspot 06/23/2018- mild decrease in radiotracer activity of all hepatic metastases, no new lesions, slight enlargement of several liver lesions Monthly Sandostatin continued Netspot 03/31/2019-no new liver lesions, no change in the size of liver lesions with a mild decrease in tracer activity involving several lesions,  stable right pelvic peritoneal  implant Monthly Sandostatin continued Netspot 12/07/2019-no new liver lesion, focus of activity left facet joint at C4-C5 has increased, intense activity associated with fractures of the left ninth and 10th ribs, unchanged size and number of liver lesions with increase in radiotracer activity-potentially indicating mild progression Netspot 08/14/2020-decrease in radiotracer activity associated with liver lesions, no new lesions, the liver lesions have not enlarged, decreased radiotracer activity associated with a small right pelvic peritoneal implant, decreased activity associated with degenerative changes in the cervical and thoracic spine, no evidence of progressive disease Monthly Sandostatin continued   2.  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  Intermittent abdominal bloating and left-sided abdominal pain- resolved   4.  Admission 11/30/2016 with a small bowel obstruction     Disposition: Brittney [name] appears stable.  She will continue monthly Sandostatin.  She will return for an office visit in 3 months.  The chromogranin a level was stable last month.  She will follow-up with Dr. Michail Sermon if she has consistent rectal bleeding.  Brittney Coder, MD  12/12/2020  12:31 PM

## 2020-12-12 NOTE — Patient Instructions (Signed)
[facility]   Discharge Instructions: Thank you for choosing [facility] to provide your oncology and hematology care.   If you have a lab appointment with the [facility], please go directly to the [facility] and check in at the registration area.   Wear comfortable clothing and clothing appropriate for easy access to any Portacath or PICC line.   We strive to give you quality time with your provider. You may need to reschedule your appointment if you arrive late (15 or more minutes).  Arriving late affects you and other patients whose appointments are after yours.  Also, if you miss three or more appointments without notifying the office, you may be dismissed from the clinic at the provider's discretion.      For prescription refill requests, have your pharmacy contact our office and allow 72 hours for refills to be completed.    Today you received the following Sandostatin   To help prevent nausea and vomiting after your treatment, we encourage you to take your nausea medication as directed.  BELOW ARE SYMPTOMS THAT SHOULD BE REPORTED IMMEDIATELY: *FEVER GREATER THAN 100.4 F (38 C) OR HIGHER *CHILLS OR SWEATING *NAUSEA AND VOMITING THAT IS NOT CONTROLLED WITH YOUR NAUSEA MEDICATION *UNUSUAL SHORTNESS OF BREATH *UNUSUAL BRUISING OR BLEEDING *URINARY PROBLEMS (pain or burning when urinating, or frequent urination) *BOWEL PROBLEMS (unusual diarrhea, constipation, pain near the anus) TENDERNESS IN MOUTH AND THROAT WITH OR WITHOUT PRESENCE OF ULCERS (sore throat, sores in mouth, or a toothache) UNUSUAL RASH, SWELLING OR PAIN  UNUSUAL VAGINAL DISCHARGE OR ITCHING   Items with * indicate a potential emergency and should be followed up as soon as possible or go to the Emergency Department if any problems should occur.  Please show the CHEMOTHERAPY ALERT CARD or IMMUNOTHERAPY ALERT CARD at check-in to the Emergency Department and triage  nurse.  Should you have questions after your visit or need to cancel or reschedule your appointment, please contact [facility]  Dept: [phone]  and follow the prompts.  Office hours are 8:00 a.m. to 4:30 p.m. Monday - Friday. Please note that voicemails left after 4:00 p.m. may not be returned until the following business day.  We are closed weekends and major holidays. You have access to a nurse at all times for urgent questions. Please call the main number to the clinic Dept: [phone] and follow the prompts.   For any non-urgent questions, you may also contact your provider using MyChart. We now offer e-Visits for anyone 34 and older to request care online for non-urgent symptoms. For details visit [email or web address].   Also download the MyChart app! Go to the app store, search "MyChart", open the app, select [facility], and log in with your MyChart username and password.  Due to Covid, a mask is required upon entering the hospital/clinic. If you do not have a mask, one will be given to you upon arrival. For doctor visits, patients may have 1 support person aged 33 or older with them. For treatment visits, patients cannot have anyone with them due to current Covid guidelines and our immunocompromised population.   Octreotide injection solution What is this medication? OCTREOTIDE (ok TREE oh tide) is used to reduce blood levels of growth hormone in patients with a condition called acromegaly. This medicine also reduces flushing and watery diarrhea caused by certain types of cancer. This medicine may be used for other purposes; ask your health care provider  or pharmacist if you have questions. COMMON BRAND NAME(S): Leatha Gilding, Sandostatin What should I tell my care team before I take this medication? They need to know if you have any of these conditions: diabetes gallbladder disease kidney disease liver disease thyroid disease an unusual or allergic  reaction to octreotide, other medicines, foods, dyes, or preservatives pregnant or trying to get pregnant breast-feeding How should I use this medication? This medicine is for injection under the skin or into a vein (only in emergency situations). It is usually given by a health care professional in a hospital or clinic setting. If you get this medicine at home, you will be taught how to prepare and give this medicine. Allow the injection solution to come to room temperature before use. Do not warm it artificially. Use exactly as directed. Take your medicine at regular intervals. Do not take your medicine more often than directed. It is important that you put your used needles and syringes in a special sharps container. Do not put them in a trash can. If you do not have a sharps container, call your pharmacist or healthcare provider to get one. Talk to your pediatrician regarding the use of this medicine in children. Special care may be needed. Overdosage: If you think you have taken too much of this medicine contact a poison control center or emergency room at once. NOTE: This medicine is only for you. Do not share this medicine with others. What if I miss a dose? If you miss a dose, take it as soon as you can. If it is almost time for your next dose, take only that dose. Do not take double or extra doses. What may interact with this medication? bromocriptine certain medicines for blood pressure, heart disease, irregular heartbeat cyclosporine diuretics medicines for diabetes, including insulin quinidine This list may not describe all possible interactions. Give your health care provider a list of all the medicines, herbs, non-prescription drugs, or dietary supplements you use. Also tell them if you smoke, drink alcohol, or use illegal drugs. Some items may interact with your medicine. What should I watch for while using this medication? Visit your doctor or health care professional for regular  checks on your progress. To help reduce irritation at the injection site, use a different site for each injection and make sure the solution is at room temperature before use. This medicine may cause decreases in blood sugar. Signs of low blood sugar include chills, cool, pale skin or cold sweats, drowsiness, extreme hunger, fast heartbeat, headache, nausea, nervousness or anxiety, shakiness, trembling, unsteadiness, tiredness, or weakness. Contact your doctor or health care professional right away if you experience any of these symptoms. This medicine may increase blood sugar. Ask your healthcare provider if changes in diet or medicines are needed if you have diabetes. This medicine may cause a decrease in vitamin B12. You should make sure that you get enough vitamin B12 while you are taking this medicine. Discuss the foods you eat and the vitamins you take with your health care professional. What side effects may I notice from receiving this medication? Side effects that you should report to your doctor or health care professional as soon as possible: allergic reactions like skin rash, itching or hives, swelling of the face, lips, or tongue fast, slow, or irregular heartbeat right upper belly pain severe stomach pain signs and symptoms of high blood sugar such as being more thirsty or hungry or having to urinate more than normal. You may also feel very  tired or have blurry vision. signs and symptoms of low blood sugar such as feeling anxious; confusion; dizziness; increased hunger; unusually weak or tired; increased sweating; shakiness; cold, clammy skin; irritable; headache; blurred vision; fast heartbeat; loss of consciousness unusually weak or tired Side effects that usually do not require medical attention (report to your doctor or health care professional if they continue or are bothersome): diarrhea dizziness gas headache nausea, vomiting pain, redness, or irritation at site where  injected upset stomach This list may not describe all possible side effects. Call your doctor for medical advice about side effects. You may report side effects to FDA at 1-800-FDA-1088. Where should I keep my medication? Keep out of the reach of children. Store in a refrigerator between 2 and 8 degrees C (36 and 46 degrees F). Protect from light. Allow to come to room temperature naturally. Do not use artificial heat. If protected from light, the injection may be stored at room temperature between 20 and 30 degrees C (70 and 86 degrees F) for 14 days. After the initial use, throw away any unused portion of a multiple dose vial after 14 days. Throw away unused portions of the ampules after use. NOTE: This sheet is a summary. It may not cover all possible information. If you have questions about this medicine, talk to your doctor, pharmacist, or health care provider.  2022 Elsevier/Gold Standard (2018-07-23 00:00:00)

## 2021-01-09 ENCOUNTER — Inpatient Hospital Stay: Payer: BC Managed Care – PPO | Attending: Oncology

## 2021-01-09 ENCOUNTER — Other Ambulatory Visit: Payer: Self-pay

## 2021-01-09 VITALS — BP 113/85 | HR 51 | Temp 97.8°F | Resp 18

## 2021-01-09 DIAGNOSIS — C7B02 Secondary carcinoid tumors of liver: Secondary | ICD-10-CM | POA: Insufficient documentation

## 2021-01-09 DIAGNOSIS — Z79899 Other long term (current) drug therapy: Secondary | ICD-10-CM | POA: Insufficient documentation

## 2021-01-09 DIAGNOSIS — C7A019 Malignant carcinoid tumor of the small intestine, unspecified portion: Secondary | ICD-10-CM

## 2021-01-09 MED ORDER — OCTREOTIDE ACETATE 30 MG IM KIT
30.0000 mg | PACK | Freq: Once | INTRAMUSCULAR | Status: AC
  Administered 2021-01-09: 30 mg via INTRAMUSCULAR
  Filled 2021-01-09: qty 1

## 2021-01-09 NOTE — Patient Instructions (Signed)
Octreotide injection solution What is this medication? OCTREOTIDE (ok TREE oh tide) is used to reduce blood levels of growth hormone in patients with a condition called acromegaly. This medicine also reduces flushing and watery diarrhea caused by certain types of cancer. This medicine may be used for other purposes; ask your health care provider or pharmacist if you have questions. COMMON BRAND NAME(S): Bynfezia, Sandostatin What should I tell my care team before I take this medication? They need to know if you have any of these conditions: diabetes gallbladder disease kidney disease liver disease thyroid disease an unusual or allergic reaction to octreotide, other medicines, foods, dyes, or preservatives pregnant or trying to get pregnant breast-feeding How should I use this medication? This medicine is for injection under the skin or into a vein (only in emergency situations). It is usually given by a health care professional in a hospital or clinic setting. If you get this medicine at home, you will be taught how to prepare and give this medicine. Allow the injection solution to come to room temperature before use. Do not warm it artificially. Use exactly as directed. Take your medicine at regular intervals. Do not take your medicine more often than directed. It is important that you put your used needles and syringes in a special sharps container. Do not put them in a trash can. If you do not have a sharps container, call your pharmacist or healthcare provider to get one. Talk to your pediatrician regarding the use of this medicine in children. Special care may be needed. Overdosage: If you think you have taken too much of this medicine contact a poison control center or emergency room at once. NOTE: This medicine is only for you. Do not share this medicine with others. What if I miss a dose? If you miss a dose, take it as soon as you can. If it is almost time for your next dose, take only  that dose. Do not take double or extra doses. What may interact with this medication? bromocriptine certain medicines for blood pressure, heart disease, irregular heartbeat cyclosporine diuretics medicines for diabetes, including insulin quinidine This list may not describe all possible interactions. Give your health care provider a list of all the medicines, herbs, non-prescription drugs, or dietary supplements you use. Also tell them if you smoke, drink alcohol, or use illegal drugs. Some items may interact with your medicine. What should I watch for while using this medication? Visit your doctor or health care professional for regular checks on your progress. To help reduce irritation at the injection site, use a different site for each injection and make sure the solution is at room temperature before use. This medicine may cause decreases in blood sugar. Signs of low blood sugar include chills, cool, pale skin or cold sweats, drowsiness, extreme hunger, fast heartbeat, headache, nausea, nervousness or anxiety, shakiness, trembling, unsteadiness, tiredness, or weakness. Contact your doctor or health care professional right away if you experience any of these symptoms. This medicine may increase blood sugar. Ask your healthcare provider if changes in diet or medicines are needed if you have diabetes. This medicine may cause a decrease in vitamin B12. You should make sure that you get enough vitamin B12 while you are taking this medicine. Discuss the foods you eat and the vitamins you take with your health care professional. What side effects may I notice from receiving this medication? Side effects that you should report to your doctor or health care professional as soon as   possible: allergic reactions like skin rash, itching or hives, swelling of the face, lips, or tongue fast, slow, or irregular heartbeat right upper belly pain severe stomach pain signs and symptoms of high blood sugar such  as being more thirsty or hungry or having to urinate more than normal. You may also feel very tired or have blurry vision. signs and symptoms of low blood sugar such as feeling anxious; confusion; dizziness; increased hunger; unusually weak or tired; increased sweating; shakiness; cold, clammy skin; irritable; headache; blurred vision; fast heartbeat; loss of consciousness unusually weak or tired Side effects that usually do not require medical attention (report to your doctor or health care professional if they continue or are bothersome): diarrhea dizziness gas headache nausea, vomiting pain, redness, or irritation at site where injected upset stomach This list may not describe all possible side effects. Call your doctor for medical advice about side effects. You may report side effects to FDA at 1-800-FDA-1088. Where should I keep my medication? Keep out of the reach of children. Store in a refrigerator between 2 and 8 degrees C (36 and 46 degrees F). Protect from light. Allow to come to room temperature naturally. Do not use artificial heat. If protected from light, the injection may be stored at room temperature between 20 and 30 degrees C (70 and 86 degrees F) for 14 days. After the initial use, throw away any unused portion of a multiple dose vial after 14 days. Throw away unused portions of the ampules after use. NOTE: This sheet is a summary. It may not cover all possible information. If you have questions about this medicine, talk to your doctor, pharmacist, or health care provider.  2022 Elsevier/Gold Standard (2018-07-23 00:00:00)  

## 2021-01-30 ENCOUNTER — Encounter: Payer: Self-pay | Admitting: Oncology

## 2021-02-05 ENCOUNTER — Encounter: Payer: Self-pay | Admitting: Oncology

## 2021-02-06 ENCOUNTER — Other Ambulatory Visit: Payer: Self-pay

## 2021-02-06 ENCOUNTER — Inpatient Hospital Stay: Payer: BC Managed Care – PPO

## 2021-02-06 VITALS — BP 105/65 | HR 66 | Temp 98.1°F | Resp 18

## 2021-02-06 DIAGNOSIS — C7A019 Malignant carcinoid tumor of the small intestine, unspecified portion: Secondary | ICD-10-CM | POA: Diagnosis not present

## 2021-02-06 MED ORDER — OCTREOTIDE ACETATE 30 MG IM KIT
30.0000 mg | PACK | Freq: Once | INTRAMUSCULAR | Status: AC
  Administered 2021-02-06: 30 mg via INTRAMUSCULAR

## 2021-02-06 NOTE — Patient Instructions (Signed)
Octreotide injection solution What is this medication? OCTREOTIDE (ok TREE oh tide) is used to reduce blood levels of growth hormone in patients with a condition called acromegaly. This medicine also reduces flushing and watery diarrhea caused by certain types of cancer. This medicine may be used for other purposes; ask your health care provider or pharmacist if you have questions. COMMON BRAND NAME(S): Bynfezia, Sandostatin What should I tell my care team before I take this medication? They need to know if you have any of these conditions: diabetes gallbladder disease kidney disease liver disease thyroid disease an unusual or allergic reaction to octreotide, other medicines, foods, dyes, or preservatives pregnant or trying to get pregnant breast-feeding How should I use this medication? This medicine is for injection under the skin or into a vein (only in emergency situations). It is usually given by a health care professional in a hospital or clinic setting. If you get this medicine at home, you will be taught how to prepare and give this medicine. Allow the injection solution to come to room temperature before use. Do not warm it artificially. Use exactly as directed. Take your medicine at regular intervals. Do not take your medicine more often than directed. It is important that you put your used needles and syringes in a special sharps container. Do not put them in a trash can. If you do not have a sharps container, call your pharmacist or healthcare provider to get one. Talk to your pediatrician regarding the use of this medicine in children. Special care may be needed. Overdosage: If you think you have taken too much of this medicine contact a poison control center or emergency room at once. NOTE: This medicine is only for you. Do not share this medicine with others. What if I miss a dose? If you miss a dose, take it as soon as you can. If it is almost time for your next dose, take only  that dose. Do not take double or extra doses. What may interact with this medication? bromocriptine certain medicines for blood pressure, heart disease, irregular heartbeat cyclosporine diuretics medicines for diabetes, including insulin quinidine This list may not describe all possible interactions. Give your health care provider a list of all the medicines, herbs, non-prescription drugs, or dietary supplements you use. Also tell them if you smoke, drink alcohol, or use illegal drugs. Some items may interact with your medicine. What should I watch for while using this medication? Visit your doctor or health care professional for regular checks on your progress. To help reduce irritation at the injection site, use a different site for each injection and make sure the solution is at room temperature before use. This medicine may cause decreases in blood sugar. Signs of low blood sugar include chills, cool, pale skin or cold sweats, drowsiness, extreme hunger, fast heartbeat, headache, nausea, nervousness or anxiety, shakiness, trembling, unsteadiness, tiredness, or weakness. Contact your doctor or health care professional right away if you experience any of these symptoms. This medicine may increase blood sugar. Ask your healthcare provider if changes in diet or medicines are needed if you have diabetes. This medicine may cause a decrease in vitamin B12. You should make sure that you get enough vitamin B12 while you are taking this medicine. Discuss the foods you eat and the vitamins you take with your health care professional. What side effects may I notice from receiving this medication? Side effects that you should report to your doctor or health care professional as soon as   possible: allergic reactions like skin rash, itching or hives, swelling of the face, lips, or tongue fast, slow, or irregular heartbeat right upper belly pain severe stomach pain signs and symptoms of high blood sugar such  as being more thirsty or hungry or having to urinate more than normal. You may also feel very tired or have blurry vision. signs and symptoms of low blood sugar such as feeling anxious; confusion; dizziness; increased hunger; unusually weak or tired; increased sweating; shakiness; cold, clammy skin; irritable; headache; blurred vision; fast heartbeat; loss of consciousness unusually weak or tired Side effects that usually do not require medical attention (report to your doctor or health care professional if they continue or are bothersome): diarrhea dizziness gas headache nausea, vomiting pain, redness, or irritation at site where injected upset stomach This list may not describe all possible side effects. Call your doctor for medical advice about side effects. You may report side effects to FDA at 1-800-FDA-1088. Where should I keep my medication? Keep out of the reach of children. Store in a refrigerator between 2 and 8 degrees C (36 and 46 degrees F). Protect from light. Allow to come to room temperature naturally. Do not use artificial heat. If protected from light, the injection may be stored at room temperature between 20 and 30 degrees C (70 and 86 degrees F) for 14 days. After the initial use, throw away any unused portion of a multiple dose vial after 14 days. Throw away unused portions of the ampules after use. NOTE: This sheet is a summary. It may not cover all possible information. If you have questions about this medicine, talk to your doctor, pharmacist, or health care provider.  2022 Elsevier/Gold Standard (2018-07-23 00:00:00)  

## 2021-03-02 ENCOUNTER — Inpatient Hospital Stay: Payer: BC Managed Care – PPO | Attending: Oncology

## 2021-03-02 ENCOUNTER — Other Ambulatory Visit: Payer: Self-pay

## 2021-03-02 ENCOUNTER — Inpatient Hospital Stay: Payer: BC Managed Care – PPO

## 2021-03-02 VITALS — BP 104/79 | HR 56 | Temp 97.8°F | Resp 16

## 2021-03-02 DIAGNOSIS — R978 Other abnormal tumor markers: Secondary | ICD-10-CM | POA: Diagnosis not present

## 2021-03-02 DIAGNOSIS — C7B02 Secondary carcinoid tumors of liver: Secondary | ICD-10-CM | POA: Diagnosis present

## 2021-03-02 DIAGNOSIS — Z79899 Other long term (current) drug therapy: Secondary | ICD-10-CM | POA: Diagnosis not present

## 2021-03-02 DIAGNOSIS — C7A019 Malignant carcinoid tumor of the small intestine, unspecified portion: Secondary | ICD-10-CM

## 2021-03-02 LAB — CMP (CANCER CENTER ONLY)
ALT: 12 U/L (ref 0–44)
AST: 20 U/L (ref 15–41)
Albumin: 4.3 g/dL (ref 3.5–5.0)
Alkaline Phosphatase: 69 U/L (ref 38–126)
Anion gap: 7 (ref 5–15)
BUN: 11 mg/dL (ref 6–20)
CO2: 31 mmol/L (ref 22–32)
Calcium: 9.4 mg/dL (ref 8.9–10.3)
Chloride: 100 mmol/L (ref 98–111)
Creatinine: 0.68 mg/dL (ref 0.44–1.00)
GFR, Estimated: >60 mL/min (ref >60)
Glucose, Bld: 93 mg/dL (ref 70–99)
Potassium: 4.2 mmol/L (ref 3.5–5.1)
Sodium: 138 mmol/L (ref 135–145)
Total Bilirubin: 0.6 mg/dL (ref 0.3–1.2)
Total Protein: 6.8 g/dL (ref 6.5–8.1)

## 2021-03-02 LAB — CBC WITH DIFFERENTIAL (CANCER CENTER ONLY)
Abs Immature Granulocytes: 0.01 10*3/uL (ref 0.00–0.07)
Basophils Absolute: 0 10*3/uL (ref 0.0–0.1)
Basophils Relative: 1 %
Eosinophils Absolute: 0.1 10*3/uL (ref 0.0–0.5)
Eosinophils Relative: 2 %
HCT: 42.2 % (ref 36.0–46.0)
Hemoglobin: 13.7 g/dL (ref 12.0–15.0)
Immature Granulocytes: 0 %
Lymphocytes Relative: 24 %
Lymphs Abs: 1.9 10*3/uL (ref 0.7–4.0)
MCH: 29.6 pg (ref 26.0–34.0)
MCHC: 32.5 g/dL (ref 30.0–36.0)
MCV: 91.1 fL (ref 80.0–100.0)
Monocytes Absolute: 0.5 10*3/uL (ref 0.1–1.0)
Monocytes Relative: 7 %
Neutro Abs: 5.2 10*3/uL (ref 1.7–7.7)
Neutrophils Relative %: 66 %
Platelet Count: 195 10*3/uL (ref 150–400)
RBC: 4.63 MIL/uL (ref 3.87–5.11)
RDW: 12.6 % (ref 11.5–15.5)
WBC Count: 7.8 10*3/uL (ref 4.0–10.5)
nRBC: 0 % (ref 0.0–0.2)

## 2021-03-02 MED ORDER — OCTREOTIDE ACETATE 30 MG IM KIT
30.0000 mg | PACK | Freq: Once | INTRAMUSCULAR | Status: AC
  Administered 2021-03-02: 30 mg via INTRAMUSCULAR

## 2021-03-02 NOTE — Patient Instructions (Signed)
Octreotide injection solution What is this medication? OCTREOTIDE (ok TREE oh tide) is used to reduce blood levels of growth hormone in patients with a condition called acromegaly. This medicine also reduces flushing and watery diarrhea caused by certain types of cancer. This medicine may be used for other purposes; ask your health care provider or pharmacist if you have questions. COMMON BRAND NAME(S): Bynfezia, Sandostatin What should I tell my care team before I take this medication? They need to know if you have any of these conditions: diabetes gallbladder disease kidney disease liver disease thyroid disease an unusual or allergic reaction to octreotide, other medicines, foods, dyes, or preservatives pregnant or trying to get pregnant breast-feeding How should I use this medication? This medicine is for injection under the skin or into a vein (only in emergency situations). It is usually given by a health care professional in a hospital or clinic setting. If you get this medicine at home, you will be taught how to prepare and give this medicine. Allow the injection solution to come to room temperature before use. Do not warm it artificially. Use exactly as directed. Take your medicine at regular intervals. Do not take your medicine more often than directed. It is important that you put your used needles and syringes in a special sharps container. Do not put them in a trash can. If you do not have a sharps container, call your pharmacist or healthcare provider to get one. Talk to your pediatrician regarding the use of this medicine in children. Special care may be needed. Overdosage: If you think you have taken too much of this medicine contact a poison control center or emergency room at once. NOTE: This medicine is only for you. Do not share this medicine with others. What if I miss a dose? If you miss a dose, take it as soon as you can. If it is almost time for your next dose, take only  that dose. Do not take double or extra doses. What may interact with this medication? bromocriptine certain medicines for blood pressure, heart disease, irregular heartbeat cyclosporine diuretics medicines for diabetes, including insulin quinidine This list may not describe all possible interactions. Give your health care provider a list of all the medicines, herbs, non-prescription drugs, or dietary supplements you use. Also tell them if you smoke, drink alcohol, or use illegal drugs. Some items may interact with your medicine. What should I watch for while using this medication? Visit your doctor or health care professional for regular checks on your progress. To help reduce irritation at the injection site, use a different site for each injection and make sure the solution is at room temperature before use. This medicine may cause decreases in blood sugar. Signs of low blood sugar include chills, cool, pale skin or cold sweats, drowsiness, extreme hunger, fast heartbeat, headache, nausea, nervousness or anxiety, shakiness, trembling, unsteadiness, tiredness, or weakness. Contact your doctor or health care professional right away if you experience any of these symptoms. This medicine may increase blood sugar. Ask your healthcare provider if changes in diet or medicines are needed if you have diabetes. This medicine may cause a decrease in vitamin B12. You should make sure that you get enough vitamin B12 while you are taking this medicine. Discuss the foods you eat and the vitamins you take with your health care professional. What side effects may I notice from receiving this medication? Side effects that you should report to your doctor or health care professional as soon as   possible: allergic reactions like skin rash, itching or hives, swelling of the face, lips, or tongue fast, slow, or irregular heartbeat right upper belly pain severe stomach pain signs and symptoms of high blood sugar such  as being more thirsty or hungry or having to urinate more than normal. You may also feel very tired or have blurry vision. signs and symptoms of low blood sugar such as feeling anxious; confusion; dizziness; increased hunger; unusually weak or tired; increased sweating; shakiness; cold, clammy skin; irritable; headache; blurred vision; fast heartbeat; loss of consciousness unusually weak or tired Side effects that usually do not require medical attention (report to your doctor or health care professional if they continue or are bothersome): diarrhea dizziness gas headache nausea, vomiting pain, redness, or irritation at site where injected upset stomach This list may not describe all possible side effects. Call your doctor for medical advice about side effects. You may report side effects to FDA at 1-800-FDA-1088. Where should I keep my medication? Keep out of the reach of children. Store in a refrigerator between 2 and 8 degrees C (36 and 46 degrees F). Protect from light. Allow to come to room temperature naturally. Do not use artificial heat. If protected from light, the injection may be stored at room temperature between 20 and 30 degrees C (70 and 86 degrees F) for 14 days. After the initial use, throw away any unused portion of a multiple dose vial after 14 days. Throw away unused portions of the ampules after use. NOTE: This sheet is a summary. It may not cover all possible information. If you have questions about this medicine, talk to your doctor, pharmacist, or health care provider.  2022 Elsevier/Gold Standard (2018-07-23 00:00:00)  

## 2021-03-06 ENCOUNTER — Inpatient Hospital Stay: Payer: BC Managed Care – PPO

## 2021-03-06 LAB — CHROMOGRANIN A: Chromogranin A (ng/mL): 69.5 ng/mL (ref 0.0–101.8)

## 2021-04-03 ENCOUNTER — Inpatient Hospital Stay (HOSPITAL_BASED_OUTPATIENT_CLINIC_OR_DEPARTMENT_OTHER): Payer: BC Managed Care – PPO | Admitting: Oncology

## 2021-04-03 ENCOUNTER — Other Ambulatory Visit: Payer: Self-pay

## 2021-04-03 ENCOUNTER — Inpatient Hospital Stay: Payer: BC Managed Care – PPO | Attending: Oncology

## 2021-04-03 VITALS — BP 104/74 | HR 64 | Temp 97.8°F | Resp 18 | Ht 65.0 in | Wt 133.4 lb

## 2021-04-03 DIAGNOSIS — C7B02 Secondary carcinoid tumors of liver: Secondary | ICD-10-CM | POA: Insufficient documentation

## 2021-04-03 DIAGNOSIS — Z79899 Other long term (current) drug therapy: Secondary | ICD-10-CM | POA: Insufficient documentation

## 2021-04-03 DIAGNOSIS — D509 Iron deficiency anemia, unspecified: Secondary | ICD-10-CM | POA: Insufficient documentation

## 2021-04-03 DIAGNOSIS — C7A019 Malignant carcinoid tumor of the small intestine, unspecified portion: Secondary | ICD-10-CM | POA: Diagnosis not present

## 2021-04-03 MED ORDER — OCTREOTIDE ACETATE 30 MG IM KIT
30.0000 mg | PACK | Freq: Once | INTRAMUSCULAR | Status: AC
  Administered 2021-04-03: 30 mg via INTRAMUSCULAR
  Filled 2021-04-03: qty 1

## 2021-04-03 NOTE — Patient Instructions (Signed)
Octreotide injection solution What is this medication? OCTREOTIDE (ok TREE oh tide) is used to reduce blood levels of growth hormone in patients with a condition called acromegaly. This medicine also reduces flushing and watery diarrhea caused by certain types of cancer. This medicine may be used for other purposes; ask your health care provider or pharmacist if you have questions. COMMON BRAND NAME(S): Bynfezia, Sandostatin What should I tell my care team before I take this medication? They need to know if you have any of these conditions: diabetes gallbladder disease kidney disease liver disease thyroid disease an unusual or allergic reaction to octreotide, other medicines, foods, dyes, or preservatives pregnant or trying to get pregnant breast-feeding How should I use this medication? This medicine is for injection under the skin or into a vein (only in emergency situations). It is usually given by a health care professional in a hospital or clinic setting. If you get this medicine at home, you will be taught how to prepare and give this medicine. Allow the injection solution to come to room temperature before use. Do not warm it artificially. Use exactly as directed. Take your medicine at regular intervals. Do not take your medicine more often than directed. It is important that you put your used needles and syringes in a special sharps container. Do not put them in a trash can. If you do not have a sharps container, call your pharmacist or healthcare provider to get one. Talk to your pediatrician regarding the use of this medicine in children. Special care may be needed. Overdosage: If you think you have taken too much of this medicine contact a poison control center or emergency room at once. NOTE: This medicine is only for you. Do not share this medicine with others. What if I miss a dose? If you miss a dose, take it as soon as you can. If it is almost time for your next dose, take only  that dose. Do not take double or extra doses. What may interact with this medication? bromocriptine certain medicines for blood pressure, heart disease, irregular heartbeat cyclosporine diuretics medicines for diabetes, including insulin quinidine This list may not describe all possible interactions. Give your health care provider a list of all the medicines, herbs, non-prescription drugs, or dietary supplements you use. Also tell them if you smoke, drink alcohol, or use illegal drugs. Some items may interact with your medicine. What should I watch for while using this medication? Visit your doctor or health care professional for regular checks on your progress. To help reduce irritation at the injection site, use a different site for each injection and make sure the solution is at room temperature before use. This medicine may cause decreases in blood sugar. Signs of low blood sugar include chills, cool, pale skin or cold sweats, drowsiness, extreme hunger, fast heartbeat, headache, nausea, nervousness or anxiety, shakiness, trembling, unsteadiness, tiredness, or weakness. Contact your doctor or health care professional right away if you experience any of these symptoms. This medicine may increase blood sugar. Ask your healthcare provider if changes in diet or medicines are needed if you have diabetes. This medicine may cause a decrease in vitamin B12. You should make sure that you get enough vitamin B12 while you are taking this medicine. Discuss the foods you eat and the vitamins you take with your health care professional. What side effects may I notice from receiving this medication? Side effects that you should report to your doctor or health care professional as soon as   possible: allergic reactions like skin rash, itching or hives, swelling of the face, lips, or tongue fast, slow, or irregular heartbeat right upper belly pain severe stomach pain signs and symptoms of high blood sugar such  as being more thirsty or hungry or having to urinate more than normal. You may also feel very tired or have blurry vision. signs and symptoms of low blood sugar such as feeling anxious; confusion; dizziness; increased hunger; unusually weak or tired; increased sweating; shakiness; cold, clammy skin; irritable; headache; blurred vision; fast heartbeat; loss of consciousness unusually weak or tired Side effects that usually do not require medical attention (report to your doctor or health care professional if they continue or are bothersome): diarrhea dizziness gas headache nausea, vomiting pain, redness, or irritation at site where injected upset stomach This list may not describe all possible side effects. Call your doctor for medical advice about side effects. You may report side effects to FDA at 1-800-FDA-1088. Where should I keep my medication? Keep out of the reach of children. Store in a refrigerator between 2 and 8 degrees C (36 and 46 degrees F). Protect from light. Allow to come to room temperature naturally. Do not use artificial heat. If protected from light, the injection may be stored at room temperature between 20 and 30 degrees C (70 and 86 degrees F) for 14 days. After the initial use, throw away any unused portion of a multiple dose vial after 14 days. Throw away unused portions of the ampules after use. NOTE: This sheet is a summary. It may not cover all possible information. If you have questions about this medicine, talk to your doctor, pharmacist, or health care provider.  2022 Elsevier/Gold Standard (2018-07-23 00:00:00)  

## 2021-04-03 NOTE — Progress Notes (Signed)
?[facility] ?OFFICE PROGRESS NOTE ? ? ?Diagnosis: Carcinoid tumor ? ?INTERVAL HISTORY:  ? ?Ms. Friscia returns as scheduled.  She continues monthly Sandostatin.  No flushing or diarrhea.  She feels well.  Good energy level.  She is running.  No abdominal pain.  No bleeding. ? ?Objective: ? ?Vital signs in last 24 hours: ? ?Blood pressure 104/74, pulse 64, temperature 97.8 ?F (36.6 ?C), temperature source Oral, resp. rate 18, height '5\' 5"'$  (1.651 m), weight 133 lb 6.4 oz (60.5 kg), last menstrual period 01/31/2014, SpO2 99 %. ?  ? ?Resp: Lungs clear bilaterally ?Cardio: Regular rate and rhythm, no murmur ?GI: No hepatosplenomegaly, no mass, nontender ?Vascular: No leg edema ? ?Lab Results: ? ?Lab Results  ?Component Value Date  ? WBC 7.8 03/02/2021  ? HGB 13.7 03/02/2021  ? HCT 42.2 03/02/2021  ? MCV 91.1 03/02/2021  ? PLT 195 03/02/2021  ? NEUTROABS 5.2 03/02/2021  ? ? ?CMP  ?Lab Results  ?Component Value Date  ? NA 138 03/02/2021  ? K 4.2 03/02/2021  ? CL 100 03/02/2021  ? CO2 31 03/02/2021  ? GLUCOSE 93 03/02/2021  ? BUN 11 03/02/2021  ? CREATININE 0.68 03/02/2021  ? CALCIUM 9.4 03/02/2021  ? PROT 6.8 03/02/2021  ? ALBUMIN 4.3 03/02/2021  ? AST 20 03/02/2021  ? ALT 12 03/02/2021  ? ALKPHOS 69 03/02/2021  ? BILITOT 0.6 03/02/2021  ? GFRNONAA >60 03/02/2021  ? GFRAA >60 01/06/2019  ? ? ?Medications: I have reviewed the patient's current medications. ? ? ?Assessment/Plan: ?Metastatic neuroendocrine tumor ?MRI of the abdomen 10/02/2016 confirmed multiple enhancing liver lesions consistent with metastases, no primary tumor site identified ?Ultrasound-guided biopsy of a left liver lesion 10/07/2016-neuroendocrine neoplasm, "intermediate grade ", review of pathology at GI tumor conference consistent with a low-grade carcinoid tumor ?Elevated chromogranin A 10/15/2016 ? gallium-DOTATATE scan 10/23/2016-multiple foci of liver metastases, enlarged central mesenteric lymph nodes, short intussusception's in  adjacent small bowel felt to represent a primary small bowel tumor, additional mesenteric lymph nodes with metabolic activity, deep right pelvic nodule consistent with a pelvic metastasis ?Small bowel resection 11/21/2016- mid ileum mass, low-grade neuroendocrine tumor,pT4,pN2, 6/18 lymph nodes positive, positive mesenteric resection margin (lymph node), intramural satellite nodule ?CT 11/30/2016-small bowel obstruction with transition point adjacent to suture line at the distal small bowel, liver metastases similar to the 10/02/2016 MRI ?Recurrent obstructive symptoms requiring repeat surgery with resection of the small bowel anastomosis site 12/13/2016, pathology negative for malignancy ?Initiation of monthly Sandostatin 12/20/2016 ?CT abdomen/pelvis 02/19/2017-mild enlargement of liver lesions compared to November 2018 ?Cycle 1 Lutathera 06/17/2017 ?Cycle 2 Lutathera 08/13/2017 ?Cycle 3 Lutathera 10/07/2017 ?Cycle 4 Lutathera 12/02/2017 ?Dotatate scan 12/29/2017-no evidence of new or metastatic disease or progression.  Multiple hepatic metastasis.  Lesions have decreased in size from the most recent CT scan and slightly increased in radiotracer activity from dotatate PET scan 10/23/2016.  Interval resection of small bowel tumor and bulky mesenteric metastasis.  No residual activity in the bowel or mesentery.  Single small focus of activity in the deep right pelvis along the peritoneal surface not changed from comparison exam. ?Monthly Sandostatin continued ?Netspot 06/23/2018- mild decrease in radiotracer activity of all hepatic metastases, no new lesions, slight enlargement of several liver lesions ?Monthly Sandostatin continued ?Netspot 03/31/2019-no new liver lesions, no change in the size of liver lesions with a mild decrease in tracer activity involving several lesions, stable right pelvic peritoneal implant ?Monthly Sandostatin continued ?Netspot 12/07/2019-no new liver lesion, focus of activity left  facet joint at  C4-C5 has increased, intense activity associated with fractures of the left ninth and 10th ribs, unchanged size and number of liver lesions with increase in radiotracer activity-potentially indicating mild progression ?Netspot 08/14/2020-decrease in radiotracer activity associated with liver lesions, no new lesions, the liver lesions have not enlarged, decreased radiotracer activity associated with a small right pelvic peritoneal implant, decreased activity associated with degenerative changes in the cervical and thoracic spine, no evidence of progressive disease ?Monthly Sandostatin continued ?  ?2.  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  ?3.  Intermittent abdominal bloating and left-sided abdominal pain- resolved ?  ?4.  Admission 11/30/2016 with a small bowel obstruction ? ? ? ? ?Disposition: ?Ms. Whitehead appears stable.  There is no clinical evidence for progression of the metastatic carcinoid tumor.  She will continue monthly Sandostatin.  She will be scheduled for a restaging dotatate PET in May.  She will return for an office visit in 4 months. ? ?[name], MD ? ?04/03/2021  ?11:11 AM ? ? ?

## 2021-04-03 NOTE — Progress Notes (Deleted)
Metastatic neuroendocrine tumor ?MRI of the abdomen 10/02/2016 confirmed multiple enhancing liver lesions consistent with metastases, no primary tumor site identified ?Ultrasound-guided biopsy of a left liver lesion 10/07/2016-neuroendocrine neoplasm, "intermediate grade ", review of pathology at GI tumor conference consistent with a low-grade carcinoid tumor ?Elevated chromogranin A 10/15/2016 ? gallium-DOTATATE scan 10/23/2016-multiple foci of liver metastases, enlarged central mesenteric lymph nodes, short intussusception's in adjacent small bowel felt to represent a primary small bowel tumor, additional mesenteric lymph nodes with metabolic activity, deep right pelvic nodule consistent with a pelvic metastasis ?Small bowel resection 11/21/2016- mid ileum mass, low-grade neuroendocrine tumor,pT4,pN2, 6/18 lymph nodes positive, positive mesenteric resection margin (lymph node), intramural satellite nodule ?CT 11/30/2016-small bowel obstruction with transition point adjacent to suture line at the distal small bowel, liver metastases similar to the 10/02/2016 MRI ?Recurrent obstructive symptoms requiring repeat surgery with resection of the small bowel anastomosis site 12/13/2016, pathology negative for malignancy ?Initiation of monthly Sandostatin 12/20/2016 ?CT abdomen/pelvis 02/19/2017-mild enlargement of liver lesions compared to November 2018 ?Cycle 1 Lutathera 06/17/2017 ?Cycle 2 Lutathera 08/13/2017 ?Cycle 3 Lutathera 10/07/2017 ?Cycle 4 Lutathera 12/02/2017 ?Dotatate scan 12/29/2017-no evidence of new or metastatic disease or progression.  Multiple hepatic metastasis.  Lesions have decreased in size from the most recent CT scan and slightly increased in radiotracer activity from dotatate PET scan 10/23/2016.  Interval resection of small bowel tumor and bulky mesenteric metastasis.  No residual activity in the bowel or mesentery.  Single small focus of activity in the deep right pelvis along the peritoneal surface  not changed from comparison exam. ?Monthly Sandostatin continued ?Netspot 06/23/2018- mild decrease in radiotracer activity of all hepatic metastases, no new lesions, slight enlargement of several liver lesions ?Monthly Sandostatin continued ?Netspot 03/31/2019-no new liver lesions, no change in the size of liver lesions with a mild decrease in tracer activity involving several lesions, stable right pelvic peritoneal implant ?Monthly Sandostatin continued ?Netspot 12/07/2019-no new liver lesion, focus of activity left facet joint at C4-C5 has increased, intense activity associated with fractures of the left ninth and 10th ribs, unchanged size and number of liver lesions with increase in radiotracer activity-potentially indicating mild progression ?Netspot 08/14/2020-decrease in radiotracer activity associated with liver lesions, no new lesions, the liver lesions have not enlarged, decreased radiotracer activity associated with a small right pelvic peritoneal implant, decreased activity associated with degenerative changes in the cervical and thoracic spine, no evidence of progressive disease ?Monthly Sandostatin continued ?  ?2.  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  ?3.  Intermittent abdominal bloating and left-sided abdominal pain- resolved ?  ?4.  Admission 11/30/2016 with a small bowel obstruction ? ? ?

## 2021-04-08 ENCOUNTER — Encounter: Payer: Self-pay | Admitting: Oncology

## 2021-05-04 ENCOUNTER — Inpatient Hospital Stay: Payer: BC Managed Care – PPO | Attending: Oncology

## 2021-05-04 VITALS — BP 102/62 | HR 57 | Temp 98.0°F | Resp 18

## 2021-05-04 DIAGNOSIS — Z79899 Other long term (current) drug therapy: Secondary | ICD-10-CM | POA: Diagnosis not present

## 2021-05-04 DIAGNOSIS — C7B02 Secondary carcinoid tumors of liver: Secondary | ICD-10-CM | POA: Diagnosis present

## 2021-05-04 DIAGNOSIS — C7A019 Malignant carcinoid tumor of the small intestine, unspecified portion: Secondary | ICD-10-CM | POA: Diagnosis present

## 2021-05-04 MED ORDER — OCTREOTIDE ACETATE 30 MG IM KIT
30.0000 mg | PACK | Freq: Once | INTRAMUSCULAR | Status: AC
  Administered 2021-05-04: 30 mg via INTRAMUSCULAR

## 2021-05-04 NOTE — Patient Instructions (Signed)
Octreotide injection solution ?What is this medication? ?OCTREOTIDE (ok TREE oh tide) is used to reduce blood levels of growth hormone in patients with a condition called acromegaly. This medicine also reduces flushing and watery diarrhea caused by certain types of cancer. ?This medicine may be used for other purposes; ask your health care provider or pharmacist if you have questions. ?COMMON BRAND NAME(S): Bynfezia, Sandostatin ?What should I tell my care team before I take this medication? ?They need to know if you have any of these conditions: ?diabetes ?gallbladder disease ?kidney disease ?liver disease ?thyroid disease ?an unusual or allergic reaction to octreotide, other medicines, foods, dyes, or preservatives ?pregnant or trying to get pregnant ?breast-feeding ?How should I use this medication? ?This medication is injected under the skin or into a vein. It is usually given by your care team in a hospital or clinic setting. ?If you get this medication at home, you will be taught how to prepare and give it. Use exactly as directed. Take it as directed on the prescription label at the same time every day. Keep taking it unless your care team tells you to stop. ?Allow the injection solution to come to room temperature before use. Do not warm it artificially. ?It is important that you put your used needles and syringes in a special sharps container. Do not put them in a trash can. If you do not have a sharps container, call your pharmacist or care team to get one. ?Talk to your care team about the use of this medication in children. Special care may be needed. ?Overdosage: If you think you have taken too much of this medicine contact a poison control center or emergency room at once. ?NOTE: This medicine is only for you. Do not share this medicine with others. ?What if I miss a dose? ?If you miss a dose, take it as soon as you can. If it is almost time for your next dose, take only that dose. Do not take double  or extra doses. ?What may interact with this medication? ?bromocriptine ?certain medicines for blood pressure, heart disease, irregular heartbeat ?cyclosporine ?diuretics ?medicines for diabetes, including insulin ?quinidine ?This list may not describe all possible interactions. Give your health care provider a list of all the medicines, herbs, non-prescription drugs, or dietary supplements you use. Also tell them if you smoke, drink alcohol, or use illegal drugs. Some items may interact with your medicine. ?What should I watch for while using this medication? ?Visit your care team for regular checks on your progress. Tell your care team if your symptoms do not start to get better or if they get worse. ?To help reduce irritation at the injection site, use a different site for each injection and make sure the solution is at room temperature before use. ?This medication may cause decreases in blood sugar. Signs of low blood sugar include chills, cool, pale skin or cold sweats, drowsiness, extreme hunger, fast heartbeat, headache, nausea, nervousness or anxiety, shakiness, trembling, unsteadiness, tiredness, or weakness. Contact your care team right away if you experience any of these symptoms. ?This medication may increase blood sugar. The risk may be higher in patients who already have diabetes. Ask your care team what you can do to lower your risk of diabetes while taking this medication. ?You should make sure you get enough vitamin B12 while you are taking this medication. Discuss the foods you eat and the vitamins you take with your care team. ?What side effects may I notice from receiving   this medication? ?Side effects that you should report to your doctor or health care professional as soon as possible: ?allergic reactions like skin rash, itching or hives, swelling of the face, lips, or tongue ?fast, slow, or irregular heartbeat ?right upper belly pain ?severe stomach pain ?signs and symptoms of high blood sugar  such as being more thirsty or hungry or having to urinate more than normal. You may also feel very tired or have blurry vision. ?signs and symptoms of low blood sugar such as feeling anxious; confusion; dizziness; increased hunger; unusually weak or tired; increased sweating; shakiness; cold, clammy skin; irritable; headache; blurred vision; fast heartbeat; loss of consciousness ?unusually weak or tired ?Side effects that usually do not require medical attention (report to your doctor or health care professional if they continue or are bothersome): ?diarrhea ?dizziness ?gas ?headache ?nausea, vomiting ?pain, redness, or irritation at site where injected ?upset stomach ?This list may not describe all possible side effects. Call your doctor for medical advice about side effects. You may report side effects to FDA at 1-800-FDA-1088. ?Where should I keep my medication? ?Keep out of the reach of children and pets. ?Store in the refrigerator. Protect from light. Allow to come to room temperature naturally. Do not use artificial heat. If protected from light, the injection may be stored between 20 and 30 degrees C (70 and 86 degrees F) for 14 days. After the initial use, throw away any unused portion of a multiple dose vial after 14 days. Get rid of any unused portions of the ampules after use. ?To get rid of medications that are no longer needed or have expired: ?Take the medication to a medication take-back program. Ask your pharmacy or law enforcement to find a location. ?If you cannot return the medication, ask your pharmacist or care team how to get rid of the medication safely. ?NOTE: This sheet is a summary. It may not cover all possible information. If you have questions about this medicine, talk to your doctor, pharmacist, or health care provider. ?? 2023 Elsevier/Gold Standard (2020-12-15 00:00:00) ? ?

## 2021-05-14 ENCOUNTER — Encounter: Payer: Self-pay | Admitting: Oncology

## 2021-05-28 ENCOUNTER — Encounter (HOSPITAL_COMMUNITY)
Admission: RE | Admit: 2021-05-28 | Discharge: 2021-05-28 | Disposition: A | Discharge status: Home / Self Care | Payer: BC Managed Care – PPO | Source: Ambulatory Visit | Attending: Oncology | Admitting: Oncology

## 2021-05-28 ENCOUNTER — Other Ambulatory Visit: Payer: Self-pay | Admitting: Oncology

## 2021-05-28 ENCOUNTER — Inpatient Hospital Stay: Payer: BC Managed Care – PPO | Attending: Oncology

## 2021-05-28 VITALS — BP 104/85 | HR 62 | Temp 98.2°F | Resp 18 | Wt 134.0 lb

## 2021-05-28 DIAGNOSIS — C7B02 Secondary carcinoid tumors of liver: Secondary | ICD-10-CM | POA: Insufficient documentation

## 2021-05-28 DIAGNOSIS — C7A019 Malignant carcinoid tumor of the small intestine, unspecified portion: Secondary | ICD-10-CM | POA: Insufficient documentation

## 2021-05-28 MED ORDER — GALLIUM GA 68 DOTATATE IV KIT
3.5000 | PACK | Freq: Once | INTRAVENOUS | Status: AC | PRN
  Administered 2021-05-28: 3.5 via INTRAVENOUS

## 2021-05-28 MED ORDER — OCTREOTIDE ACETATE 30 MG IM KIT
30.0000 mg | PACK | Freq: Once | INTRAMUSCULAR | Status: AC
  Administered 2021-05-28: 30 mg via INTRAMUSCULAR
  Filled 2021-05-28: qty 1

## 2021-05-28 NOTE — Patient Instructions (Signed)
Octreotide injection solution ?What is this medication? ?OCTREOTIDE (ok TREE oh tide) is used to reduce blood levels of growth hormone in patients with a condition called acromegaly. This medicine also reduces flushing and watery diarrhea caused by certain types of cancer. ?This medicine may be used for other purposes; ask your health care provider or pharmacist if you have questions. ?COMMON BRAND NAME(S): Bynfezia, Sandostatin ?What should I tell my care team before I take this medication? ?They need to know if you have any of these conditions: ?diabetes ?gallbladder disease ?kidney disease ?liver disease ?thyroid disease ?an unusual or allergic reaction to octreotide, other medicines, foods, dyes, or preservatives ?pregnant or trying to get pregnant ?breast-feeding ?How should I use this medication? ?This medication is injected under the skin or into a vein. It is usually given by your care team in a hospital or clinic setting. ?If you get this medication at home, you will be taught how to prepare and give it. Use exactly as directed. Take it as directed on the prescription label at the same time every day. Keep taking it unless your care team tells you to stop. ?Allow the injection solution to come to room temperature before use. Do not warm it artificially. ?It is important that you put your used needles and syringes in a special sharps container. Do not put them in a trash can. If you do not have a sharps container, call your pharmacist or care team to get one. ?Talk to your care team about the use of this medication in children. Special care may be needed. ?Overdosage: If you think you have taken too much of this medicine contact a poison control center or emergency room at once. ?NOTE: This medicine is only for you. Do not share this medicine with others. ?What if I miss a dose? ?If you miss a dose, take it as soon as you can. If it is almost time for your next dose, take only that dose. Do not take double  or extra doses. ?What may interact with this medication? ?bromocriptine ?certain medicines for blood pressure, heart disease, irregular heartbeat ?cyclosporine ?diuretics ?medicines for diabetes, including insulin ?quinidine ?This list may not describe all possible interactions. Give your health care provider a list of all the medicines, herbs, non-prescription drugs, or dietary supplements you use. Also tell them if you smoke, drink alcohol, or use illegal drugs. Some items may interact with your medicine. ?What should I watch for while using this medication? ?Visit your care team for regular checks on your progress. Tell your care team if your symptoms do not start to get better or if they get worse. ?To help reduce irritation at the injection site, use a different site for each injection and make sure the solution is at room temperature before use. ?This medication may cause decreases in blood sugar. Signs of low blood sugar include chills, cool, pale skin or cold sweats, drowsiness, extreme hunger, fast heartbeat, headache, nausea, nervousness or anxiety, shakiness, trembling, unsteadiness, tiredness, or weakness. Contact your care team right away if you experience any of these symptoms. ?This medication may increase blood sugar. The risk may be higher in patients who already have diabetes. Ask your care team what you can do to lower your risk of diabetes while taking this medication. ?You should make sure you get enough vitamin B12 while you are taking this medication. Discuss the foods you eat and the vitamins you take with your care team. ?What side effects may I notice from receiving   this medication? ?Side effects that you should report to your doctor or health care professional as soon as possible: ?allergic reactions like skin rash, itching or hives, swelling of the face, lips, or tongue ?fast, slow, or irregular heartbeat ?right upper belly pain ?severe stomach pain ?signs and symptoms of high blood sugar  such as being more thirsty or hungry or having to urinate more than normal. You may also feel very tired or have blurry vision. ?signs and symptoms of low blood sugar such as feeling anxious; confusion; dizziness; increased hunger; unusually weak or tired; increased sweating; shakiness; cold, clammy skin; irritable; headache; blurred vision; fast heartbeat; loss of consciousness ?unusually weak or tired ?Side effects that usually do not require medical attention (report to your doctor or health care professional if they continue or are bothersome): ?diarrhea ?dizziness ?gas ?headache ?nausea, vomiting ?pain, redness, or irritation at site where injected ?upset stomach ?This list may not describe all possible side effects. Call your doctor for medical advice about side effects. You may report side effects to FDA at 1-800-FDA-1088. ?Where should I keep my medication? ?Keep out of the reach of children and pets. ?Store in the refrigerator. Protect from light. Allow to come to room temperature naturally. Do not use artificial heat. If protected from light, the injection may be stored between 20 and 30 degrees C (70 and 86 degrees F) for 14 days. After the initial use, throw away any unused portion of a multiple dose vial after 14 days. Get rid of any unused portions of the ampules after use. ?To get rid of medications that are no longer needed or have expired: ?Take the medication to a medication take-back program. Ask your pharmacy or law enforcement to find a location. ?If you cannot return the medication, ask your pharmacist or care team how to get rid of the medication safely. ?NOTE: This sheet is a summary. It may not cover all possible information. If you have questions about this medicine, talk to your doctor, pharmacist, or health care provider. ?? 2023 Elsevier/Gold Standard (2020-12-15 00:00:00) ? ?

## 2021-05-29 ENCOUNTER — Telehealth: Payer: Self-pay

## 2021-05-29 ENCOUNTER — Ambulatory Visit: Payer: Self-pay

## 2021-05-29 NOTE — Telephone Encounter (Signed)
Patient gave verbal understanding and had no further questions or concerns  

## 2021-05-29 NOTE — Telephone Encounter (Signed)
-----   Message from Ladell Pier, MD sent at 05/29/2021  7:06 AM EDT ----- Please call patient, DOTATATE scan is stable, no evidence of disease progression, f/u as scheduled

## 2021-06-29 ENCOUNTER — Inpatient Hospital Stay: Payer: BC Managed Care – PPO

## 2021-06-29 ENCOUNTER — Inpatient Hospital Stay: Payer: BC Managed Care – PPO | Attending: Oncology

## 2021-06-29 VITALS — BP 93/75 | HR 51 | Temp 97.9°F

## 2021-06-29 DIAGNOSIS — C7A019 Malignant carcinoid tumor of the small intestine, unspecified portion: Secondary | ICD-10-CM | POA: Diagnosis present

## 2021-06-29 DIAGNOSIS — C7B02 Secondary carcinoid tumors of liver: Secondary | ICD-10-CM | POA: Diagnosis present

## 2021-06-29 DIAGNOSIS — Z79899 Other long term (current) drug therapy: Secondary | ICD-10-CM | POA: Diagnosis not present

## 2021-06-29 DIAGNOSIS — R978 Other abnormal tumor markers: Secondary | ICD-10-CM | POA: Diagnosis not present

## 2021-06-29 LAB — CMP (CANCER CENTER ONLY)
ALT: 11 U/L (ref 0–44)
AST: 19 U/L (ref 15–41)
Albumin: 4.2 g/dL (ref 3.5–5.0)
Alkaline Phosphatase: 56 U/L (ref 38–126)
Anion gap: 9 (ref 5–15)
BUN: 13 mg/dL (ref 6–20)
CO2: 31 mmol/L (ref 22–32)
Calcium: 9.6 mg/dL (ref 8.9–10.3)
Chloride: 102 mmol/L (ref 98–111)
Creatinine: 0.78 mg/dL (ref 0.44–1.00)
GFR, Estimated: >60 mL/min (ref >60)
Glucose, Bld: 90 mg/dL (ref 70–99)
Potassium: 4 mmol/L (ref 3.5–5.1)
Sodium: 142 mmol/L (ref 135–145)
Total Bilirubin: 0.6 mg/dL (ref 0.3–1.2)
Total Protein: 6.9 g/dL (ref 6.5–8.1)

## 2021-06-29 LAB — CBC WITH DIFFERENTIAL (CANCER CENTER ONLY)
Abs Immature Granulocytes: 0.01 10*3/uL (ref 0.00–0.07)
Basophils Absolute: 0 10*3/uL (ref 0.0–0.1)
Basophils Relative: 1 %
Eosinophils Absolute: 0.2 10*3/uL (ref 0.0–0.5)
Eosinophils Relative: 4 %
HCT: 43 % (ref 36.0–46.0)
Hemoglobin: 13.7 g/dL (ref 12.0–15.0)
Immature Granulocytes: 0 %
Lymphocytes Relative: 40 %
Lymphs Abs: 1.7 10*3/uL (ref 0.7–4.0)
MCH: 29.2 pg (ref 26.0–34.0)
MCHC: 31.9 g/dL (ref 30.0–36.0)
MCV: 91.7 fL (ref 80.0–100.0)
Monocytes Absolute: 0.3 10*3/uL (ref 0.1–1.0)
Monocytes Relative: 7 %
Neutro Abs: 2 10*3/uL (ref 1.7–7.7)
Neutrophils Relative %: 48 %
Platelet Count: 188 10*3/uL (ref 150–400)
RBC: 4.69 MIL/uL (ref 3.87–5.11)
RDW: 12.8 % (ref 11.5–15.5)
WBC Count: 4.2 10*3/uL (ref 4.0–10.5)
nRBC: 0 % (ref 0.0–0.2)

## 2021-06-29 MED ORDER — OCTREOTIDE ACETATE 30 MG IM KIT
30.0000 mg | PACK | Freq: Once | INTRAMUSCULAR | Status: AC
  Administered 2021-06-29: 30 mg via INTRAMUSCULAR
  Filled 2021-06-29: qty 1

## 2021-07-02 LAB — CHROMOGRANIN A: Chromogranin A (ng/mL): 60 ng/mL (ref 0.0–101.8)

## 2021-07-26 ENCOUNTER — Other Ambulatory Visit: Payer: Self-pay

## 2021-07-26 ENCOUNTER — Inpatient Hospital Stay: Payer: BC Managed Care – PPO | Attending: Oncology | Admitting: Oncology

## 2021-07-26 ENCOUNTER — Inpatient Hospital Stay: Payer: BC Managed Care – PPO

## 2021-07-26 VITALS — BP 111/79 | HR 63 | Temp 97.9°F | Resp 18 | Wt 130.2 lb

## 2021-07-26 DIAGNOSIS — C7B02 Secondary carcinoid tumors of liver: Secondary | ICD-10-CM | POA: Insufficient documentation

## 2021-07-26 DIAGNOSIS — D509 Iron deficiency anemia, unspecified: Secondary | ICD-10-CM | POA: Diagnosis not present

## 2021-07-26 DIAGNOSIS — Z79899 Other long term (current) drug therapy: Secondary | ICD-10-CM | POA: Insufficient documentation

## 2021-07-26 DIAGNOSIS — C7A019 Malignant carcinoid tumor of the small intestine, unspecified portion: Secondary | ICD-10-CM | POA: Insufficient documentation

## 2021-07-26 MED ORDER — OCTREOTIDE ACETATE 30 MG IM KIT
30.0000 mg | PACK | Freq: Once | INTRAMUSCULAR | Status: AC
  Administered 2021-07-26: 30 mg via INTRAMUSCULAR
  Filled 2021-07-26: qty 1

## 2021-07-26 NOTE — Progress Notes (Signed)
[facility] OFFICE PROGRESS NOTE   Diagnosis: Carcinoid tumor  INTERVAL HISTORY:   Brittney [name] returns as scheduled.  She feels well.  She is exercising.  No difficulty with bowel function.  No diarrhea or flushing.  She has noted an enlarged lymph node in the left posterior neck.  This has been present in the past.  Objective:  Vital signs in last 24 hours:  Blood pressure 111/79, pulse 63, temperature 97.9 F (36.6 C), temperature source Oral, resp. rate 18, weight 130 lb 3.2 oz (59.1 kg), last menstrual period 01/31/2014, SpO2 98 %.    HEENT: Neck without mass Lymphatics: Pea-sized left greater than right high posterior cervical nodes, pea-sized bilateral inguinal nodes, no axillary nodes Resp: Lungs clear bilateral Cardio: Regular rate and rhythm GI: Nontender, no mass, no hepatosplenomegaly Vascular: No leg edema  Lab Results:  Lab Results  Component Value Date   WBC 4.2 06/29/2021   HGB 13.7 06/29/2021   HCT 43.0 06/29/2021   MCV 91.7 06/29/2021   PLT 188 06/29/2021   NEUTROABS 2.0 06/29/2021    CMP  Lab Results  Component Value Date   NA 142 06/29/2021   K 4.0 06/29/2021   CL 102 06/29/2021   CO2 31 06/29/2021   GLUCOSE 90 06/29/2021   BUN 13 06/29/2021   CREATININE 0.78 06/29/2021   CALCIUM 9.6 06/29/2021   PROT 6.9 06/29/2021   ALBUMIN 4.2 06/29/2021   AST 19 06/29/2021   ALT 11 06/29/2021   ALKPHOS 56 06/29/2021   BILITOT 0.6 06/29/2021   GFRNONAA >60 06/29/2021   GFRAA >60 01/06/2019  06/29/2021: Chromogranin A-60  Medications: I have reviewed the patient's current medications.   Assessment/Plan: Metastatic neuroendocrine tumor MRI of the abdomen 10/02/2016 confirmed multiple enhancing liver lesions consistent with metastases, no primary tumor site identified Ultrasound-guided biopsy of a left liver lesion 10/07/2016-neuroendocrine neoplasm, "intermediate grade ", review of pathology at GI tumor conference consistent with a  low-grade carcinoid tumor Elevated chromogranin A 10/15/2016  gallium-DOTATATE scan 10/23/2016-multiple foci of liver metastases, enlarged central mesenteric lymph nodes, short intussusception's in adjacent small bowel felt to represent a primary small bowel tumor, additional mesenteric lymph nodes with metabolic activity, deep right pelvic nodule consistent with a pelvic metastasis Small bowel resection 11/21/2016- mid ileum mass, low-grade neuroendocrine tumor,pT4,pN2, 6/18 lymph nodes positive, positive mesenteric resection margin (lymph node), intramural satellite nodule CT 11/30/2016-small bowel obstruction with transition point adjacent to suture line at the distal small bowel, liver metastases similar to the 10/02/2016 MRI Recurrent obstructive symptoms requiring repeat surgery with resection of the small bowel anastomosis site 12/13/2016, pathology negative for malignancy Initiation of monthly Sandostatin 12/20/2016 CT abdomen/pelvis 02/19/2017-mild enlargement of liver lesions compared to November 2018 Cycle 1 Lutathera 06/17/2017 Cycle 2 Lutathera 08/13/2017 Cycle 3 Lutathera 10/07/2017 Cycle 4 Lutathera 12/02/2017 Dotatate scan 12/29/2017-no evidence of new or metastatic disease or progression.  Multiple hepatic metastasis.  Lesions have decreased in size from the most recent CT scan and slightly increased in radiotracer activity from dotatate PET scan 10/23/2016.  Interval resection of small bowel tumor and bulky mesenteric metastasis.  No residual activity in the bowel or mesentery.  Single small focus of activity in the deep right pelvis along the peritoneal surface not changed from comparison exam. Monthly Sandostatin continued Netspot 06/23/2018- mild decrease in radiotracer activity of all hepatic metastases, no new lesions, slight enlargement of several liver lesions Monthly Sandostatin continued Netspot 03/31/2019-no new liver lesions, no change in the size of liver lesions  with a mild  decrease in tracer activity involving several lesions, stable right pelvic peritoneal implant Monthly Sandostatin continued Netspot 12/07/2019-no new liver lesion, focus of activity left facet joint at C4-C5 has increased, intense activity associated with fractures of the left ninth and 10th ribs, unchanged size and number of liver lesions with increase in radiotracer activity-potentially indicating mild progression Netspot 08/14/2020-decrease in radiotracer activity associated with liver lesions, no new lesions, the liver lesions have not enlarged, decreased radiotracer activity associated with a small right pelvic peritoneal implant, decreased activity associated with degenerative changes in the cervical and thoracic spine, no evidence of progressive disease Monthly Sandostatin continued Netspot 05/28/2021-stable multifocal radiotracer avid liver metastases, no increase in size, no new liver lesions, stable small deep right pelvic implant, no evidence of progressive disease Monthly Sandostatin continue   2.  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  Intermittent abdominal bloating and left-sided abdominal pain- resolved   4.  Admission 11/30/2016 with a small bowel obstruction       Disposition: Ms. Brittney [name] appears stable.  The restaging dotatate PET in May revealed no evidence of disease progression.  The chromogranin a remains normal.  She appears asymptomatic from the metastatic carcinoid tumor.  She will continue monthly Sandostatin.  She will return for an office visit in 4 months.  The small neck and groin nodes are likely benign.  She will call for an enlarging lymph node.  Brittney Coder, MD  07/26/2021  10:21 AM

## 2021-07-26 NOTE — Patient Instructions (Signed)
Octreotide injection solution ?What is this medication? ?OCTREOTIDE (ok TREE oh tide) is used to reduce blood levels of growth hormone in patients with a condition called acromegaly. This medicine also reduces flushing and watery diarrhea caused by certain types of cancer. ?This medicine may be used for other purposes; ask your health care provider or pharmacist if you have questions. ?COMMON BRAND NAME(S): Bynfezia, Sandostatin ?What should I tell my care team before I take this medication? ?They need to know if you have any of these conditions: ?diabetes ?gallbladder disease ?kidney disease ?liver disease ?thyroid disease ?an unusual or allergic reaction to octreotide, other medicines, foods, dyes, or preservatives ?pregnant or trying to get pregnant ?breast-feeding ?How should I use this medication? ?This medication is injected under the skin or into a vein. It is usually given by your care team in a hospital or clinic setting. ?If you get this medication at home, you will be taught how to prepare and give it. Use exactly as directed. Take it as directed on the prescription label at the same time every day. Keep taking it unless your care team tells you to stop. ?Allow the injection solution to come to room temperature before use. Do not warm it artificially. ?It is important that you put your used needles and syringes in a special sharps container. Do not put them in a trash can. If you do not have a sharps container, call your pharmacist or care team to get one. ?Talk to your care team about the use of this medication in children. Special care may be needed. ?Overdosage: If you think you have taken too much of this medicine contact a poison control center or emergency room at once. ?NOTE: This medicine is only for you. Do not share this medicine with others. ?What if I miss a dose? ?If you miss a dose, take it as soon as you can. If it is almost time for your next dose, take only that dose. Do not take double  or extra doses. ?What may interact with this medication? ?bromocriptine ?certain medicines for blood pressure, heart disease, irregular heartbeat ?cyclosporine ?diuretics ?medicines for diabetes, including insulin ?quinidine ?This list may not describe all possible interactions. Give your health care provider a list of all the medicines, herbs, non-prescription drugs, or dietary supplements you use. Also tell them if you smoke, drink alcohol, or use illegal drugs. Some items may interact with your medicine. ?What should I watch for while using this medication? ?Visit your care team for regular checks on your progress. Tell your care team if your symptoms do not start to get better or if they get worse. ?To help reduce irritation at the injection site, use a different site for each injection and make sure the solution is at room temperature before use. ?This medication may cause decreases in blood sugar. Signs of low blood sugar include chills, cool, pale skin or cold sweats, drowsiness, extreme hunger, fast heartbeat, headache, nausea, nervousness or anxiety, shakiness, trembling, unsteadiness, tiredness, or weakness. Contact your care team right away if you experience any of these symptoms. ?This medication may increase blood sugar. The risk may be higher in patients who already have diabetes. Ask your care team what you can do to lower your risk of diabetes while taking this medication. ?You should make sure you get enough vitamin B12 while you are taking this medication. Discuss the foods you eat and the vitamins you take with your care team. ?What side effects may I notice from receiving   this medication? ?Side effects that you should report to your doctor or health care professional as soon as possible: ?allergic reactions like skin rash, itching or hives, swelling of the face, lips, or tongue ?fast, slow, or irregular heartbeat ?right upper belly pain ?severe stomach pain ?signs and symptoms of high blood sugar  such as being more thirsty or hungry or having to urinate more than normal. You may also feel very tired or have blurry vision. ?signs and symptoms of low blood sugar such as feeling anxious; confusion; dizziness; increased hunger; unusually weak or tired; increased sweating; shakiness; cold, clammy skin; irritable; headache; blurred vision; fast heartbeat; loss of consciousness ?unusually weak or tired ?Side effects that usually do not require medical attention (report to your doctor or health care professional if they continue or are bothersome): ?diarrhea ?dizziness ?gas ?headache ?nausea, vomiting ?pain, redness, or irritation at site where injected ?upset stomach ?This list may not describe all possible side effects. Call your doctor for medical advice about side effects. You may report side effects to FDA at 1-800-FDA-1088. ?Where should I keep my medication? ?Keep out of the reach of children and pets. ?Store in the refrigerator. Protect from light. Allow to come to room temperature naturally. Do not use artificial heat. If protected from light, the injection may be stored between 20 and 30 degrees C (70 and 86 degrees F) for 14 days. After the initial use, throw away any unused portion of a multiple dose vial after 14 days. Get rid of any unused portions of the ampules after use. ?To get rid of medications that are no longer needed or have expired: ?Take the medication to a medication take-back program. Ask your pharmacy or law enforcement to find a location. ?If you cannot return the medication, ask your pharmacist or care team how to get rid of the medication safely. ?NOTE: This sheet is a summary. It may not cover all possible information. If you have questions about this medicine, talk to your doctor, pharmacist, or health care provider. ?? 2023 Elsevier/Gold Standard (2020-12-15 00:00:00) ? ?

## 2021-08-20 ENCOUNTER — Encounter: Payer: Self-pay | Admitting: Oncology

## 2021-08-28 ENCOUNTER — Ambulatory Visit: Payer: BC Managed Care – PPO

## 2021-08-29 ENCOUNTER — Inpatient Hospital Stay: Payer: BC Managed Care – PPO | Attending: Oncology

## 2021-08-29 VITALS — BP 100/67 | HR 63 | Temp 98.0°F | Resp 16

## 2021-08-29 DIAGNOSIS — C7A019 Malignant carcinoid tumor of the small intestine, unspecified portion: Secondary | ICD-10-CM | POA: Insufficient documentation

## 2021-08-29 DIAGNOSIS — Z79899 Other long term (current) drug therapy: Secondary | ICD-10-CM | POA: Diagnosis not present

## 2021-08-29 DIAGNOSIS — C7B02 Secondary carcinoid tumors of liver: Secondary | ICD-10-CM | POA: Diagnosis present

## 2021-08-29 MED ORDER — OCTREOTIDE ACETATE 30 MG IM KIT
30.0000 mg | PACK | Freq: Once | INTRAMUSCULAR | Status: AC
  Administered 2021-08-29: 30 mg via INTRAMUSCULAR
  Filled 2021-08-29: qty 1

## 2021-08-29 NOTE — Patient Instructions (Signed)
Octreotide Injection Solution What is this medication? OCTREOTIDE (ok TREE oh tide) treats high levels of growth hormone (acromegaly). It works by reducing the amount of growth hormone your body makes. This reduces symptoms and the risk of health problems caused by too much growth hormone, such as diabetes and heart disease. It may also be used to treat diarrhea caused by neuroendocrine tumors. It works by slowing down the release of serotonin from the tumor cells. This reduces the number of bowel movements you have. This medicine may be used for other purposes; ask your health care provider or pharmacist if you have questions. COMMON BRAND NAME(S): Bynfezia, Sandostatin What should I tell my care team before I take this medication? They need to know if you have any of these conditions: Diabetes Gallbladder disease Kidney disease Liver disease Thyroid disease An unusual or allergic reaction to octreotide, other medications, foods, dyes, or preservatives Pregnant or trying to get pregnant Breast-feeding How should I use this medication? This medication is injected under the skin or into a vein. It is usually given by your care team in a hospital or clinic setting. If you get this medication at home, you will be taught how to prepare and give it. Use exactly as directed. Take it as directed on the prescription label at the same time every day. Keep taking it unless your care team tells you to stop. Allow the injection solution to come to room temperature before use. Do not warm it artificially. It is important that you put your used needles and syringes in a special sharps container. Do not put them in a trash can. If you do not have a sharps container, call your pharmacist or care team to get one. Talk to your care team about the use of this medication in children. Special care may be needed. Overdosage: If you think you have taken too much of this medicine contact a poison control center or  emergency room at once. NOTE: This medicine is only for you. Do not share this medicine with others. What if I miss a dose? If you miss a dose, take it as soon as you can. If it is almost time for your next dose, take only that dose. Do not take double or extra doses. What may interact with this medication? Bromocriptine Certain medications for blood pressure, heart disease, irregular heartbeat Cyclosporine Diuretics Medications for diabetes, including insulin Quinidine This list may not describe all possible interactions. Give your health care provider a list of all the medicines, herbs, non-prescription drugs, or dietary supplements you use. Also tell them if you smoke, drink alcohol, or use illegal drugs. Some items may interact with your medicine. What should I watch for while using this medication? Visit your care team for regular checks on your progress. Tell your care team if your symptoms do not start to get better or if they get worse. To help reduce irritation at the injection site, use a different site for each injection and make sure the solution is at room temperature before use. This medication may cause decreases in blood sugar. Signs of low blood sugar include chills, cool, pale skin or cold sweats, drowsiness, extreme hunger, fast heartbeat, headache, nausea, nervousness or anxiety, shakiness, trembling, unsteadiness, tiredness, or weakness. Contact your care team right away if you experience any of these symptoms. This medication may increase blood sugar. The risk may be higher in patients who already have diabetes. Ask your care team what you can do to lower your   risk of diabetes while taking this medication. You should make sure you get enough vitamin B12 while you are taking this medication. Discuss the foods you eat and the vitamins you take with your care team. What side effects may I notice from receiving this medication? Side effects that you should report to your care  team as soon as possible: Allergic reactions--skin rash, itching, hives, swelling of the face, lips, tongue, or throat Gallbladder problems--severe stomach pain, nausea, vomiting, fever Heart rhythm changes--fast or irregular heartbeat, dizziness, feeling faint or lightheaded, chest pain, trouble breathing High blood sugar (hyperglycemia)--increased thirst or amount of urine, unusual weakness or fatigue, blurry vision Low blood sugar (hypoglycemia)--tremors or shaking, anxiety, sweating, cold or clammy skin, confusion, dizziness, rapid heartbeat Low thyroid levels (hypothyroidism)--unusual weakness or fatigue, increased sensitivity to cold, constipation, hair loss, dry skin, weight gain, feelings of depression Low vitamin B12 level--pain, tingling, or numbness in the hands or feet, muscle weakness, dizziness, confusion, trouble concentrating Pancreatitis--severe stomach pain that spreads to your back or gets worse after eating or when touched, fever, nausea, vomiting Side effects that usually do not require medical attention (report to your care team if they continue or are bothersome): Diarrhea Dizziness Gas Headache Pain, redness, or irritation at injection site Stomach pain This list may not describe all possible side effects. Call your doctor for medical advice about side effects. You may report side effects to FDA at 1-800-FDA-1088. Where should I keep my medication? Keep out of the reach of children and pets. Store in the refrigerator. Protect from light. Allow to come to room temperature naturally. Do not use artificial heat. If protected from light, the injection may be stored between 20 and 30 degrees C (70 and 86 degrees F) for 14 days. After the initial use, throw away any unused portion of a multiple dose vial after 14 days. Get rid of any unused portions of the ampules after use. To get rid of medications that are no longer needed or have expired: Take the medication to a medication  take-back program. Ask your pharmacy or law enforcement to find a location. If you cannot return the medication, ask your pharmacist or care team how to get rid of the medication safely. NOTE: This sheet is a summary. It may not cover all possible information. If you have questions about this medicine, talk to your doctor, pharmacist, or health care provider.  2023 Elsevier/Gold Standard (2007-02-14 00:00:00)  

## 2021-09-28 ENCOUNTER — Inpatient Hospital Stay: Payer: BC Managed Care – PPO | Attending: Oncology

## 2021-09-28 VITALS — BP 109/72 | HR 60 | Temp 98.5°F | Resp 18 | Ht 65.0 in | Wt 131.8 lb

## 2021-09-28 DIAGNOSIS — C7B02 Secondary carcinoid tumors of liver: Secondary | ICD-10-CM | POA: Diagnosis present

## 2021-09-28 DIAGNOSIS — Z79899 Other long term (current) drug therapy: Secondary | ICD-10-CM | POA: Insufficient documentation

## 2021-09-28 DIAGNOSIS — C7A019 Malignant carcinoid tumor of the small intestine, unspecified portion: Secondary | ICD-10-CM | POA: Insufficient documentation

## 2021-09-28 MED ORDER — OCTREOTIDE ACETATE 30 MG IM KIT
30.0000 mg | PACK | Freq: Once | INTRAMUSCULAR | Status: AC
  Administered 2021-09-28: 30 mg via INTRAMUSCULAR
  Filled 2021-09-28: qty 1

## 2021-09-28 NOTE — Patient Instructions (Signed)
[facility]   Discharge Instructions: Thank you for choosing [facility] to provide your oncology and hematology care.   If you have a lab appointment with the [facility], please go directly to the [facility] and check in at the registration area.   Wear comfortable clothing and clothing appropriate for easy access to any Portacath or PICC line.   We strive to give you quality time with your provider. You may need to reschedule your appointment if you arrive late (15 or more minutes).  Arriving late affects you and other patients whose appointments are after yours.  Also, if you miss three or more appointments without notifying the office, you may be dismissed from the clinic at the provider's discretion.      For prescription refill requests, have your pharmacy contact our office and allow 72 hours for refills to be completed.    Today you received the following chemotherapy and/or immunotherapy agents Sandostatin      To help prevent nausea and vomiting after your treatment, we encourage you to take your nausea medication as directed.  BELOW ARE SYMPTOMS THAT SHOULD BE REPORTED IMMEDIATELY: *FEVER GREATER THAN 100.4 F (38 C) OR HIGHER *CHILLS OR SWEATING *NAUSEA AND VOMITING THAT IS NOT CONTROLLED WITH YOUR NAUSEA MEDICATION *UNUSUAL SHORTNESS OF BREATH *UNUSUAL BRUISING OR BLEEDING *URINARY PROBLEMS (pain or burning when urinating, or frequent urination) *BOWEL PROBLEMS (unusual diarrhea, constipation, pain near the anus) TENDERNESS IN MOUTH AND THROAT WITH OR WITHOUT PRESENCE OF ULCERS (sore throat, sores in mouth, or a toothache) UNUSUAL RASH, SWELLING OR PAIN  UNUSUAL VAGINAL DISCHARGE OR ITCHING   Items with * indicate a potential emergency and should be followed up as soon as possible or go to the Emergency Department if any problems should occur.  Please show the CHEMOTHERAPY ALERT CARD or IMMUNOTHERAPY ALERT CARD at check-in to  the Emergency Department and triage nurse.  Should you have questions after your visit or need to cancel or reschedule your appointment, please contact [facility]  Dept: [phone]  and follow the prompts.  Office hours are 8:00 a.m. to 4:30 p.m. Monday - Friday. Please note that voicemails left after 4:00 p.m. may not be returned until the following business day.  We are closed weekends and major holidays. You have access to a nurse at all times for urgent questions. Please call the main number to the clinic Dept: [phone] and follow the prompts.   For any non-urgent questions, you may also contact your provider using MyChart. We now offer e-Visits for anyone 80 and older to request care online for non-urgent symptoms. For details visit [email or web address].   Also download the MyChart app! Go to the app store, search "MyChart", open the app, select [facility], and log in with your MyChart username and password.  Masks are optional in the cancer centers. If you would like for your care team to wear a mask while they are taking care of you, please let them know. You may have one support person who is at least 56 years old accompany you for your appointments.  Octreotide Injection Solution What is this medication? OCTREOTIDE (ok TREE oh tide) treats high levels of growth hormone (acromegaly). It works by reducing the amount of growth hormone your body makes. This reduces symptoms and the risk of health problems caused by too much growth hormone, such as diabetes and heart disease. It may also be used to treat diarrhea  caused by neuroendocrine tumors. It works by slowing down the release of serotonin from the tumor cells. This reduces the number of bowel movements you have. This medicine may be used for other purposes; ask your health care provider or pharmacist if you have questions. COMMON BRAND NAME(S): Leatha Gilding, Sandostatin What should I tell my care team  before I take this medication? They need to know if you have any of these conditions: Diabetes Gallbladder disease Kidney disease Liver disease Thyroid disease An unusual or allergic reaction to octreotide, other medications, foods, dyes, or preservatives Pregnant or trying to get pregnant Breast-feeding How should I use this medication? This medication is injected under the skin or into a vein. It is usually given by your care team in a hospital or clinic setting. If you get this medication at home, you will be taught how to prepare and give it. Use exactly as directed. Take it as directed on the prescription label at the same time every day. Keep taking it unless your care team tells you to stop. Allow the injection solution to come to room temperature before use. Do not warm it artificially. It is important that you put your used needles and syringes in a special sharps container. Do not put them in a trash can. If you do not have a sharps container, call your pharmacist or care team to get one. Talk to your care team about the use of this medication in children. Special care may be needed. Overdosage: If you think you have taken too much of this medicine contact a poison control center or emergency room at once. NOTE: This medicine is only for you. Do not share this medicine with others. What if I miss a dose? If you miss a dose, take it as soon as you can. If it is almost time for your next dose, take only that dose. Do not take double or extra doses. What may interact with this medication? Bromocriptine Certain medications for blood pressure, heart disease, irregular heartbeat Cyclosporine Diuretics Medications for diabetes, including insulin Quinidine This list may not describe all possible interactions. Give your health care provider a list of all the medicines, herbs, non-prescription drugs, or dietary supplements you use. Also tell them if you smoke, drink alcohol, or use illegal  drugs. Some items may interact with your medicine. What should I watch for while using this medication? Visit your care team for regular checks on your progress. Tell your care team if your symptoms do not start to get better or if they get worse. To help reduce irritation at the injection site, use a different site for each injection and make sure the solution is at room temperature before use. This medication may cause decreases in blood sugar. Signs of low blood sugar include chills, cool, pale skin or cold sweats, drowsiness, extreme hunger, fast heartbeat, headache, nausea, nervousness or anxiety, shakiness, trembling, unsteadiness, tiredness, or weakness. Contact your care team right away if you experience any of these symptoms. This medication may increase blood sugar. The risk may be higher in patients who already have diabetes. Ask your care team what you can do to lower your risk of diabetes while taking this medication. You should make sure you get enough vitamin B12 while you are taking this medication. Discuss the foods you eat and the vitamins you take with your care team. What side effects may I notice from receiving this medication? Side effects that you should report to your care team as soon as  possible: Allergic reactions--skin rash, itching, hives, swelling of the face, lips, tongue, or throat Gallbladder problems--severe stomach pain, nausea, vomiting, fever Heart rhythm changes--fast or irregular heartbeat, dizziness, feeling faint or lightheaded, chest pain, trouble breathing High blood sugar (hyperglycemia)--increased thirst or amount of urine, unusual weakness or fatigue, blurry vision Low blood sugar (hypoglycemia)--tremors or shaking, anxiety, sweating, cold or clammy skin, confusion, dizziness, rapid heartbeat Low thyroid levels (hypothyroidism)--unusual weakness or fatigue, increased sensitivity to cold, constipation, hair loss, dry skin, weight gain, feelings of  depression Low vitamin B12 level--pain, tingling, or numbness in the hands or feet, muscle weakness, dizziness, confusion, trouble concentrating Pancreatitis--severe stomach pain that spreads to your back or gets worse after eating or when touched, fever, nausea, vomiting Side effects that usually do not require medical attention (report to your care team if they continue or are bothersome): Diarrhea Dizziness Gas Headache Pain, redness, or irritation at injection site Stomach pain This list may not describe all possible side effects. Call your doctor for medical advice about side effects. You may report side effects to FDA at 1-800-FDA-1088. Where should I keep my medication? Keep out of the reach of children and pets. Store in the refrigerator. Protect from light. Allow to come to room temperature naturally. Do not use artificial heat. If protected from light, the injection may be stored between 20 and 30 degrees C (70 and 86 degrees F) for 14 days. After the initial use, throw away any unused portion of a multiple dose vial after 14 days. Get rid of any unused portions of the ampules after use. To get rid of medications that are no longer needed or have expired: Take the medication to a medication take-back program. Ask your pharmacy or law enforcement to find a location. If you cannot return the medication, ask your pharmacist or care team how to get rid of the medication safely. NOTE: This sheet is a summary. It may not cover all possible information. If you have questions about this medicine, talk to your doctor, pharmacist, or health care provider.  2023 Elsevier/Gold Standard (2007-02-14 00:00:00)

## 2021-09-29 ENCOUNTER — Encounter: Payer: Self-pay | Admitting: Oncology

## 2021-10-01 ENCOUNTER — Telehealth: Payer: Self-pay

## 2021-10-01 NOTE — Telephone Encounter (Signed)
Called Mrs. Cumpton back in response to her Deloris Ping message about her last experience here getting her sandostatin injection.  In the past, a certain LPN has given her injections and they had a rhythm and rapport together. When she arrived for her past appt, she had made a joke with the RN saying "are you the one Santiago Glad trained to do me?"  This did not set well with the RN and the RN explained that all nurses here are trained to do injections.  At this point, the patient felt there was a bad "energy" between her and the nurse.  The RN asked if anyone else could give her the injection at the patient's request.  No one else was able to give her the injection.  Ultimately, the RN who was originally assigned to her gave her the injection and the patient went home. The patient reported nausea and fatigue as a side effect as well as bruising at the site after this injection.  She gets monthly injections and I told her I would review with staff appropriate injection process.  I explained that we cannot manually warm injections because it can affect the stability of the drug but we warmed them to room temperature which her sandostatin was.  I also explained that we cannot massage the injection area either because it can affect the absorption of the drug.  I informed her that the next time she comes in, [place], Retail buyer, or myself, Surveyor, quantity would come meet with her and discuss a plan of care for her.  She drives 4 hours to come to [place] and she knows what does and doesn't work since she has been getting sandostatin for 5 years.  She felt better after we spoke and I told her I would also speak and counsel the RN that she had the encounter with.

## 2021-10-25 ENCOUNTER — Inpatient Hospital Stay: Payer: BC Managed Care – PPO

## 2021-10-25 ENCOUNTER — Other Ambulatory Visit: Payer: Self-pay

## 2021-10-25 ENCOUNTER — Inpatient Hospital Stay: Payer: BC Managed Care – PPO | Attending: Oncology

## 2021-10-25 DIAGNOSIS — C7A019 Malignant carcinoid tumor of the small intestine, unspecified portion: Secondary | ICD-10-CM | POA: Diagnosis present

## 2021-10-25 DIAGNOSIS — Z79899 Other long term (current) drug therapy: Secondary | ICD-10-CM | POA: Insufficient documentation

## 2021-10-25 DIAGNOSIS — C7B02 Secondary carcinoid tumors of liver: Secondary | ICD-10-CM | POA: Insufficient documentation

## 2021-10-25 LAB — CMP (CANCER CENTER ONLY)
ALT: 18 U/L (ref 0–44)
AST: 31 U/L (ref 15–41)
Albumin: 4.2 g/dL (ref 3.5–5.0)
Alkaline Phosphatase: 71 U/L (ref 38–126)
Anion gap: 4 — ABNORMAL LOW (ref 5–15)
BUN: 15 mg/dL (ref 6–20)
CO2: 33 mmol/L — ABNORMAL HIGH (ref 22–32)
Calcium: 9.5 mg/dL (ref 8.9–10.3)
Chloride: 104 mmol/L (ref 98–111)
Creatinine: 0.71 mg/dL (ref 0.44–1.00)
GFR, Estimated: >60 mL/min (ref >60)
Glucose, Bld: 128 mg/dL — ABNORMAL HIGH (ref 70–99)
Potassium: 4.2 mmol/L (ref 3.5–5.1)
Sodium: 141 mmol/L (ref 135–145)
Total Bilirubin: 0.5 mg/dL (ref 0.3–1.2)
Total Protein: 7.2 g/dL (ref 6.5–8.1)

## 2021-10-25 LAB — CBC WITH DIFFERENTIAL (CANCER CENTER ONLY)
Abs Immature Granulocytes: 0.01 10*3/uL (ref 0.00–0.07)
Basophils Absolute: 0 10*3/uL (ref 0.0–0.1)
Basophils Relative: 1 %
Eosinophils Absolute: 0.2 10*3/uL (ref 0.0–0.5)
Eosinophils Relative: 3 %
HCT: 42.9 % (ref 36.0–46.0)
Hemoglobin: 14.2 g/dL (ref 12.0–15.0)
Immature Granulocytes: 0 %
Lymphocytes Relative: 46 %
Lymphs Abs: 2.2 10*3/uL (ref 0.7–4.0)
MCH: 30 pg (ref 26.0–34.0)
MCHC: 33.1 g/dL (ref 30.0–36.0)
MCV: 90.7 fL (ref 80.0–100.0)
Monocytes Absolute: 0.4 10*3/uL (ref 0.1–1.0)
Monocytes Relative: 8 %
Neutro Abs: 2 10*3/uL (ref 1.7–7.7)
Neutrophils Relative %: 42 %
Platelet Count: 199 10*3/uL (ref 150–400)
RBC: 4.73 MIL/uL (ref 3.87–5.11)
RDW: 12.3 % (ref 11.5–15.5)
WBC Count: 4.8 10*3/uL (ref 4.0–10.5)
nRBC: 0 % (ref 0.0–0.2)

## 2021-10-25 MED ORDER — OCTREOTIDE ACETATE 30 MG IM KIT
30.0000 mg | PACK | Freq: Once | INTRAMUSCULAR | Status: AC
  Administered 2021-10-25: 30 mg via INTRAMUSCULAR
  Filled 2021-10-25: qty 1

## 2021-10-25 NOTE — Progress Notes (Signed)
Pt came to [facility] for injection. Injection done by Danae Orleans LPN

## 2021-10-29 LAB — CHROMOGRANIN A: Chromogranin A (ng/mL): 69.7 ng/mL (ref 0.0–101.8)

## 2021-11-22 ENCOUNTER — Inpatient Hospital Stay: Payer: BC Managed Care – PPO | Attending: Oncology | Admitting: Oncology

## 2021-11-22 ENCOUNTER — Inpatient Hospital Stay: Payer: BC Managed Care – PPO

## 2021-11-22 VITALS — BP 105/64 | HR 65 | Temp 98.2°F | Resp 18 | Ht 65.0 in | Wt 132.8 lb

## 2021-11-22 DIAGNOSIS — C7A019 Malignant carcinoid tumor of the small intestine, unspecified portion: Secondary | ICD-10-CM

## 2021-11-22 DIAGNOSIS — Z79899 Other long term (current) drug therapy: Secondary | ICD-10-CM | POA: Insufficient documentation

## 2021-11-22 DIAGNOSIS — C7B02 Secondary carcinoid tumors of liver: Secondary | ICD-10-CM | POA: Insufficient documentation

## 2021-11-22 MED ORDER — OCTREOTIDE ACETATE 30 MG IM KIT
30.0000 mg | PACK | Freq: Once | INTRAMUSCULAR | Status: AC
  Administered 2021-11-22: 30 mg via INTRAMUSCULAR
  Filled 2021-11-22: qty 1

## 2021-11-22 NOTE — Progress Notes (Signed)
[facility] OFFICE PROGRESS NOTE   Diagnosis: Carcinoid tumor  INTERVAL HISTORY:   Brittney [name] returns as scheduled.  She continues monthly Sandostatin.  She feels well.  No difficulty with bowel function.  She is exercising.  No complaint.  Objective:  Vital signs in last 24 hours:  Blood pressure 105/64, pulse 65, temperature 98.2 F (36.8 C), temperature source Oral, resp. rate 18, height '5\' 5"'$  (1.651 m), weight 132 lb 12.8 oz (60.2 kg), last menstrual period 01/31/2014, SpO2 100 %.    Lymphatics: Pea-sized low right posterior cervical/scalene node, pea-sized bilateral inguinal nodes Resp: Lungs clear bilaterally Cardio: Rate and rhythm GI: No hepatosplenomegaly, no mass, nontender, no apparent ascites Vascular: No leg edemaLab Results:  Lab Results  Component Value Date   WBC 4.8 10/25/2021   HGB 14.2 10/25/2021   HCT 42.9 10/25/2021   MCV 90.7 10/25/2021   PLT 199 10/25/2021   NEUTROABS 2.0 10/25/2021    CMP  Lab Results  Component Value Date   NA 141 10/25/2021   K 4.2 10/25/2021   CL 104 10/25/2021   CO2 33 (H) 10/25/2021   GLUCOSE 128 (H) 10/25/2021   BUN 15 10/25/2021   CREATININE 0.71 10/25/2021   CALCIUM 9.5 10/25/2021   PROT 7.2 10/25/2021   ALBUMIN 4.2 10/25/2021   AST 31 10/25/2021   ALT 18 10/25/2021   ALKPHOS 71 10/25/2021   BILITOT 0.5 10/25/2021   GFRNONAA >60 10/25/2021   GFRAA >60 01/06/2019    Medications: I have reviewed the patient's current medications.   Assessment/Plan: Metastatic neuroendocrine tumor MRI of the abdomen 10/02/2016 confirmed multiple enhancing liver lesions consistent with metastases, no primary tumor site identified Ultrasound-guided biopsy of a left liver lesion 10/07/2016-neuroendocrine neoplasm, "intermediate grade ", review of pathology at GI tumor conference consistent with a low-grade carcinoid tumor Elevated chromogranin A 10/15/2016  gallium-DOTATATE scan 10/23/2016-multiple foci of liver  metastases, enlarged central mesenteric lymph nodes, short intussusception's in adjacent small bowel felt to represent a primary small bowel tumor, additional mesenteric lymph nodes with metabolic activity, deep right pelvic nodule consistent with a pelvic metastasis Small bowel resection 11/21/2016- mid ileum mass, low-grade neuroendocrine tumor,pT4,pN2, 6/18 lymph nodes positive, positive mesenteric resection margin (lymph node), intramural satellite nodule CT 11/30/2016-small bowel obstruction with transition point adjacent to suture line at the distal small bowel, liver metastases similar to the 10/02/2016 MRI Recurrent obstructive symptoms requiring repeat surgery with resection of the small bowel anastomosis site 12/13/2016, pathology negative for malignancy Initiation of monthly Sandostatin 12/20/2016 CT abdomen/pelvis 02/19/2017-mild enlargement of liver lesions compared to November 2018 Cycle 1 Lutathera 06/17/2017 Cycle 2 Lutathera 08/13/2017 Cycle 3 Lutathera 10/07/2017 Cycle 4 Lutathera 12/02/2017 Dotatate scan 12/29/2017-no evidence of new or metastatic disease or progression.  Multiple hepatic metastasis.  Lesions have decreased in size from the most recent CT scan and slightly increased in radiotracer activity from dotatate PET scan 10/23/2016.  Interval resection of small bowel tumor and bulky mesenteric metastasis.  No residual activity in the bowel or mesentery.  Single small focus of activity in the deep right pelvis along the peritoneal surface not changed from comparison exam. Monthly Sandostatin continued Netspot 06/23/2018- mild decrease in radiotracer activity of all hepatic metastases, no new lesions, slight enlargement of several liver lesions Monthly Sandostatin continued Netspot 03/31/2019-no new liver lesions, no change in the size of liver lesions with a mild decrease in tracer activity involving several lesions, stable right pelvic peritoneal implant Monthly Sandostatin  continued Netspot 12/07/2019-no new liver  lesion, focus of activity left facet joint at C4-C5 has increased, intense activity associated with fractures of the left ninth and 10th ribs, unchanged size and number of liver lesions with increase in radiotracer activity-potentially indicating mild progression Netspot 08/14/2020-decrease in radiotracer activity associated with liver lesions, no new lesions, the liver lesions have not enlarged, decreased radiotracer activity associated with a small right pelvic peritoneal implant, decreased activity associated with degenerative changes in the cervical and thoracic spine, no evidence of progressive disease Monthly Sandostatin continued Netspot 05/28/2021-stable multifocal radiotracer avid liver metastases, no increase in size, no new liver lesions, stable small deep right pelvic implant, no evidence of progressive disease Monthly Sandostatin continue   2.  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  Intermittent abdominal bloating and left-sided abdominal pain- resolved   4.  Admission 11/30/2016 with a small bowel obstruction  Disposition: Brittney [name] appears stable.  There is no clinical evidence for progression of the carcinoid tumor.  The chromogranin A level was stable last month.  She will continue monthly Sandostatin.  She will return for an office visit in 3 months.  The plan is to schedule a repeat dotatate scan at a 1 year interval.  [name], MD  11/22/2021  10:36 AM

## 2021-12-09 ENCOUNTER — Encounter: Payer: Self-pay | Admitting: Oncology

## 2021-12-20 ENCOUNTER — Inpatient Hospital Stay: Payer: BC Managed Care – PPO | Attending: Oncology

## 2021-12-20 VITALS — BP 95/69 | HR 55 | Temp 98.1°F | Resp 16

## 2021-12-20 DIAGNOSIS — C7A019 Malignant carcinoid tumor of the small intestine, unspecified portion: Secondary | ICD-10-CM

## 2021-12-20 DIAGNOSIS — C7B02 Secondary carcinoid tumors of liver: Secondary | ICD-10-CM | POA: Insufficient documentation

## 2021-12-20 MED ORDER — OCTREOTIDE ACETATE 30 MG IM KIT
30.0000 mg | PACK | Freq: Once | INTRAMUSCULAR | Status: AC
  Administered 2021-12-20: 30 mg via INTRAMUSCULAR
  Filled 2021-12-20: qty 1

## 2022-01-16 ENCOUNTER — Inpatient Hospital Stay: Payer: BC Managed Care – PPO | Attending: Oncology

## 2022-01-16 ENCOUNTER — Inpatient Hospital Stay: Payer: BC Managed Care – PPO

## 2022-01-16 VITALS — BP 106/69 | HR 51 | Temp 97.8°F | Resp 16

## 2022-01-16 DIAGNOSIS — C7B02 Secondary carcinoid tumors of liver: Secondary | ICD-10-CM | POA: Diagnosis present

## 2022-01-16 DIAGNOSIS — C7A019 Malignant carcinoid tumor of the small intestine, unspecified portion: Secondary | ICD-10-CM | POA: Insufficient documentation

## 2022-01-16 DIAGNOSIS — R978 Other abnormal tumor markers: Secondary | ICD-10-CM | POA: Insufficient documentation

## 2022-01-16 DIAGNOSIS — Z79899 Other long term (current) drug therapy: Secondary | ICD-10-CM | POA: Insufficient documentation

## 2022-01-16 LAB — CMP (CANCER CENTER ONLY)
ALT: 10 U/L (ref 0–44)
AST: 19 U/L (ref 15–41)
Albumin: 4.3 g/dL (ref 3.5–5.0)
Alkaline Phosphatase: 51 U/L (ref 38–126)
Anion gap: 10 (ref 5–15)
BUN: 17 mg/dL (ref 6–20)
CO2: 27 mmol/L (ref 22–32)
Calcium: 9.4 mg/dL (ref 8.9–10.3)
Chloride: 103 mmol/L (ref 98–111)
Creatinine: 0.66 mg/dL (ref 0.44–1.00)
GFR, Estimated: >60 mL/min (ref >60)
Glucose, Bld: 90 mg/dL (ref 70–99)
Potassium: 4.1 mmol/L (ref 3.5–5.1)
Sodium: 140 mmol/L (ref 135–145)
Total Bilirubin: 0.6 mg/dL (ref 0.3–1.2)
Total Protein: 7.3 g/dL (ref 6.5–8.1)

## 2022-01-16 LAB — CBC WITH DIFFERENTIAL (CANCER CENTER ONLY)
Abs Immature Granulocytes: 0.01 10*3/uL (ref 0.00–0.07)
Basophils Absolute: 0 10*3/uL (ref 0.0–0.1)
Basophils Relative: 1 %
Eosinophils Absolute: 0.1 10*3/uL (ref 0.0–0.5)
Eosinophils Relative: 3 %
HCT: 43.8 % (ref 36.0–46.0)
Hemoglobin: 14.3 g/dL (ref 12.0–15.0)
Immature Granulocytes: 0 %
Lymphocytes Relative: 42 %
Lymphs Abs: 1.8 10*3/uL (ref 0.7–4.0)
MCH: 29.8 pg (ref 26.0–34.0)
MCHC: 32.6 g/dL (ref 30.0–36.0)
MCV: 91.3 fL (ref 80.0–100.0)
Monocytes Absolute: 0.3 10*3/uL (ref 0.1–1.0)
Monocytes Relative: 7 %
Neutro Abs: 2.1 10*3/uL (ref 1.7–7.7)
Neutrophils Relative %: 47 %
Platelet Count: 205 10*3/uL (ref 150–400)
RBC: 4.8 MIL/uL (ref 3.87–5.11)
RDW: 13 % (ref 11.5–15.5)
WBC Count: 4.4 10*3/uL (ref 4.0–10.5)
nRBC: 0 % (ref 0.0–0.2)

## 2022-01-16 MED ORDER — ONDANSETRON HCL 8 MG PO TABS: 8.0000 mg | ORAL_TABLET | Freq: Three times a day (TID) | ORAL | 1 refills | Status: DC | PRN

## 2022-01-16 MED ORDER — OCTREOTIDE ACETATE 30 MG IM KIT
30.0000 mg | PACK | Freq: Once | INTRAMUSCULAR | Status: AC
  Administered 2022-01-16: 30 mg via INTRAMUSCULAR
  Filled 2022-01-16: qty 1

## 2022-01-16 NOTE — Progress Notes (Signed)
Reports some mild nausea x 2 days on day 3 after last injection. Sent script for nausea for future prn need.

## 2022-01-17 LAB — CHROMOGRANIN A: Chromogranin A (ng/mL): 74.7 ng/mL (ref 0.0–101.8)

## 2022-02-15 ENCOUNTER — Inpatient Hospital Stay: Payer: BC Managed Care – PPO

## 2022-02-15 ENCOUNTER — Inpatient Hospital Stay: Payer: BC Managed Care – PPO | Attending: Oncology | Admitting: Oncology

## 2022-02-15 VITALS — BP 106/71 | HR 68 | Temp 98.1°F | Resp 18 | Ht 65.0 in | Wt 133.2 lb

## 2022-02-15 DIAGNOSIS — C7B02 Secondary carcinoid tumors of liver: Secondary | ICD-10-CM | POA: Diagnosis present

## 2022-02-15 DIAGNOSIS — C7A019 Malignant carcinoid tumor of the small intestine, unspecified portion: Secondary | ICD-10-CM

## 2022-02-15 DIAGNOSIS — C786 Secondary malignant neoplasm of retroperitoneum and peritoneum: Secondary | ICD-10-CM | POA: Diagnosis not present

## 2022-02-15 DIAGNOSIS — Z79899 Other long term (current) drug therapy: Secondary | ICD-10-CM | POA: Diagnosis not present

## 2022-02-15 DIAGNOSIS — C787 Secondary malignant neoplasm of liver and intrahepatic bile duct: Secondary | ICD-10-CM | POA: Insufficient documentation

## 2022-02-15 MED ORDER — OCTREOTIDE ACETATE 30 MG IM KIT
30.0000 mg | PACK | Freq: Once | INTRAMUSCULAR | Status: AC
  Administered 2022-02-15: 30 mg via INTRAMUSCULAR
  Filled 2022-02-15: qty 1

## 2022-02-15 NOTE — Progress Notes (Signed)
[facility] OFFICE PROGRESS NOTE   Diagnosis: Carcinoid tumor  INTERVAL HISTORY:   Brittney [name] returns as scheduled.  She continues monthly Sandostatin.  She had a few days of nausea last month.  No flushing or diarrhea.  No abdominal pain.  No bleeding.  She is exercising.  Objective:  Vital signs in last 24 hours:  Blood pressure 106/71, pulse 68, temperature 98.1 F (36.7 C), temperature source Oral, resp. rate 18, height 5' 5"$  (1.651 m), weight 133 lb 3.2 oz (60.4 kg), last menstrual period 01/31/2014, SpO2 100 %.     Resp: Lungs clear bilaterally Cardio: Regular rate and rhythm GI: No hepatosplenomegaly, no mass, nontender Vascular: No leg edema  Lab Results:  Lab Results  Component Value Date   WBC 4.4 01/16/2022   HGB 14.3 01/16/2022   HCT 43.8 01/16/2022   MCV 91.3 01/16/2022   PLT 205 01/16/2022   NEUTROABS 2.1 01/16/2022    CMP  Lab Results  Component Value Date   NA 140 01/16/2022   K 4.1 01/16/2022   CL 103 01/16/2022   CO2 27 01/16/2022   GLUCOSE 90 01/16/2022   BUN 17 01/16/2022   CREATININE 0.66 01/16/2022   CALCIUM 9.4 01/16/2022   PROT 7.3 01/16/2022   ALBUMIN 4.3 01/16/2022   AST 19 01/16/2022   ALT 10 01/16/2022   ALKPHOS 51 01/16/2022   BILITOT 0.6 01/16/2022   GFRNONAA >60 01/16/2022   GFRAA >60 01/06/2019    No results found for: "CEA1", "CEA", "CAN199", "CA125"  Lab Results  Component Value Date   INR 0.95 10/07/2016   LABPROT 12.6 10/07/2016    Imaging:  No results found.  Medications: I have reviewed the patient's current medications.   Assessment/Plan: Metastatic neuroendocrine tumor MRI of the abdomen 10/02/2016 confirmed multiple enhancing liver lesions consistent with metastases, no primary tumor site identified Ultrasound-guided biopsy of a left liver lesion 10/07/2016-neuroendocrine neoplasm, "intermediate grade ", review of pathology at GI tumor conference consistent with a low-grade carcinoid  tumor Elevated chromogranin A 10/15/2016  gallium-DOTATATE scan 10/23/2016-multiple foci of liver metastases, enlarged central mesenteric lymph nodes, short intussusception's in adjacent small bowel felt to represent a primary small bowel tumor, additional mesenteric lymph nodes with metabolic activity, deep right pelvic nodule consistent with a pelvic metastasis Small bowel resection 11/21/2016- mid ileum mass, low-grade neuroendocrine tumor,pT4,pN2, 6/18 lymph nodes positive, positive mesenteric resection margin (lymph node), intramural satellite nodule CT 11/30/2016-small bowel obstruction with transition point adjacent to suture line at the distal small bowel, liver metastases similar to the 10/02/2016 MRI Recurrent obstructive symptoms requiring repeat surgery with resection of the small bowel anastomosis site 12/13/2016, pathology negative for malignancy Initiation of monthly Sandostatin 12/20/2016 CT abdomen/pelvis 02/19/2017-mild enlargement of liver lesions compared to November 2018 Cycle 1 Lutathera 06/17/2017 Cycle 2 Lutathera 08/13/2017 Cycle 3 Lutathera 10/07/2017 Cycle 4 Lutathera 12/02/2017 Dotatate scan 12/29/2017-no evidence of new or metastatic disease or progression.  Multiple hepatic metastasis.  Lesions have decreased in size from the most recent CT scan and slightly increased in radiotracer activity from dotatate PET scan 10/23/2016.  Interval resection of small bowel tumor and bulky mesenteric metastasis.  No residual activity in the bowel or mesentery.  Single small focus of activity in the deep right pelvis along the peritoneal surface not changed from comparison exam. Monthly Sandostatin continued Netspot 06/23/2018- mild decrease in radiotracer activity of all hepatic metastases, no new lesions, slight enlargement of several liver lesions Monthly Sandostatin continued Netspot 03/31/2019-no new liver lesions,  no change in the size of liver lesions with a mild decrease in tracer  activity involving several lesions, stable right pelvic peritoneal implant Monthly Sandostatin continued Netspot 12/07/2019-no new liver lesion, focus of activity left facet joint at C4-C5 has increased, intense activity associated with fractures of the left ninth and 10th ribs, unchanged size and number of liver lesions with increase in radiotracer activity-potentially indicating mild progression Netspot 08/14/2020-decrease in radiotracer activity associated with liver lesions, no new lesions, the liver lesions have not enlarged, decreased radiotracer activity associated with a small right pelvic peritoneal implant, decreased activity associated with degenerative changes in the cervical and thoracic spine, no evidence of progressive disease Monthly Sandostatin continued Netspot 05/28/2021-stable multifocal radiotracer avid liver metastases, no increase in size, no new liver lesions, stable small deep right pelvic implant, no evidence of progressive disease Monthly Sandostatin continued   2.  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  Intermittent abdominal bloating and left-sided abdominal pain- resolved   4.  Admission 11/30/2016 with a small bowel obstruction     Disposition: [name] appears stable.  The chromogranin a level and liver panel were normal last month.  She will continue monthly Sandostatin.  She will be scheduled for a restaging dotatate PET and office visit on 05/10/2022.  Brittney Coder, MD  02/15/2022  10:35 AM

## 2022-02-17 ENCOUNTER — Encounter: Payer: Self-pay | Admitting: Oncology

## 2022-03-14 ENCOUNTER — Inpatient Hospital Stay: Payer: BC Managed Care – PPO | Attending: Oncology

## 2022-03-14 VITALS — BP 97/70 | HR 66 | Temp 98.2°F | Resp 20

## 2022-03-14 DIAGNOSIS — Z79899 Other long term (current) drug therapy: Secondary | ICD-10-CM | POA: Insufficient documentation

## 2022-03-14 DIAGNOSIS — C7A019 Malignant carcinoid tumor of the small intestine, unspecified portion: Secondary | ICD-10-CM

## 2022-03-14 DIAGNOSIS — C7B02 Secondary carcinoid tumors of liver: Secondary | ICD-10-CM | POA: Insufficient documentation

## 2022-03-14 MED ORDER — OCTREOTIDE ACETATE 30 MG IM KIT
30.0000 mg | PACK | Freq: Once | INTRAMUSCULAR | Status: AC
  Administered 2022-03-14: 30 mg via INTRAMUSCULAR
  Filled 2022-03-14: qty 1

## 2022-04-08 ENCOUNTER — Encounter: Payer: Self-pay | Admitting: Oncology

## 2022-04-10 ENCOUNTER — Ambulatory Visit: Payer: BC Managed Care – PPO | Admitting: Oncology

## 2022-04-12 ENCOUNTER — Inpatient Hospital Stay: Payer: BC Managed Care – PPO | Attending: Oncology

## 2022-04-12 ENCOUNTER — Inpatient Hospital Stay: Payer: BC Managed Care – PPO

## 2022-04-12 VITALS — BP 99/69 | HR 59 | Temp 98.2°F | Resp 16

## 2022-04-12 DIAGNOSIS — C7A019 Malignant carcinoid tumor of the small intestine, unspecified portion: Secondary | ICD-10-CM

## 2022-04-12 DIAGNOSIS — C7B02 Secondary carcinoid tumors of liver: Secondary | ICD-10-CM | POA: Diagnosis present

## 2022-04-12 DIAGNOSIS — Z79899 Other long term (current) drug therapy: Secondary | ICD-10-CM | POA: Insufficient documentation

## 2022-04-12 LAB — CMP (CANCER CENTER ONLY)
ALT: 14 U/L (ref 0–44)
AST: 24 U/L (ref 15–41)
Albumin: 4.3 g/dL (ref 3.5–5.0)
Alkaline Phosphatase: 59 U/L (ref 38–126)
Anion gap: 7 (ref 5–15)
BUN: 23 mg/dL — ABNORMAL HIGH (ref 6–20)
CO2: 31 mmol/L (ref 22–32)
Calcium: 9.8 mg/dL (ref 8.9–10.3)
Chloride: 103 mmol/L (ref 98–111)
Creatinine: 0.7 mg/dL (ref 0.44–1.00)
GFR, Estimated: >60 mL/min (ref >60)
Glucose, Bld: 98 mg/dL (ref 70–99)
Potassium: 4.1 mmol/L (ref 3.5–5.1)
Sodium: 141 mmol/L (ref 135–145)
Total Bilirubin: 0.4 mg/dL (ref 0.3–1.2)
Total Protein: 7.5 g/dL (ref 6.5–8.1)

## 2022-04-12 LAB — CBC WITH DIFFERENTIAL (CANCER CENTER ONLY)
Abs Immature Granulocytes: 0.01 10*3/uL (ref 0.00–0.07)
Basophils Absolute: 0 10*3/uL (ref 0.0–0.1)
Basophils Relative: 1 %
Eosinophils Absolute: 0.2 10*3/uL (ref 0.0–0.5)
Eosinophils Relative: 4 %
HCT: 43.8 % (ref 36.0–46.0)
Hemoglobin: 14.4 g/dL (ref 12.0–15.0)
Immature Granulocytes: 0 %
Lymphocytes Relative: 35 %
Lymphs Abs: 1.7 10*3/uL (ref 0.7–4.0)
MCH: 29.9 pg (ref 26.0–34.0)
MCHC: 32.9 g/dL (ref 30.0–36.0)
MCV: 90.9 fL (ref 80.0–100.0)
Monocytes Absolute: 0.3 10*3/uL (ref 0.1–1.0)
Monocytes Relative: 6 %
Neutro Abs: 2.6 10*3/uL (ref 1.7–7.7)
Neutrophils Relative %: 54 %
Platelet Count: 198 10*3/uL (ref 150–400)
RBC: 4.82 MIL/uL (ref 3.87–5.11)
RDW: 12.5 % (ref 11.5–15.5)
WBC Count: 4.8 10*3/uL (ref 4.0–10.5)
nRBC: 0 % (ref 0.0–0.2)

## 2022-04-12 MED ORDER — OCTREOTIDE ACETATE 30 MG IM KIT
30.0000 mg | PACK | Freq: Once | INTRAMUSCULAR | Status: AC
  Administered 2022-04-12: 30 mg via INTRAMUSCULAR
  Filled 2022-04-12: qty 1

## 2022-04-15 ENCOUNTER — Encounter: Payer: Self-pay | Admitting: Oncology

## 2022-04-15 LAB — CHROMOGRANIN A: Chromogranin A (ng/mL): 98.1 ng/mL (ref 0.0–101.8)

## 2022-05-10 ENCOUNTER — Encounter (HOSPITAL_COMMUNITY)
Admission: RE | Admit: 2022-05-10 | Discharge: 2022-05-10 | Disposition: A | Discharge status: Home / Self Care | Payer: BC Managed Care – PPO | Source: Ambulatory Visit | Attending: Oncology | Admitting: Oncology

## 2022-05-10 ENCOUNTER — Inpatient Hospital Stay (HOSPITAL_BASED_OUTPATIENT_CLINIC_OR_DEPARTMENT_OTHER): Payer: BC Managed Care – PPO | Admitting: Oncology

## 2022-05-10 ENCOUNTER — Inpatient Hospital Stay: Payer: BC Managed Care – PPO | Attending: Oncology

## 2022-05-10 VITALS — BP 104/76 | HR 60 | Temp 98.1°F | Resp 18 | Ht 65.0 in | Wt 127.6 lb

## 2022-05-10 DIAGNOSIS — C7A019 Malignant carcinoid tumor of the small intestine, unspecified portion: Secondary | ICD-10-CM | POA: Insufficient documentation

## 2022-05-10 DIAGNOSIS — Z79899 Other long term (current) drug therapy: Secondary | ICD-10-CM | POA: Insufficient documentation

## 2022-05-10 DIAGNOSIS — D509 Iron deficiency anemia, unspecified: Secondary | ICD-10-CM | POA: Insufficient documentation

## 2022-05-10 DIAGNOSIS — C7B02 Secondary carcinoid tumors of liver: Secondary | ICD-10-CM | POA: Insufficient documentation

## 2022-05-10 MED ORDER — COPPER CU 64 DOTATATE 1 MCI/ML IV SOLN
4.0000 | Freq: Once | INTRAVENOUS | Status: AC
  Administered 2022-05-10: 3.98 via INTRAVENOUS

## 2022-05-10 MED ORDER — OCTREOTIDE ACETATE 30 MG IM KIT
30.0000 mg | PACK | Freq: Once | INTRAMUSCULAR | Status: AC
  Administered 2022-05-10: 30 mg via INTRAMUSCULAR
  Filled 2022-05-10: qty 1

## 2022-05-10 NOTE — Progress Notes (Signed)
[facility] OFFICE PROGRESS NOTE   Diagnosis: Carcinoid tumor  INTERVAL HISTORY:   Brittney [name] returns as scheduled.  She feels well.  She is exercising.  She has irregular bowel habits, but no consistent diarrhea.  She was recently treated by her gynecologist for a urinary tract infection. She reports not feeling well for 1 to 2 days following each Sandostatin injection. Objective:  Vital signs in last 24 hours:  Blood pressure 104/76, pulse 60, temperature 98.1 F (36.7 C), temperature source Oral, resp. rate 18, height 5\' 5"  (1.651 m), weight 127 lb 9.6 oz (57.9 kg), last menstrual period 01/31/2014, SpO2 100 %.    Lymphatics: No cervical or supraclavicular nodes Resp: Lungs clear bilaterally Cardio: Regular rate and rhythm GI: No mass, nontender, no hepatosplenomegaly Vascular: No leg edema   Lab Results:  Lab Results  Component Value Date   WBC 4.8 04/12/2022   HGB 14.4 04/12/2022   HCT 43.8 04/12/2022   MCV 90.9 04/12/2022   PLT 198 04/12/2022   NEUTROABS 2.6 04/12/2022    CMP  Lab Results  Component Value Date   NA 141 04/12/2022   K 4.1 04/12/2022   CL 103 04/12/2022   CO2 31 04/12/2022   GLUCOSE 98 04/12/2022   BUN 23 (H) 04/12/2022   CREATININE 0.70 04/12/2022   CALCIUM 9.8 04/12/2022   PROT 7.5 04/12/2022   ALBUMIN 4.3 04/12/2022   AST 24 04/12/2022   ALT 14 04/12/2022   ALKPHOS 59 04/12/2022   BILITOT 0.4 04/12/2022   GFRNONAA >60 04/12/2022   GFRAA >60 01/06/2019     Medications: I have reviewed the patient's current medications.   Assessment/Plan: Metastatic neuroendocrine tumor MRI of the abdomen 10/02/2016 confirmed multiple enhancing liver lesions consistent with metastases, no primary tumor site identified Ultrasound-guided biopsy of a left liver lesion 10/07/2016-neuroendocrine neoplasm, "intermediate grade ", review of pathology at GI tumor conference consistent with a low-grade carcinoid tumor Elevated chromogranin  A 10/15/2016  gallium-DOTATATE scan 10/23/2016-multiple foci of liver metastases, enlarged central mesenteric lymph nodes, short intussusception's in adjacent small bowel felt to represent a primary small bowel tumor, additional mesenteric lymph nodes with metabolic activity, deep right pelvic nodule consistent with a pelvic metastasis Small bowel resection 11/21/2016- mid ileum mass, low-grade neuroendocrine tumor,pT4,pN2, 6/18 lymph nodes positive, positive mesenteric resection margin (lymph node), intramural satellite nodule CT 11/30/2016-small bowel obstruction with transition point adjacent to suture line at the distal small bowel, liver metastases similar to the 10/02/2016 MRI Recurrent obstructive symptoms requiring repeat surgery with resection of the small bowel anastomosis site 12/13/2016, pathology negative for malignancy Initiation of monthly Sandostatin 12/20/2016 CT abdomen/pelvis 02/19/2017-mild enlargement of liver lesions compared to November 2018 Cycle 1 Lutathera 06/17/2017 Cycle 2 Lutathera 08/13/2017 Cycle 3 Lutathera 10/07/2017 Cycle 4 Lutathera 12/02/2017 Dotatate scan 12/29/2017-no evidence of new or metastatic disease or progression.  Multiple hepatic metastasis.  Lesions have decreased in size from the most recent CT scan and slightly increased in radiotracer activity from dotatate PET scan 10/23/2016.  Interval resection of small bowel tumor and bulky mesenteric metastasis.  No residual activity in the bowel or mesentery.  Single small focus of activity in the deep right pelvis along the peritoneal surface not changed from comparison exam. Monthly Sandostatin continued Netspot 06/23/2018- mild decrease in radiotracer activity of all hepatic metastases, no new lesions, slight enlargement of several liver lesions Monthly Sandostatin continued Netspot 03/31/2019-no new liver lesions, no change in the size of liver lesions with a mild decrease in  tracer activity involving several  lesions, stable right pelvic peritoneal implant Monthly Sandostatin continued Netspot 12/07/2019-no new liver lesion, focus of activity left facet joint at C4-C5 has increased, intense activity associated with fractures of the left ninth and 10th ribs, unchanged size and number of liver lesions with increase in radiotracer activity-potentially indicating mild progression Netspot 08/14/2020-decrease in radiotracer activity associated with liver lesions, no new lesions, the liver lesions have not enlarged, decreased radiotracer activity associated with a small right pelvic peritoneal implant, decreased activity associated with degenerative changes in the cervical and thoracic spine, no evidence of progressive disease Monthly Sandostatin continued Netspot 05/28/2021-stable multifocal radiotracer avid liver metastases, no increase in size, no new liver lesions, stable small deep right pelvic implant, no evidence of progressive disease Monthly Sandostatin continued   2.  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  Intermittent abdominal bloating and left-sided abdominal pain- resolved   4.  Admission 11/30/2016 with a small bowel obstruction      Disposition: Brittney [name] appears unchanged.  There is no clinical evidence for progression of the metastatic carcinoid tumor.  She had a dotatate PET scan today.  I reviewed the images with Brittney [name] and her husband.  There are no apparent new lesions.  Liver lesions appear unchanged in size on the noncontrast CT images.  We will wait on the final reading from radiology.  The plan is to continue monthly Sandostatin.  She will return for an office visit in approximately 3 months.  She will have a repeat chromogranin a level when she is here in June.  Thornton Papas, MD  05/10/2022  10:33 AM

## 2022-05-14 ENCOUNTER — Telehealth: Payer: Self-pay | Admitting: *Deleted

## 2022-05-14 NOTE — Telephone Encounter (Signed)
Notified of stable PET Dotate results and f/u as scheduled.

## 2022-05-31 ENCOUNTER — Encounter: Payer: Self-pay | Admitting: Oncology

## 2022-05-31 ENCOUNTER — Telehealth: Payer: Self-pay | Admitting: Oncology

## 2022-05-31 NOTE — Telephone Encounter (Signed)
Left patient a vm regarding upcoming appointment change  

## 2022-06-07 ENCOUNTER — Inpatient Hospital Stay: Payer: BC Managed Care – PPO

## 2022-06-07 VITALS — BP 95/64 | HR 59 | Temp 97.8°F | Resp 16

## 2022-06-07 DIAGNOSIS — C7B02 Secondary carcinoid tumors of liver: Secondary | ICD-10-CM | POA: Insufficient documentation

## 2022-06-07 DIAGNOSIS — C7A019 Malignant carcinoid tumor of the small intestine, unspecified portion: Secondary | ICD-10-CM | POA: Diagnosis not present

## 2022-06-07 DIAGNOSIS — D509 Iron deficiency anemia, unspecified: Secondary | ICD-10-CM | POA: Diagnosis not present

## 2022-06-07 DIAGNOSIS — Z79899 Other long term (current) drug therapy: Secondary | ICD-10-CM | POA: Insufficient documentation

## 2022-06-07 MED ORDER — OCTREOTIDE ACETATE 30 MG IM KIT
30.0000 mg | PACK | Freq: Once | INTRAMUSCULAR | Status: AC
  Administered 2022-06-07: 30 mg via INTRAMUSCULAR
  Filled 2022-06-07: qty 1

## 2022-07-03 ENCOUNTER — Inpatient Hospital Stay: Payer: BC Managed Care – PPO

## 2022-07-03 ENCOUNTER — Inpatient Hospital Stay: Payer: BC Managed Care – PPO | Attending: Oncology

## 2022-07-03 VITALS — BP 99/73 | HR 57 | Temp 97.5°F | Resp 16

## 2022-07-03 DIAGNOSIS — C7A019 Malignant carcinoid tumor of the small intestine, unspecified portion: Secondary | ICD-10-CM | POA: Diagnosis not present

## 2022-07-03 DIAGNOSIS — C7B02 Secondary carcinoid tumors of liver: Secondary | ICD-10-CM | POA: Insufficient documentation

## 2022-07-03 DIAGNOSIS — Z79899 Other long term (current) drug therapy: Secondary | ICD-10-CM | POA: Insufficient documentation

## 2022-07-03 MED ORDER — OCTREOTIDE ACETATE 30 MG IM KIT
30.0000 mg | PACK | Freq: Once | INTRAMUSCULAR | Status: AC
  Administered 2022-07-03: 30 mg via INTRAMUSCULAR

## 2022-07-03 NOTE — Patient Instructions (Signed)
Octreotide Injection Solution What is this medication? OCTREOTIDE (ok TREE oh tide) treats high levels of growth hormone (acromegaly). It works by reducing the amount of growth hormone your body makes. This reduces symptoms and the risk of health problems caused by too much growth hormone, such as diabetes and heart disease. It may also be used to treat diarrhea caused by neuroendocrine tumors. It works by slowing down the release of serotonin from the tumor cells. This reduces the number of bowel movements you have. This medicine may be used for other purposes; ask your health care provider or pharmacist if you have questions. COMMON BRAND NAME(S): Bynfezia, Sandostatin What should I tell my care team before I take this medication? They need to know if you have any of these conditions: Diabetes Gallbladder disease Kidney disease Liver disease Thyroid disease An unusual or allergic reaction to octreotide, other medications, foods, dyes, or preservatives Pregnant or trying to get pregnant Breast-feeding How should I use this medication? This medication is injected under the skin or into a vein. It is usually given by your care team in a hospital or clinic setting. If you get this medication at home, you will be taught how to prepare and give it. Use exactly as directed. Take it as directed on the prescription label at the same time every day. Keep taking it unless your care team tells you to stop. Allow the injection solution to come to room temperature before use. Do not warm it artificially. It is important that you put your used needles and syringes in a special sharps container. Do not put them in a trash can. If you do not have a sharps container, call your pharmacist or care team to get one. Talk to your care team about the use of this medication in children. Special care may be needed. Overdosage: If you think you have taken too much of this medicine contact a poison control center or  emergency room at once. NOTE: This medicine is only for you. Do not share this medicine with others. What if I miss a dose? If you miss a dose, take it as soon as you can. If it is almost time for your next dose, take only that dose. Do not take double or extra doses. What may interact with this medication? Bromocriptine Certain medications for blood pressure, heart disease, irregular heartbeat Cyclosporine Diuretics Medications for diabetes, including insulin Quinidine This list may not describe all possible interactions. Give your health care provider a list of all the medicines, herbs, non-prescription drugs, or dietary supplements you use. Also tell them if you smoke, drink alcohol, or use illegal drugs. Some items may interact with your medicine. What should I watch for while using this medication? Visit your care team for regular checks on your progress. Tell your care team if your symptoms do not start to get better or if they get worse. To help reduce irritation at the injection site, use a different site for each injection and make sure the solution is at room temperature before use. This medication may cause decreases in blood sugar. Signs of low blood sugar include chills, cool, pale skin or cold sweats, drowsiness, extreme hunger, fast heartbeat, headache, nausea, nervousness or anxiety, shakiness, trembling, unsteadiness, tiredness, or weakness. Contact your care team right away if you experience any of these symptoms. This medication may increase blood sugar. The risk may be higher in patients who already have diabetes. Ask your care team what you can do to lower your   risk of diabetes while taking this medication. You should make sure you get enough vitamin B12 while you are taking this medication. Discuss the foods you eat and the vitamins you take with your care team. What side effects may I notice from receiving this medication? Side effects that you should report to your care  team as soon as possible: Allergic reactions--skin rash, itching, hives, swelling of the face, lips, tongue, or throat Gallbladder problems--severe stomach pain, nausea, vomiting, fever Heart rhythm changes--fast or irregular heartbeat, dizziness, feeling faint or lightheaded, chest pain, trouble breathing High blood sugar (hyperglycemia)--increased thirst or amount of urine, unusual weakness or fatigue, blurry vision Low blood sugar (hypoglycemia)--tremors or shaking, anxiety, sweating, cold or clammy skin, confusion, dizziness, rapid heartbeat Low thyroid levels (hypothyroidism)--unusual weakness or fatigue, increased sensitivity to cold, constipation, hair loss, dry skin, weight gain, feelings of depression Low vitamin B12 level--pain, tingling, or numbness in the hands or feet, muscle weakness, dizziness, confusion, trouble concentrating Pancreatitis--severe stomach pain that spreads to your back or gets worse after eating or when touched, fever, nausea, vomiting Side effects that usually do not require medical attention (report to your care team if they continue or are bothersome): Diarrhea Dizziness Gas Headache Pain, redness, or irritation at injection site Stomach pain This list may not describe all possible side effects. Call your doctor for medical advice about side effects. You may report side effects to FDA at 1-800-FDA-1088. Where should I keep my medication? Keep out of the reach of children and pets. Store in the refrigerator. Protect from light. Allow to come to room temperature naturally. Do not use artificial heat. If protected from light, the injection may be stored between 20 and 30 degrees C (70 and 86 degrees F) for 14 days. After the initial use, throw away any unused portion of a multiple dose vial after 14 days. Get rid of any unused portions of the ampules after use. To get rid of medications that are no longer needed or have expired: Take the medication to a medication  take-back program. Ask your pharmacy or law enforcement to find a location. If you cannot return the medication, ask your pharmacist or care team how to get rid of the medication safely. NOTE: This sheet is a summary. It may not cover all possible information. If you have questions about this medicine, talk to your doctor, pharmacist, or health care provider.  2024 Elsevier/Gold Standard (2021-03-29 00:00:00)

## 2022-07-04 ENCOUNTER — Other Ambulatory Visit: Payer: BC Managed Care – PPO

## 2022-07-04 ENCOUNTER — Ambulatory Visit: Payer: BC Managed Care – PPO

## 2022-07-04 LAB — CHROMOGRANIN A: Chromogranin A (ng/mL): 71 ng/mL (ref 0.0–101.8)

## 2022-07-05 ENCOUNTER — Other Ambulatory Visit: Payer: BC Managed Care – PPO

## 2022-07-05 ENCOUNTER — Ambulatory Visit: Payer: BC Managed Care – PPO

## 2022-08-02 ENCOUNTER — Inpatient Hospital Stay: Payer: BC Managed Care – PPO

## 2022-08-02 ENCOUNTER — Inpatient Hospital Stay: Payer: BC Managed Care – PPO | Attending: Oncology | Admitting: Oncology

## 2022-08-02 VITALS — BP 106/66 | HR 77 | Temp 98.1°F | Resp 18 | Ht 65.0 in | Wt 128.0 lb

## 2022-08-02 DIAGNOSIS — C7B02 Secondary carcinoid tumors of liver: Secondary | ICD-10-CM | POA: Insufficient documentation

## 2022-08-02 DIAGNOSIS — C7A019 Malignant carcinoid tumor of the small intestine, unspecified portion: Secondary | ICD-10-CM | POA: Diagnosis present

## 2022-08-02 DIAGNOSIS — Z79899 Other long term (current) drug therapy: Secondary | ICD-10-CM | POA: Diagnosis not present

## 2022-08-02 MED ORDER — OCTREOTIDE ACETATE 30 MG IM KIT
30.0000 mg | PACK | Freq: Once | INTRAMUSCULAR | Status: AC
  Administered 2022-08-02: 30 mg via INTRAMUSCULAR
  Filled 2022-08-02: qty 1

## 2022-08-02 NOTE — Progress Notes (Signed)
Brittney [name]   Diagnosis: Carcinoid tumor  INTERVAL HISTORY:   Brittney [name] returns as scheduled.  She feels well.  She continues monthly Sandostatin.  No diarrhea or constipation.  She is exercising.  No complaint.  Objective:  Vital signs in last 24 hours:  Blood pressure 106/66, pulse 77, temperature 98.1 F (36.7 C), temperature source Oral, resp. rate 18, height 5\' 5"  (1.651 m), weight 128 lb (58.1 kg), last menstrual period 01/31/2014, SpO2 100%.    Resp: Lungs clear bilaterally Cardio: Regular rate and rhythm GI: Nontender, no mass, no hepatosplenomegaly, no apparent ascites Vascular: No leg edema  Lab Results:  Lab Results  Component Value Date   WBC 4.8 04/12/2022   HGB 14.4 04/12/2022   HCT 43.8 04/12/2022   MCV 90.9 04/12/2022   PLT 198 04/12/2022   NEUTROABS 2.6 04/12/2022    CMP  Lab Results  Component Value Date   NA 141 04/12/2022   K 4.1 04/12/2022   CL 103 04/12/2022   CO2 31 04/12/2022   GLUCOSE 98 04/12/2022   BUN 23 (H) 04/12/2022   CREATININE 0.70 04/12/2022   CALCIUM 9.8 04/12/2022   PROT 7.5 04/12/2022   ALBUMIN 4.3 04/12/2022   AST 24 04/12/2022   ALT 14 04/12/2022   ALKPHOS 59 04/12/2022   BILITOT 0.4 04/12/2022   GFRNONAA >60 04/12/2022   GFRAA >60 01/06/2019    Medications: I have reviewed the patient's current medications.   Assessment/Plan:  Metastatic neuroendocrine tumor MRI of the abdomen 10/02/2016 confirmed multiple enhancing liver lesions consistent with metastases, no primary tumor site identified Ultrasound-guided biopsy of a left liver lesion 10/07/2016-neuroendocrine neoplasm, "intermediate grade ", review of pathology at GI tumor conference consistent with a low-grade carcinoid tumor Elevated chromogranin A 10/15/2016  gallium-DOTATATE scan 10/23/2016-multiple foci of liver metastases, enlarged central mesenteric lymph nodes, short intussusception's in adjacent small bowel felt  to represent a primary small bowel tumor, additional mesenteric lymph nodes with metabolic activity, deep right pelvic nodule consistent with a pelvic metastasis Small bowel resection 11/21/2016- mid ileum mass, low-grade neuroendocrine tumor,pT4,pN2, 6/18 lymph nodes positive, positive mesenteric resection margin (lymph node), intramural satellite nodule CT 11/30/2016-small bowel obstruction with transition point adjacent to suture line at the distal small bowel, liver metastases similar to the 10/02/2016 MRI Recurrent obstructive symptoms requiring repeat surgery with resection of the small bowel anastomosis site 12/13/2016, pathology negative for malignancy Initiation of monthly Sandostatin 12/20/2016 CT abdomen/pelvis 02/19/2017-mild enlargement of liver lesions compared to November 2018 Cycle 1 Lutathera 06/17/2017 Cycle 2 Lutathera 08/13/2017 Cycle 3 Lutathera 10/07/2017 Cycle 4 Lutathera 12/02/2017 Dotatate scan 12/29/2017-no evidence of new or metastatic disease or progression.  Multiple hepatic metastasis.  Lesions have decreased in size from the most recent CT scan and slightly increased in radiotracer activity from dotatate PET scan 10/23/2016.  Interval resection of small bowel tumor and bulky mesenteric metastasis.  No residual activity in the bowel or mesentery.  Single small focus of activity in the deep right pelvis along the peritoneal surface not changed from comparison exam. Monthly Sandostatin continued Netspot 06/23/2018- mild decrease in radiotracer activity of all hepatic metastases, no new lesions, slight enlargement of several liver lesions Monthly Sandostatin continued Netspot 03/31/2019-no new liver lesions, no change in the size of liver lesions with a mild decrease in tracer activity involving several lesions, stable right pelvic peritoneal implant Monthly Sandostatin continued Netspot 12/07/2019-no new liver lesion, focus of activity left facet joint at C4-C5 has increased,  intense  activity associated with fractures of the left ninth and 10th ribs, unchanged size and number of liver lesions with increase in radiotracer activity-potentially indicating mild progression Netspot 08/14/2020-decrease in radiotracer activity associated with liver lesions, no new lesions, the liver lesions have not enlarged, decreased radiotracer activity associated with a small right pelvic peritoneal implant, decreased activity associated with degenerative changes in the cervical and thoracic spine, no evidence of progressive disease Monthly Sandostatin continued Netspot 05/28/2021-stable multifocal radiotracer avid liver metastases, no increase in size, no new liver lesions, stable small deep right pelvic implant, no evidence of progressive disease Monthly Sandostatin continued Netspot 05/10/2022-no change in size or radiotracer activity associated with liver metastases, no new lesion, stable small peritoneal lesion in the right pelvis   2.  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  Intermittent abdominal bloating and left-sided abdominal pain- resolved   4.  Admission 11/30/2016 with a small bowel obstruction      Disposition: Brittney [name] appears stable.  She is asymptomatic from the carcinoid tumor.  She continues monthly Sandostatin.  The chromogranin a level was normal in June.  She will return for an office visit in November.  Thornton Papas, MD  08/02/2022  10:23 AM

## 2022-08-05 ENCOUNTER — Encounter: Payer: Self-pay | Admitting: Oncology

## 2022-08-30 ENCOUNTER — Ambulatory Visit: Payer: BC Managed Care – PPO

## 2022-08-30 ENCOUNTER — Inpatient Hospital Stay: Payer: BC Managed Care – PPO | Attending: Oncology

## 2022-08-30 VITALS — BP 101/77 | HR 52 | Temp 97.6°F | Resp 18

## 2022-08-30 DIAGNOSIS — C7B02 Secondary carcinoid tumors of liver: Secondary | ICD-10-CM | POA: Diagnosis present

## 2022-08-30 DIAGNOSIS — C7A019 Malignant carcinoid tumor of the small intestine, unspecified portion: Secondary | ICD-10-CM | POA: Diagnosis present

## 2022-08-30 DIAGNOSIS — Z79899 Other long term (current) drug therapy: Secondary | ICD-10-CM | POA: Diagnosis not present

## 2022-08-30 MED ORDER — OCTREOTIDE ACETATE 30 MG IM KIT
30.0000 mg | PACK | Freq: Once | INTRAMUSCULAR | Status: AC
  Administered 2022-08-30: 30 mg via INTRAMUSCULAR
  Filled 2022-08-30: qty 1

## 2022-08-30 NOTE — Patient Instructions (Signed)
Octreotide Injection Solution What is this medication? OCTREOTIDE (ok TREE oh tide) treats high levels of growth hormone (acromegaly). It works by reducing the amount of growth hormone your body makes. This reduces symptoms and the risk of health problems caused by too much growth hormone, such as diabetes and heart disease. It may also be used to treat diarrhea caused by neuroendocrine tumors. It works by slowing down the release of serotonin from the tumor cells. This reduces the number of bowel movements you have. This medicine may be used for other purposes; ask your health care provider or pharmacist if you have questions. COMMON BRAND NAME(S): Bynfezia, Sandostatin What should I tell my care team before I take this medication? They need to know if you have any of these conditions: Diabetes Gallbladder disease Kidney disease Liver disease Thyroid disease An unusual or allergic reaction to octreotide, other medications, foods, dyes, or preservatives Pregnant or trying to get pregnant Breast-feeding How should I use this medication? This medication is injected under the skin or into a vein. It is usually given by your care team in a hospital or clinic setting. If you get this medication at home, you will be taught how to prepare and give it. Use exactly as directed. Take it as directed on the prescription label at the same time every day. Keep taking it unless your care team tells you to stop. Allow the injection solution to come to room temperature before use. Do not warm it artificially. It is important that you put your used needles and syringes in a special sharps container. Do not put them in a trash can. If you do not have a sharps container, call your pharmacist or care team to get one. Talk to your care team about the use of this medication in children. Special care may be needed. Overdosage: If you think you have taken too much of this medicine contact a poison control center or  emergency room at once. NOTE: This medicine is only for you. Do not share this medicine with others. What if I miss a dose? If you miss a dose, take it as soon as you can. If it is almost time for your next dose, take only that dose. Do not take double or extra doses. What may interact with this medication? Bromocriptine Certain medications for blood pressure, heart disease, irregular heartbeat Cyclosporine Diuretics Medications for diabetes, including insulin Quinidine This list may not describe all possible interactions. Give your health care provider a list of all the medicines, herbs, non-prescription drugs, or dietary supplements you use. Also tell them if you smoke, drink alcohol, or use illegal drugs. Some items may interact with your medicine. What should I watch for while using this medication? Visit your care team for regular checks on your progress. Tell your care team if your symptoms do not start to get better or if they get worse. To help reduce irritation at the injection site, use a different site for each injection and make sure the solution is at room temperature before use. This medication may cause decreases in blood sugar. Signs of low blood sugar include chills, cool, pale skin or cold sweats, drowsiness, extreme hunger, fast heartbeat, headache, nausea, nervousness or anxiety, shakiness, trembling, unsteadiness, tiredness, or weakness. Contact your care team right away if you experience any of these symptoms. This medication may increase blood sugar. The risk may be higher in patients who already have diabetes. Ask your care team what you can do to lower your   risk of diabetes while taking this medication. You should make sure you get enough vitamin B12 while you are taking this medication. Discuss the foods you eat and the vitamins you take with your care team. What side effects may I notice from receiving this medication? Side effects that you should report to your care  team as soon as possible: Allergic reactions--skin rash, itching, hives, swelling of the face, lips, tongue, or throat Gallbladder problems--severe stomach pain, nausea, vomiting, fever Heart rhythm changes--fast or irregular heartbeat, dizziness, feeling faint or lightheaded, chest pain, trouble breathing High blood sugar (hyperglycemia)--increased thirst or amount of urine, unusual weakness or fatigue, blurry vision Low blood sugar (hypoglycemia)--tremors or shaking, anxiety, sweating, cold or clammy skin, confusion, dizziness, rapid heartbeat Low thyroid levels (hypothyroidism)--unusual weakness or fatigue, increased sensitivity to cold, constipation, hair loss, dry skin, weight gain, feelings of depression Low vitamin B12 level--pain, tingling, or numbness in the hands or feet, muscle weakness, dizziness, confusion, trouble concentrating Pancreatitis--severe stomach pain that spreads to your back or gets worse after eating or when touched, fever, nausea, vomiting Side effects that usually do not require medical attention (report to your care team if they continue or are bothersome): Diarrhea Dizziness Gas Headache Pain, redness, or irritation at injection site Stomach pain This list may not describe all possible side effects. Call your doctor for medical advice about side effects. You may report side effects to FDA at 1-800-FDA-1088. Where should I keep my medication? Keep out of the reach of children and pets. Store in the refrigerator. Protect from light. Allow to come to room temperature naturally. Do not use artificial heat. If protected from light, the injection may be stored between 20 and 30 degrees C (70 and 86 degrees F) for 14 days. After the initial use, throw away any unused portion of a multiple dose vial after 14 days. Get rid of any unused portions of the ampules after use. To get rid of medications that are no longer needed or have expired: Take the medication to a medication  take-back program. Ask your pharmacy or law enforcement to find a location. If you cannot return the medication, ask your pharmacist or care team how to get rid of the medication safely. NOTE: This sheet is a summary. It may not cover all possible information. If you have questions about this medicine, talk to your doctor, pharmacist, or health care provider.  2024 Elsevier/Gold Standard (2021-03-29 00:00:00)  

## 2022-09-27 ENCOUNTER — Inpatient Hospital Stay: Payer: BC Managed Care – PPO

## 2022-09-30 ENCOUNTER — Inpatient Hospital Stay: Payer: BC Managed Care – PPO | Attending: Oncology

## 2022-09-30 VITALS — BP 91/65 | HR 54 | Temp 97.7°F | Resp 16

## 2022-09-30 DIAGNOSIS — C7B02 Secondary carcinoid tumors of liver: Secondary | ICD-10-CM | POA: Insufficient documentation

## 2022-09-30 DIAGNOSIS — C7A019 Malignant carcinoid tumor of the small intestine, unspecified portion: Secondary | ICD-10-CM | POA: Insufficient documentation

## 2022-09-30 DIAGNOSIS — Z79899 Other long term (current) drug therapy: Secondary | ICD-10-CM | POA: Insufficient documentation

## 2022-09-30 MED ORDER — OCTREOTIDE ACETATE 30 MG IM KIT
30.0000 mg | PACK | Freq: Once | INTRAMUSCULAR | Status: AC
  Administered 2022-09-30: 30 mg via INTRAMUSCULAR

## 2022-10-25 ENCOUNTER — Inpatient Hospital Stay: Payer: BC Managed Care – PPO

## 2022-10-25 ENCOUNTER — Inpatient Hospital Stay: Payer: BC Managed Care – PPO | Attending: Oncology

## 2022-10-25 VITALS — BP 104/72 | HR 61 | Temp 98.0°F | Resp 18 | Ht 65.0 in | Wt 130.1 lb

## 2022-10-25 DIAGNOSIS — C7B02 Secondary carcinoid tumors of liver: Secondary | ICD-10-CM | POA: Insufficient documentation

## 2022-10-25 DIAGNOSIS — C7A019 Malignant carcinoid tumor of the small intestine, unspecified portion: Secondary | ICD-10-CM | POA: Insufficient documentation

## 2022-10-25 MED ORDER — OCTREOTIDE ACETATE 30 MG IM KIT
30.0000 mg | PACK | Freq: Once | INTRAMUSCULAR | Status: AC
  Administered 2022-10-25: 30 mg via INTRAMUSCULAR

## 2022-10-25 NOTE — Patient Instructions (Signed)
Octreotide Injection Solution What is this medication? OCTREOTIDE (ok TREE oh tide) treats high levels of growth hormone (acromegaly). It works by reducing the amount of growth hormone your body makes. This reduces symptoms and the risk of health problems caused by too much growth hormone, such as diabetes and heart disease. It may also be used to treat diarrhea caused by neuroendocrine tumors. It works by slowing down the release of serotonin from the tumor cells. This reduces the number of bowel movements you have. This medicine may be used for other purposes; ask your health care provider or pharmacist if you have questions. COMMON BRAND NAME(S): Berline Lopes, Sandostatin What should I tell my care team before I take this medication? They need to know if you have any of these conditions: Diabetes Gallbladder disease Kidney disease Liver disease Thyroid disease An unusual or allergic reaction to octreotide, other medications, foods, dyes, or preservatives Pregnant or trying to get pregnant Breast-feeding How should I use this medication? This medication is injected under the skin or into a vein. It is usually given by your care team in a hospital or clinic setting. If you get this medication at home, you will be taught how to prepare and give it. Use exactly as directed. Take it as directed on the prescription label at the same time every day. Keep taking it unless your care team tells you to stop. Allow the injection solution to come to room temperature before use. Do not warm it artificially. It is important that you put your used needles and syringes in a special sharps container. Do not put them in a trash can. If you do not have a sharps container, call your pharmacist or care team to get one. Talk to your care team about the use of this medication in children. Special care may be needed. Overdosage: If you think you have taken too much of this medicine contact a poison control center or  emergency room at once. NOTE: This medicine is only for you. Do not share this medicine with others. What if I miss a dose? If you miss a dose, take it as soon as you can. If it is almost time for your next dose, take only that dose. Do not take double or extra doses. What may interact with this medication? Bromocriptine Certain medications for blood pressure, heart disease, irregular heartbeat Cyclosporine Diuretics Medications for diabetes, including insulin Quinidine This list may not describe all possible interactions. Give your health care provider a list of all the medicines, herbs, non-prescription drugs, or dietary supplements you use. Also tell them if you smoke, drink alcohol, or use illegal drugs. Some items may interact with your medicine. What should I watch for while using this medication? Visit your care team for regular checks on your progress. Tell your care team if your symptoms do not start to get better or if they get worse. To help reduce irritation at the injection site, use a different site for each injection and make sure the solution is at room temperature before use. This medication may cause decreases in blood sugar. Signs of low blood sugar include chills, cool, pale skin or cold sweats, drowsiness, extreme hunger, fast heartbeat, headache, nausea, nervousness or anxiety, shakiness, trembling, unsteadiness, tiredness, or weakness. Contact your care team right away if you experience any of these symptoms. This medication may increase blood sugar. The risk may be higher in patients who already have diabetes. Ask your care team what you can do to lower your  risk of diabetes while taking this medication. You should make sure you get enough vitamin B12 while you are taking this medication. Discuss the foods you eat and the vitamins you take with your care team. What side effects may I notice from receiving this medication? Side effects that you should report to your care  team as soon as possible: Allergic reactions--skin rash, itching, hives, swelling of the face, lips, tongue, or throat Gallbladder problems--severe stomach pain, nausea, vomiting, fever Heart rhythm changes--fast or irregular heartbeat, dizziness, feeling faint or lightheaded, chest pain, trouble breathing High blood sugar (hyperglycemia)--increased thirst or amount of urine, unusual weakness or fatigue, blurry vision Low blood sugar (hypoglycemia)--tremors or shaking, anxiety, sweating, cold or clammy skin, confusion, dizziness, rapid heartbeat Low thyroid levels (hypothyroidism)--unusual weakness or fatigue, increased sensitivity to cold, constipation, hair loss, dry skin, weight gain, feelings of depression Low vitamin B12 level--pain, tingling, or numbness in the hands or feet, muscle weakness, dizziness, confusion, trouble concentrating Pancreatitis--severe stomach pain that spreads to your back or gets worse after eating or when touched, fever, nausea, vomiting Side effects that usually do not require medical attention (report to your care team if they continue or are bothersome): Diarrhea Dizziness Gas Headache Pain, redness, or irritation at injection site Stomach pain This list may not describe all possible side effects. Call your doctor for medical advice about side effects. You may report side effects to FDA at 1-800-FDA-1088. Where should I keep my medication? Keep out of the reach of children and pets. Store in the refrigerator. Protect from light. Allow to come to room temperature naturally. Do not use artificial heat. If protected from light, the injection may be stored between 20 and 30 degrees C (70 and 86 degrees F) for 14 days. After the initial use, throw away any unused portion of a multiple dose vial after 14 days. Get rid of any unused portions of the ampules after use. To get rid of medications that are no longer needed or have expired: Take the medication to a medication  take-back program. Ask your pharmacy or law enforcement to find a location. If you cannot return the medication, ask your pharmacist or care team how to get rid of the medication safely. NOTE: This sheet is a summary. It may not cover all possible information. If you have questions about this medicine, talk to your doctor, pharmacist, or health care provider.  2024 [employer]vier/Gold Standard (2021-03-29 00:00:00)

## 2022-10-28 LAB — CHROMOGRANIN A: Chromogranin A (ng/mL): 62.3 ng/mL (ref 0.0–101.8)

## 2022-11-22 ENCOUNTER — Inpatient Hospital Stay: Payer: BC Managed Care – PPO

## 2022-11-22 ENCOUNTER — Inpatient Hospital Stay: Payer: BC Managed Care – PPO | Attending: Oncology | Admitting: Oncology

## 2022-11-22 VITALS — BP 95/71 | HR 57 | Temp 98.2°F | Resp 18 | Ht 65.0 in | Wt 129.5 lb

## 2022-11-22 DIAGNOSIS — D509 Iron deficiency anemia, unspecified: Secondary | ICD-10-CM | POA: Insufficient documentation

## 2022-11-22 DIAGNOSIS — C7A019 Malignant carcinoid tumor of the small intestine, unspecified portion: Secondary | ICD-10-CM

## 2022-11-22 DIAGNOSIS — Z79899 Other long term (current) drug therapy: Secondary | ICD-10-CM | POA: Insufficient documentation

## 2022-11-22 DIAGNOSIS — C7B02 Secondary carcinoid tumors of liver: Secondary | ICD-10-CM | POA: Insufficient documentation

## 2022-11-22 MED ORDER — OCTREOTIDE ACETATE 30 MG IM KIT
30.0000 mg | PACK | Freq: Once | INTRAMUSCULAR | Status: AC
  Administered 2022-11-22: 30 mg via INTRAMUSCULAR
  Filled 2022-11-22: qty 1

## 2022-11-22 NOTE — Progress Notes (Signed)
Brittney [name]   Diagnosis: Carcinoid tumor  INTERVAL HISTORY:   Brittney [name] returns as scheduled.  She continues monthly Sandostatin.  She feels well.  Good appetite and energy level.  No difficulty with bowel function.  No complaint.  No fever or flushing.  No diarrhea.  Objective:  Vital signs in last 24 hours:  Blood pressure 95/71, pulse (!) 57, temperature 98.2 F (36.8 C), temperature source Oral, resp. rate 18, height 5\' 5"  (1.651 m), weight 129 lb 8 oz (58.7 kg), last menstrual period 01/31/2014, SpO2 98%.   Resp: Lungs clear bilaterally Cardio: Regular rate and rhythm GI: No hepatosplenomegaly, nontender, no mass Vascular: No leg edema   Lab Results:  Lab Results  Component Value Date   WBC 4.8 04/12/2022   HGB 14.4 04/12/2022   HCT 43.8 04/12/2022   MCV 90.9 04/12/2022   PLT 198 04/12/2022   NEUTROABS 2.6 04/12/2022    CMP  Lab Results  Component Value Date   NA 141 04/12/2022   K 4.1 04/12/2022   CL 103 04/12/2022   CO2 31 04/12/2022   GLUCOSE 98 04/12/2022   BUN 23 (H) 04/12/2022   CREATININE 0.70 04/12/2022   CALCIUM 9.8 04/12/2022   PROT 7.5 04/12/2022   ALBUMIN 4.3 04/12/2022   AST 24 04/12/2022   ALT 14 04/12/2022   ALKPHOS 59 04/12/2022   BILITOT 0.4 04/12/2022   GFRNONAA >60 04/12/2022   GFRAA >60 01/06/2019    No results found for: "CEA1", "CEA", "CAN199", "CA125"  Lab Results  Component Value Date   INR 0.95 10/07/2016   LABPROT 12.6 10/07/2016    Imaging:  No results found.  Medications: I have reviewed the patient's current medications.   Assessment/Plan: Metastatic neuroendocrine tumor MRI of the abdomen 10/02/2016 confirmed multiple enhancing liver lesions consistent with metastases, no primary tumor site identified Ultrasound-guided biopsy of a left liver lesion 10/07/2016-neuroendocrine neoplasm, "intermediate grade ", review of pathology at GI tumor conference consistent with a  low-grade carcinoid tumor Elevated chromogranin A 10/15/2016  gallium-DOTATATE scan 10/23/2016-multiple foci of liver metastases, enlarged central mesenteric lymph nodes, short intussusception's in adjacent small bowel felt to represent a primary small bowel tumor, additional mesenteric lymph nodes with metabolic activity, deep right pelvic nodule consistent with a pelvic metastasis Small bowel resection 11/21/2016- mid ileum mass, low-grade neuroendocrine tumor,pT4,pN2, 6/18 lymph nodes positive, positive mesenteric resection margin (lymph node), intramural satellite nodule CT 11/30/2016-small bowel obstruction with transition point adjacent to suture line at the distal small bowel, liver metastases similar to the 10/02/2016 MRI Recurrent obstructive symptoms requiring repeat surgery with resection of the small bowel anastomosis site 12/13/2016, pathology negative for malignancy Initiation of monthly Sandostatin 12/20/2016 CT abdomen/pelvis 02/19/2017-mild enlargement of liver lesions compared to November 2018 Cycle 1 Lutathera 06/17/2017 Cycle 2 Lutathera 08/13/2017 Cycle 3 Lutathera 10/07/2017 Cycle 4 Lutathera 12/02/2017 Dotatate scan 12/29/2017-no evidence of new or metastatic disease or progression.  Multiple hepatic metastasis.  Lesions have decreased in size from the most recent CT scan and slightly increased in radiotracer activity from dotatate PET scan 10/23/2016.  Interval resection of small bowel tumor and bulky mesenteric metastasis.  No residual activity in the bowel or mesentery.  Single small focus of activity in the deep right pelvis along the peritoneal surface not changed from comparison exam. Monthly Sandostatin continued Netspot 06/23/2018- mild decrease in radiotracer activity of all hepatic metastases, no new lesions, slight enlargement of several liver lesions Monthly Sandostatin continued Netspot 03/31/2019-no new liver  lesions, no change in the size of liver lesions with a mild  decrease in tracer activity involving several lesions, stable right pelvic peritoneal implant Monthly Sandostatin continued Netspot 12/07/2019-no new liver lesion, focus of activity left facet joint at C4-C5 has increased, intense activity associated with fractures of the left ninth and 10th ribs, unchanged size and number of liver lesions with increase in radiotracer activity-potentially indicating mild progression Netspot 08/14/2020-decrease in radiotracer activity associated with liver lesions, no new lesions, the liver lesions have not enlarged, decreased radiotracer activity associated with a small right pelvic peritoneal implant, decreased activity associated with degenerative changes in the cervical and thoracic spine, no evidence of progressive disease Monthly Sandostatin continued Netspot 05/28/2021-stable multifocal radiotracer avid liver metastases, no increase in size, no new liver lesions, stable small deep right pelvic implant, no evidence of progressive disease Monthly Sandostatin continued Netspot 05/10/2022-no change in size or radiotracer activity associated with liver metastases, no new lesion, stable small peritoneal lesion in the right pelvis Monthly Sandostatin continued   2.  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  Intermittent abdominal bloating and left-sided abdominal pain- resolved   4.  Admission 11/30/2016 with a small bowel obstruction       Disposition: Brittney [name] appears stable.  The chromogranin A level was stable last month.  She does not have symptoms to suggest progression of the carcinoid tumor.  She will continue monthly Sandostatin.  She will be scheduled for an office visit in March.  We will plan for a restaging Netspot in June.  Thornton Papas, MD  11/22/2022  10:48 AM

## 2022-12-20 ENCOUNTER — Inpatient Hospital Stay: Payer: BC Managed Care – PPO | Attending: Oncology

## 2022-12-20 VITALS — BP 89/62 | HR 60 | Temp 98.0°F | Resp 16

## 2022-12-20 DIAGNOSIS — C7A019 Malignant carcinoid tumor of the small intestine, unspecified portion: Secondary | ICD-10-CM | POA: Diagnosis present

## 2022-12-20 DIAGNOSIS — Z79899 Other long term (current) drug therapy: Secondary | ICD-10-CM | POA: Insufficient documentation

## 2022-12-20 DIAGNOSIS — C7B02 Secondary carcinoid tumors of liver: Secondary | ICD-10-CM | POA: Insufficient documentation

## 2022-12-20 MED ORDER — OCTREOTIDE ACETATE 30 MG IM KIT
30.0000 mg | PACK | Freq: Once | INTRAMUSCULAR | Status: AC
  Administered 2022-12-20: 30 mg via INTRAMUSCULAR
  Filled 2022-12-20: qty 1

## 2023-01-17 ENCOUNTER — Inpatient Hospital Stay: Payer: BC Managed Care – PPO | Attending: Oncology

## 2023-01-17 ENCOUNTER — Ambulatory Visit: Payer: BC Managed Care – PPO

## 2023-01-17 VITALS — BP 93/70 | HR 60 | Temp 97.8°F | Resp 16

## 2023-01-17 DIAGNOSIS — C7B02 Secondary carcinoid tumors of liver: Secondary | ICD-10-CM | POA: Insufficient documentation

## 2023-01-17 DIAGNOSIS — C7A019 Malignant carcinoid tumor of the small intestine, unspecified portion: Secondary | ICD-10-CM | POA: Diagnosis present

## 2023-01-17 MED ORDER — OCTREOTIDE ACETATE 30 MG IM KIT
30.0000 mg | PACK | Freq: Once | INTRAMUSCULAR | Status: AC
  Administered 2023-01-17: 30 mg via INTRAMUSCULAR
  Filled 2023-01-17: qty 1

## 2023-02-14 ENCOUNTER — Inpatient Hospital Stay: Payer: BC Managed Care – PPO | Attending: Oncology

## 2023-02-14 ENCOUNTER — Other Ambulatory Visit: Payer: BC Managed Care – PPO

## 2023-02-14 ENCOUNTER — Inpatient Hospital Stay: Payer: BC Managed Care – PPO

## 2023-02-14 VITALS — BP 106/72 | HR 51 | Temp 97.7°F | Resp 16

## 2023-02-14 DIAGNOSIS — C7B02 Secondary carcinoid tumors of liver: Secondary | ICD-10-CM | POA: Insufficient documentation

## 2023-02-14 DIAGNOSIS — Z79899 Other long term (current) drug therapy: Secondary | ICD-10-CM | POA: Diagnosis not present

## 2023-02-14 DIAGNOSIS — C7A019 Malignant carcinoid tumor of the small intestine, unspecified portion: Secondary | ICD-10-CM | POA: Insufficient documentation

## 2023-02-14 LAB — CBC WITH DIFFERENTIAL (CANCER CENTER ONLY)
Abs Immature Granulocytes: 0.01 10*3/uL (ref 0.00–0.07)
Basophils Absolute: 0 10*3/uL (ref 0.0–0.1)
Basophils Relative: 1 %
Eosinophils Absolute: 0.2 10*3/uL (ref 0.0–0.5)
Eosinophils Relative: 3 %
HCT: 43.3 % (ref 36.0–46.0)
Hemoglobin: 14.1 g/dL (ref 12.0–15.0)
Immature Granulocytes: 0 %
Lymphocytes Relative: 43 %
Lymphs Abs: 2.3 10*3/uL (ref 0.7–4.0)
MCH: 29.9 pg (ref 26.0–34.0)
MCHC: 32.6 g/dL (ref 30.0–36.0)
MCV: 91.7 fL (ref 80.0–100.0)
Monocytes Absolute: 0.3 10*3/uL (ref 0.1–1.0)
Monocytes Relative: 6 %
Neutro Abs: 2.5 10*3/uL (ref 1.7–7.7)
Neutrophils Relative %: 47 %
Platelet Count: 219 10*3/uL (ref 150–400)
RBC: 4.72 MIL/uL (ref 3.87–5.11)
RDW: 12.8 % (ref 11.5–15.5)
WBC Count: 5.3 10*3/uL (ref 4.0–10.5)
nRBC: 0 % (ref 0.0–0.2)

## 2023-02-14 LAB — CMP (CANCER CENTER ONLY)
ALT: 14 U/L (ref 0–44)
AST: 23 U/L (ref 15–41)
Albumin: 4.3 g/dL (ref 3.5–5.0)
Alkaline Phosphatase: 53 U/L (ref 38–126)
Anion gap: 6 (ref 5–15)
BUN: 18 mg/dL (ref 6–20)
CO2: 31 mmol/L (ref 22–32)
Calcium: 9.1 mg/dL (ref 8.9–10.3)
Chloride: 101 mmol/L (ref 98–111)
Creatinine: 0.74 mg/dL (ref 0.44–1.00)
GFR, Estimated: >60 mL/min (ref >60)
Glucose, Bld: 82 mg/dL (ref 70–99)
Potassium: 4 mmol/L (ref 3.5–5.1)
Sodium: 138 mmol/L (ref 135–145)
Total Bilirubin: 0.5 mg/dL (ref 0.0–1.2)
Total Protein: 7.3 g/dL (ref 6.5–8.1)

## 2023-02-14 MED ORDER — OCTREOTIDE ACETATE 30 MG IM KIT
30.0000 mg | PACK | Freq: Once | INTRAMUSCULAR | Status: AC
  Administered 2023-02-14: 30 mg via INTRAMUSCULAR
  Filled 2023-02-14: qty 1

## 2023-02-14 MED ORDER — ONDANSETRON HCL 8 MG PO TABS: 8.0000 mg | ORAL_TABLET | Freq: Three times a day (TID) | ORAL | 1 refills | Status: AC | PRN

## 2023-02-17 LAB — CHROMOGRANIN A: Chromogranin A (ng/mL): 68.3 ng/mL (ref 0.0–101.8)

## 2023-03-13 ENCOUNTER — Inpatient Hospital Stay: Payer: BC Managed Care – PPO | Attending: Oncology | Admitting: Oncology

## 2023-03-13 ENCOUNTER — Inpatient Hospital Stay: Payer: BC Managed Care – PPO

## 2023-03-13 VITALS — BP 105/68 | HR 69 | Temp 97.9°F | Resp 18 | Ht 65.0 in | Wt 131.5 lb

## 2023-03-13 DIAGNOSIS — D509 Iron deficiency anemia, unspecified: Secondary | ICD-10-CM | POA: Diagnosis not present

## 2023-03-13 DIAGNOSIS — C786 Secondary malignant neoplasm of retroperitoneum and peritoneum: Secondary | ICD-10-CM | POA: Diagnosis not present

## 2023-03-13 DIAGNOSIS — Z79899 Other long term (current) drug therapy: Secondary | ICD-10-CM | POA: Insufficient documentation

## 2023-03-13 DIAGNOSIS — C7A019 Malignant carcinoid tumor of the small intestine, unspecified portion: Secondary | ICD-10-CM | POA: Insufficient documentation

## 2023-03-13 DIAGNOSIS — C787 Secondary malignant neoplasm of liver and intrahepatic bile duct: Secondary | ICD-10-CM | POA: Diagnosis not present

## 2023-03-13 MED ORDER — OCTREOTIDE ACETATE 30 MG IM KIT
30.0000 mg | PACK | Freq: Once | INTRAMUSCULAR | Status: AC
  Administered 2023-03-13: 30 mg via INTRAMUSCULAR
  Filled 2023-03-13: qty 1

## 2023-03-13 NOTE — Progress Notes (Signed)
 [facility] OFFICE PROGRESS NOTE   Diagnosis: Carcinoid tumor  INTERVAL HISTORY:   Brittney [name] returns as scheduled.  She feels well.  No flushing or diarrhea.  She is exercising.  No complaint.  She continues monthly Sandostatin.  Objective:  Vital signs in last 24 hours:  Blood pressure 105/68, pulse 69, temperature 97.9 F (36.6 C), temperature source Temporal, resp. rate 18, height 5\' 5"  (1.651 m), weight 131 lb 8 oz (59.6 kg), last menstrual period 01/31/2014, SpO2 99%.    Resp: Lungs clear bilaterally Cardio: Regular rate and rhythm GI: No mass, nontender, no hepatosplenomegaly, no apparent ascites Vascular: No leg edema   Lab Results:  Lab Results  Component Value Date   WBC 5.3 02/14/2023   HGB 14.1 02/14/2023   HCT 43.3 02/14/2023   MCV 91.7 02/14/2023   PLT 219 02/14/2023   NEUTROABS 2.5 02/14/2023    CMP  Lab Results  Component Value Date   NA 138 02/14/2023   K 4.0 02/14/2023   CL 101 02/14/2023   CO2 31 02/14/2023   GLUCOSE 82 02/14/2023   BUN 18 02/14/2023   CREATININE 0.74 02/14/2023   CALCIUM 9.1 02/14/2023   PROT 7.3 02/14/2023   ALBUMIN 4.3 02/14/2023   AST 23 02/14/2023   ALT 14 02/14/2023   ALKPHOS 53 02/14/2023   BILITOT 0.5 02/14/2023   GFRNONAA >60 02/14/2023   GFRAA >60 01/06/2019     Medications: I have reviewed the patient's current medications.   Assessment/Plan: Metastatic neuroendocrine tumor MRI of the abdomen 10/02/2016 confirmed multiple enhancing liver lesions consistent with metastases, no primary tumor site identified Ultrasound-guided biopsy of a left liver lesion 10/07/2016-neuroendocrine neoplasm, "intermediate grade ", review of pathology at GI tumor conference consistent with a low-grade carcinoid tumor Elevated chromogranin A 10/15/2016  gallium-DOTATATE scan 10/23/2016-multiple foci of liver metastases, enlarged central mesenteric lymph nodes, short intussusception's in adjacent small bowel felt  to represent a primary small bowel tumor, additional mesenteric lymph nodes with metabolic activity, deep right pelvic nodule consistent with a pelvic metastasis Small bowel resection 11/21/2016- mid ileum mass, low-grade neuroendocrine tumor,pT4,pN2, 6/18 lymph nodes positive, positive mesenteric resection margin (lymph node), intramural satellite nodule CT 11/30/2016-small bowel obstruction with transition point adjacent to suture line at the distal small bowel, liver metastases similar to the 10/02/2016 MRI Recurrent obstructive symptoms requiring repeat surgery with resection of the small bowel anastomosis site 12/13/2016, pathology negative for malignancy Initiation of monthly Sandostatin 12/20/2016 CT abdomen/pelvis 02/19/2017-mild enlargement of liver lesions compared to November 2018 Cycle 1 Lutathera 06/17/2017 Cycle 2 Lutathera 08/13/2017 Cycle 3 Lutathera 10/07/2017 Cycle 4 Lutathera 12/02/2017 Dotatate scan 12/29/2017-no evidence of new or metastatic disease or progression.  Multiple hepatic metastasis.  Lesions have decreased in size from the most recent CT scan and slightly increased in radiotracer activity from dotatate PET scan 10/23/2016.  Interval resection of small bowel tumor and bulky mesenteric metastasis.  No residual activity in the bowel or mesentery.  Single small focus of activity in the deep right pelvis along the peritoneal surface not changed from comparison exam. Monthly Sandostatin continued Netspot 06/23/2018- mild decrease in radiotracer activity of all hepatic metastases, no new lesions, slight enlargement of several liver lesions Monthly Sandostatin continued Netspot 03/31/2019-no new liver lesions, no change in the size of liver lesions with a mild decrease in tracer activity involving several lesions, stable right pelvic peritoneal implant Monthly Sandostatin continued Netspot 12/07/2019-no new liver lesion, focus of activity left facet joint at C4-C5 has increased,  intense activity associated with fractures of the left ninth and 10th ribs, unchanged size and number of liver lesions with increase in radiotracer activity-potentially indicating mild progression Netspot 08/14/2020-decrease in radiotracer activity associated with liver lesions, no new lesions, the liver lesions have not enlarged, decreased radiotracer activity associated with a small right pelvic peritoneal implant, decreased activity associated with degenerative changes in the cervical and thoracic spine, no evidence of progressive disease Monthly Sandostatin continued Netspot 05/28/2021-stable multifocal radiotracer avid liver metastases, no increase in size, no new liver lesions, stable small deep right pelvic implant, no evidence of progressive disease Monthly Sandostatin continued Netspot 05/10/2022-no change in size or radiotracer activity associated with liver metastases, no new lesion, stable small peritoneal lesion in the right pelvis Monthly Sandostatin continued   2.  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  Intermittent abdominal bloating and left-sided abdominal pain- resolved   4.  Admission 11/30/2016 with a small bowel obstruction       Disposition: Brittney [name] appears stable.  There is no clinical evidence for progression of the carcinoid tumor and the chromogranin a was normal on 02/14/2023.  She will continue monthly Sandostatin.  She will be scheduled for a restaging dotatate scan in June.  She will return for an office visit and Sandostatin during the week of 07/14/2023.  Thornton Papas, MD  03/13/2023  9:44 AM

## 2023-03-14 ENCOUNTER — Ambulatory Visit: Payer: BC Managed Care – PPO

## 2023-03-14 ENCOUNTER — Ambulatory Visit: Payer: BC Managed Care – PPO | Admitting: Oncology

## 2023-04-01 ENCOUNTER — Encounter: Payer: Self-pay | Admitting: Oncology

## 2023-04-08 ENCOUNTER — Encounter: Payer: Self-pay | Admitting: *Deleted

## 2023-04-11 ENCOUNTER — Other Ambulatory Visit: Payer: Self-pay | Admitting: *Deleted

## 2023-04-11 ENCOUNTER — Other Ambulatory Visit: Payer: BC Managed Care – PPO

## 2023-04-11 ENCOUNTER — Other Ambulatory Visit: Payer: Self-pay

## 2023-04-11 ENCOUNTER — Inpatient Hospital Stay: Payer: BC Managed Care – PPO | Attending: Oncology

## 2023-04-11 VITALS — BP 100/69 | HR 62 | Temp 97.8°F | Resp 16 | Ht 65.0 in | Wt 134.9 lb

## 2023-04-11 DIAGNOSIS — C7A019 Malignant carcinoid tumor of the small intestine, unspecified portion: Secondary | ICD-10-CM | POA: Insufficient documentation

## 2023-04-11 DIAGNOSIS — Z79899 Other long term (current) drug therapy: Secondary | ICD-10-CM | POA: Diagnosis not present

## 2023-04-11 MED ORDER — LORAZEPAM 0.5 MG PO TABS: 0.5000 mg | ORAL_TABLET | Freq: Three times a day (TID) | ORAL | 0 refills | Status: AC | PRN

## 2023-04-11 MED ORDER — OCTREOTIDE ACETATE 30 MG IM KIT
30.0000 mg | PACK | Freq: Once | INTRAMUSCULAR | Status: AC
  Administered 2023-04-11: 30 mg via INTRAMUSCULAR

## 2023-04-11 MED ORDER — OCTREOTIDE ACETATE 30 MG IM KIT: 30.0000 mg | PACK | Freq: Once | INTRAMUSCULAR | Status: DC

## 2023-04-11 NOTE — Telephone Encounter (Signed)
 Mrs. Brittney [name] asking for something to help her relax on plane with upcoming trip. Per Dr. Truett Perna: Ativan 0.5 mg

## 2023-04-11 NOTE — Patient Instructions (Signed)
 [facility]  Discharge Instructions: Thank you for choosing [facility] to provide your oncology and hematology care.   If you have a lab appointment with the Cancer Center, please go directly to the Cancer Center and check in at the registration area.   Wear comfortable clothing and clothing appropriate for easy access to any Portacath or PICC line.   We strive to give you quality time with your provider. You may need to reschedule your appointment if you arrive late (15 or more minutes).  Arriving late affects you and other patients whose appointments are after yours.  Also, if you miss three or more appointments without notifying the office, you may be dismissed from the clinic at the provider's discretion.      For prescription refill requests, have your pharmacy contact our office and allow 72 hours for refills to be completed.    Today you received the following Sandostatin.  Octreotide Injection Solution What is this medication? OCTREOTIDE (ok TREE oh tide) treats high levels of growth hormone (acromegaly). It works by reducing the amount of growth hormone your body makes. This reduces symptoms and the risk of health problems caused by too much growth hormone, such as diabetes and heart disease. It may also be used to treat diarrhea caused by neuroendocrine tumors. It works by slowing down the release of serotonin from the tumor cells. This reduces the number of bowel movements you have. This medicine may be used for other purposes; ask your health care provider or pharmacist if you have questions. COMMON BRAND NAME(S): Berline Lopes, Sandostatin What should I tell my care team before I take this medication? They need to know if you have any of these conditions: Diabetes Gallbladder disease Heart disease Kidney disease Liver disease Pancreatic disease Thyroid disease An unusual or allergic reaction to octreotide,  other medications, foods, dyes, or preservatives Pregnant or trying to get pregnant Breastfeeding How should I use this medication? This medication is injected under the skin or into a vein. It is usually given by your care team in a hospital or clinic setting. If you get this medication at home, you will be taught how to prepare and give it. Use exactly as directed. Take it as directed on the prescription label at the same time every day. Keep taking it unless your care team tells you to stop. Allow the injection solution to come to room temperature before use. Do not warm it artificially. It is important that you put your used needles and syringes in a special sharps container. Do not put them in a trash can. If you do not have a sharps container, call your pharmacist or care team to get one. Talk to your care team about the use of this medication in children. Special care may be needed. Overdosage: If you think you have taken too much of this medicine contact a poison control center or emergency room at once. NOTE: This medicine is only for you. Do not share this medicine with others. What if I miss a dose? If you miss a dose, take it as soon as you can. If it is almost time for your next dose, take only that dose. Do not take double or extra doses. What may interact with this medication? Bromocriptine Certain medications for blood pressure, heart disease, irregular heartbeat Cyclosporine Diuretics Medications for diabetes, including insulin Quinidine This list may not describe all possible interactions. Give your health  care provider a list of all the medicines, herbs, non-prescription drugs, or dietary supplements you use. Also tell them if you smoke, drink alcohol, or use illegal drugs. Some items may interact with your medicine. What should I watch for while using this medication? Visit your care team for regular checks on your progress. Tell your care team if your symptoms do not start  to get better or if they get worse. To help reduce irritation at the injection site, use a different site for each injection and make sure the solution is at room temperature before use. This medication may cause decreases in blood sugar. Signs of low blood sugar include chills, cool, pale skin or cold sweats, drowsiness, extreme hunger, fast heartbeat, headache, nausea, nervousness or anxiety, shakiness, trembling, unsteadiness, tiredness, or weakness. Contact your care team right away if you experience any of these symptoms. This medication may increase blood sugar. The risk may be higher in patients who already have diabetes. Ask your care team what you can do to lower your risk of diabetes while taking this medication. You should make sure you get enough vitamin B12 while you are taking this medication. Discuss the foods you eat and the vitamins you take with your care team. What side effects may I notice from receiving this medication? Side effects that you should report to your care team as soon as possible: Allergic reactions--skin rash, itching, hives, swelling of the face, lips, tongue, or throat Gallbladder problems--severe stomach pain, nausea, vomiting, fever Heart rhythm changes--fast or irregular heartbeat, dizziness, feeling faint or lightheaded, chest pain, trouble breathing High blood sugar (hyperglycemia)--increased thirst or amount of urine, unusual weakness or fatigue, blurry vision Low blood sugar (hypoglycemia)--tremors or shaking, anxiety, sweating, cold or clammy skin, confusion, dizziness, rapid heartbeat Low thyroid levels (hypothyroidism)--unusual weakness or fatigue, increased sensitivity to cold, constipation, hair loss, dry skin, weight gain, feelings of depression Low vitamin B12 level--pain, tingling, or numbness in the hands or feet, muscle weakness, dizziness, confusion, trouble concentrating Oily or light-colored stools, diarrhea, bloating, weight  loss Pancreatitis--severe stomach pain that spreads to your back or gets worse after eating or when touched, fever, nausea, vomiting Slow heartbeat--dizziness, feeling faint or lightheaded, confusion, trouble breathing, unusual weakness or fatigue Side effects that usually do not require medical attention (report these to your care team if they continue or are bothersome): Diarrhea Dizziness Headache Nausea Pain, redness, or irritation at injection site Stomach pain This list may not describe all possible side effects. Call your doctor for medical advice about side effects. You may report side effects to FDA at 1-800-FDA-1088. Where should I keep my medication? Keep out of the reach of children and pets. Store in the refrigerator. Protect from light. Allow to come to room temperature naturally. Do not use artificial heat. If protected from light, the injection may be stored between 20 and 30 degrees C (70 and 86 degrees F) for 14 days. After the initial use, throw away any unused portion of a multiple dose vial after 14 days. Get rid of any unused portions of the ampules after use. To get rid of medications that are no longer needed or have expired: Take the medication to a medication take-back program. Ask your pharmacy or law enforcement to find a location. If you cannot return the medication, ask your pharmacist or care team how to get rid of the medication safely. NOTE: This sheet is a summary. It may not cover all possible information. If you have questions about this medicine,  talk to your doctor, pharmacist, or health care provider.  2024 Elsevier/Gold Standard (2022-12-06 00:00:00)  To help prevent nausea and vomiting after your treatment, we encourage you to take your nausea medication as directed.  BELOW ARE SYMPTOMS THAT SHOULD BE REPORTED IMMEDIATELY: *FEVER GREATER THAN 100.4 F (38 C) OR HIGHER *CHILLS OR SWEATING *NAUSEA AND VOMITING THAT IS NOT CONTROLLED WITH YOUR NAUSEA  MEDICATION *UNUSUAL SHORTNESS OF BREATH *UNUSUAL BRUISING OR BLEEDING *URINARY PROBLEMS (pain or burning when urinating, or frequent urination) *BOWEL PROBLEMS (unusual diarrhea, constipation, pain near the anus) TENDERNESS IN MOUTH AND THROAT WITH OR WITHOUT PRESENCE OF ULCERS (sore throat, sores in mouth, or a toothache) UNUSUAL RASH, SWELLING OR PAIN  UNUSUAL VAGINAL DISCHARGE OR ITCHING   Items with * indicate a potential emergency and should be followed up as soon as possible or go to the Emergency Department if any problems should occur.  Please show the CHEMOTHERAPY ALERT CARD or IMMUNOTHERAPY ALERT CARD at check-in to the Emergency Department and triage nurse.  Should you have questions after your visit or need to cancel or reschedule your appointment, please contact [facility]  Dept: [phone]  and follow the prompts.  Office hours are 8:00 a.m. to 4:30 p.m. Monday - Friday. Please note that voicemails left after 4:00 p.m. may not be returned until the following business day.  We are closed weekends and major holidays. You have access to a nurse at all times for urgent questions. Please call the main number to the clinic Dept: [phone] and follow the prompts.   For any non-urgent questions, you may also contact your provider using MyChart. We now offer e-Visits for anyone 86 and older to request care online for non-urgent symptoms. For details visit [email or web address].   Also download the MyChart app! Go to the app store, search "MyChart", open the app, select [place], and log in with your MyChart username and password.

## 2023-04-11 NOTE — Progress Notes (Signed)
 Patient presents today for Sandostatin injection. Patient tolerated injection in left upper gluteal well with no complaints voiced.  Site clean and dry with no bruising or swelling noted.  No complaints of pain.  Discharged with vital signs stable and no signs or symptoms of distress noted.

## 2023-05-14 ENCOUNTER — Encounter: Payer: Self-pay | Admitting: Oncology

## 2023-05-16 ENCOUNTER — Inpatient Hospital Stay: Payer: BC Managed Care – PPO | Attending: Oncology

## 2023-05-16 VITALS — BP 109/71 | HR 69 | Temp 97.6°F | Resp 18

## 2023-05-16 DIAGNOSIS — C7A019 Malignant carcinoid tumor of the small intestine, unspecified portion: Secondary | ICD-10-CM | POA: Insufficient documentation

## 2023-05-16 DIAGNOSIS — Z79899 Other long term (current) drug therapy: Secondary | ICD-10-CM | POA: Insufficient documentation

## 2023-05-16 MED ORDER — OCTREOTIDE ACETATE 30 MG IM KIT
30.0000 mg | PACK | Freq: Once | INTRAMUSCULAR | Status: AC
  Administered 2023-05-16: 30 mg via INTRAMUSCULAR

## 2023-05-16 NOTE — Patient Instructions (Signed)
 [facility]  Discharge Instructions: Thank you for choosing [facility] to provide your oncology and hematology care.   If you have a lab appointment with the Cancer Center, please go directly to the Cancer Center and check in at the registration area.   Wear comfortable clothing and clothing appropriate for easy access to any Portacath or PICC line.   We strive to give you quality time with your provider. You may need to reschedule your appointment if you arrive late (15 or more minutes).  Arriving late affects you and other patients whose appointments are after yours.  Also, if you miss three or more appointments without notifying the office, you may be dismissed from the clinic at the provider's discretion.      For prescription refill requests, have your pharmacy contact our office and allow 72 hours for refills to be completed.    Today you received the following Sandostatin.  Octreotide Injection Solution What is this medication? OCTREOTIDE (ok TREE oh tide) treats high levels of growth hormone (acromegaly). It works by reducing the amount of growth hormone your body makes. This reduces symptoms and the risk of health problems caused by too much growth hormone, such as diabetes and heart disease. It may also be used to treat diarrhea caused by neuroendocrine tumors. It works by slowing down the release of serotonin from the tumor cells. This reduces the number of bowel movements you have. This medicine may be used for other purposes; ask your health care provider or pharmacist if you have questions. COMMON BRAND NAME(S): Berline Lopes, Sandostatin What should I tell my care team before I take this medication? They need to know if you have any of these conditions: Diabetes Gallbladder disease Heart disease Kidney disease Liver disease Pancreatic disease Thyroid disease An unusual or allergic reaction to octreotide,  other medications, foods, dyes, or preservatives Pregnant or trying to get pregnant Breastfeeding How should I use this medication? This medication is injected under the skin or into a vein. It is usually given by your care team in a hospital or clinic setting. If you get this medication at home, you will be taught how to prepare and give it. Use exactly as directed. Take it as directed on the prescription label at the same time every day. Keep taking it unless your care team tells you to stop. Allow the injection solution to come to room temperature before use. Do not warm it artificially. It is important that you put your used needles and syringes in a special sharps container. Do not put them in a trash can. If you do not have a sharps container, call your pharmacist or care team to get one. Talk to your care team about the use of this medication in children. Special care may be needed. Overdosage: If you think you have taken too much of this medicine contact a poison control center or emergency room at once. NOTE: This medicine is only for you. Do not share this medicine with others. What if I miss a dose? If you miss a dose, take it as soon as you can. If it is almost time for your next dose, take only that dose. Do not take double or extra doses. What may interact with this medication? Bromocriptine Certain medications for blood pressure, heart disease, irregular heartbeat Cyclosporine Diuretics Medications for diabetes, including insulin Quinidine This list may not describe all possible interactions. Give your health  care provider a list of all the medicines, herbs, non-prescription drugs, or dietary supplements you use. Also tell them if you smoke, drink alcohol, or use illegal drugs. Some items may interact with your medicine. What should I watch for while using this medication? Visit your care team for regular checks on your progress. Tell your care team if your symptoms do not start  to get better or if they get worse. To help reduce irritation at the injection site, use a different site for each injection and make sure the solution is at room temperature before use. This medication may cause decreases in blood sugar. Signs of low blood sugar include chills, cool, pale skin or cold sweats, drowsiness, extreme hunger, fast heartbeat, headache, nausea, nervousness or anxiety, shakiness, trembling, unsteadiness, tiredness, or weakness. Contact your care team right away if you experience any of these symptoms. This medication may increase blood sugar. The risk may be higher in patients who already have diabetes. Ask your care team what you can do to lower your risk of diabetes while taking this medication. You should make sure you get enough vitamin B12 while you are taking this medication. Discuss the foods you eat and the vitamins you take with your care team. What side effects may I notice from receiving this medication? Side effects that you should report to your care team as soon as possible: Allergic reactions--skin rash, itching, hives, swelling of the face, lips, tongue, or throat Gallbladder problems--severe stomach pain, nausea, vomiting, fever Heart rhythm changes--fast or irregular heartbeat, dizziness, feeling faint or lightheaded, chest pain, trouble breathing High blood sugar (hyperglycemia)--increased thirst or amount of urine, unusual weakness or fatigue, blurry vision Low blood sugar (hypoglycemia)--tremors or shaking, anxiety, sweating, cold or clammy skin, confusion, dizziness, rapid heartbeat Low thyroid levels (hypothyroidism)--unusual weakness or fatigue, increased sensitivity to cold, constipation, hair loss, dry skin, weight gain, feelings of depression Low vitamin B12 level--pain, tingling, or numbness in the hands or feet, muscle weakness, dizziness, confusion, trouble concentrating Oily or light-colored stools, diarrhea, bloating, weight  loss Pancreatitis--severe stomach pain that spreads to your back or gets worse after eating or when touched, fever, nausea, vomiting Slow heartbeat--dizziness, feeling faint or lightheaded, confusion, trouble breathing, unusual weakness or fatigue Side effects that usually do not require medical attention (report these to your care team if they continue or are bothersome): Diarrhea Dizziness Headache Nausea Pain, redness, or irritation at injection site Stomach pain This list may not describe all possible side effects. Call your doctor for medical advice about side effects. You may report side effects to FDA at 1-800-FDA-1088. Where should I keep my medication? Keep out of the reach of children and pets. Store in the refrigerator. Protect from light. Allow to come to room temperature naturally. Do not use artificial heat. If protected from light, the injection may be stored between 20 and 30 degrees C (70 and 86 degrees F) for 14 days. After the initial use, throw away any unused portion of a multiple dose vial after 14 days. Get rid of any unused portions of the ampules after use. To get rid of medications that are no longer needed or have expired: Take the medication to a medication take-back program. Ask your pharmacy or law enforcement to find a location. If you cannot return the medication, ask your pharmacist or care team how to get rid of the medication safely. NOTE: This sheet is a summary. It may not cover all possible information. If you have questions about this medicine,  talk to your doctor, pharmacist, or health care provider.  2024 Elsevier/Gold Standard (2022-12-06 00:00:00)  To help prevent nausea and vomiting after your treatment, we encourage you to take your nausea medication as directed.  BELOW ARE SYMPTOMS THAT SHOULD BE REPORTED IMMEDIATELY: *FEVER GREATER THAN 100.4 F (38 C) OR HIGHER *CHILLS OR SWEATING *NAUSEA AND VOMITING THAT IS NOT CONTROLLED WITH YOUR NAUSEA  MEDICATION *UNUSUAL SHORTNESS OF BREATH *UNUSUAL BRUISING OR BLEEDING *URINARY PROBLEMS (pain or burning when urinating, or frequent urination) *BOWEL PROBLEMS (unusual diarrhea, constipation, pain near the anus) TENDERNESS IN MOUTH AND THROAT WITH OR WITHOUT PRESENCE OF ULCERS (sore throat, sores in mouth, or a toothache) UNUSUAL RASH, SWELLING OR PAIN  UNUSUAL VAGINAL DISCHARGE OR ITCHING   Items with * indicate a potential emergency and should be followed up as soon as possible or go to the Emergency Department if any problems should occur.  Please show the CHEMOTHERAPY ALERT CARD or IMMUNOTHERAPY ALERT CARD at check-in to the Emergency Department and triage nurse.  Should you have questions after your visit or need to cancel or reschedule your appointment, please contact [facility]  Dept: [phone]  and follow the prompts.  Office hours are 8:00 a.m. to 4:30 p.m. Monday - Friday. Please note that voicemails left after 4:00 p.m. may not be returned until the following business day.  We are closed weekends and major holidays. You have access to a nurse at all times for urgent questions. Please call the main number to the clinic Dept: [phone] and follow the prompts.   For any non-urgent questions, you may also contact your provider using MyChart. We now offer e-Visits for anyone 86 and older to request care online for non-urgent symptoms. For details visit [email or web address].   Also download the MyChart app! Go to the app store, search "MyChart", open the app, select [place], and log in with your MyChart username and password.

## 2023-05-16 NOTE — Progress Notes (Signed)
 Patient presents today for Sandostatin  injection. Patient tolerated injection in right upper gluteal with no complaints voiced.  Site clean and dry with no bruising or swelling noted.  No complaints of pain.  Discharged with vital signs stable and no signs or symptoms of distress noted.

## 2023-06-11 ENCOUNTER — Encounter: Payer: Self-pay | Admitting: Oncology

## 2023-06-13 ENCOUNTER — Encounter (HOSPITAL_COMMUNITY)
Admission: RE | Admit: 2023-06-13 | Discharge: 2023-06-13 | Disposition: A | Discharge status: Home / Self Care | Source: Ambulatory Visit | Attending: Oncology | Admitting: Oncology

## 2023-06-13 ENCOUNTER — Ambulatory Visit

## 2023-06-13 ENCOUNTER — Inpatient Hospital Stay

## 2023-06-13 ENCOUNTER — Other Ambulatory Visit

## 2023-06-13 VITALS — BP 107/71 | HR 70 | Temp 97.8°F | Resp 16

## 2023-06-13 DIAGNOSIS — Z79899 Other long term (current) drug therapy: Secondary | ICD-10-CM | POA: Insufficient documentation

## 2023-06-13 DIAGNOSIS — C7B02 Secondary carcinoid tumors of liver: Secondary | ICD-10-CM | POA: Diagnosis not present

## 2023-06-13 DIAGNOSIS — Z79818 Long term (current) use of other agents affecting estrogen receptors and estrogen levels: Secondary | ICD-10-CM | POA: Insufficient documentation

## 2023-06-13 DIAGNOSIS — C7A019 Malignant carcinoid tumor of the small intestine, unspecified portion: Secondary | ICD-10-CM | POA: Diagnosis present

## 2023-06-13 MED ORDER — OCTREOTIDE ACETATE 30 MG IM KIT
30.0000 mg | PACK | Freq: Once | INTRAMUSCULAR | Status: AC
  Administered 2023-06-13: 30 mg via INTRAMUSCULAR

## 2023-06-13 MED ORDER — COPPER CU 64 DOTATATE 1 MCI/ML IV SOLN
4.0000 | Freq: Once | INTRAVENOUS | Status: AC
  Administered 2023-06-13: 4.24 via INTRAVENOUS

## 2023-06-13 NOTE — Progress Notes (Signed)
 Patient presents today for Sandostatin  injection. Patient tolerated injection with no complaints voiced in left upper gluteal. Site clean and dry with no bruising or swelling noted.  No complaints of pain.  Discharged with vital signs stable and no signs or symptoms of distress noted.

## 2023-06-13 NOTE — Patient Instructions (Signed)
 [facility] - A DEPT OF [facility]  Discharge Instructions: Thank you for choosing [facility] to provide your oncology and hematology care.   If you have a lab appointment with the Cancer Center, please go directly to the Cancer Center and check in at the registration area.   Wear comfortable clothing and clothing appropriate for easy access to any Portacath or PICC line.   We strive to give you quality time with your provider. You may need to reschedule your appointment if you arrive late (15 or more minutes).  Arriving late affects you and other patients whose appointments are after yours.  Also, if you miss three or more appointments without notifying the office, you may be dismissed from the clinic at the provider's discretion.      For prescription refill requests, have your pharmacy contact our office and allow 72 hours for refills to be completed.    Today you received the following chemotherapy and/or immunotherapy agents Sandostatin  injection.  Octreotide  Injection Solution What is this medication? OCTREOTIDE  (ok TREE oh tide) treats high levels of growth hormone (acromegaly). It works by reducing the amount of growth hormone your body makes. This reduces symptoms and the risk of health problems caused by too much growth hormone, such as diabetes and heart disease. It may also be used to treat diarrhea caused by neuroendocrine tumors. It works by slowing down the release of serotonin from the tumor cells. This reduces the number of bowel movements you have. This medicine may be used for other purposes; ask your health care provider or pharmacist if you have questions. COMMON BRAND NAME(S): Bynfezia, Sandostatin  What should I tell my care team before I take this medication? They need to know if you have any of these conditions: Diabetes Gallbladder disease Heart disease Kidney disease Liver disease Pancreatic disease Thyroid  disease An unusual or allergic reaction to octreotide , other medications, foods, dyes, or preservatives Pregnant or trying to get pregnant Breastfeeding How should I use this medication? This medication is injected under the skin or into a vein. It is usually given by your care team in a hospital or clinic setting. If you get this medication at home, you will be taught how to prepare and give it. Use exactly as directed. Take it as directed on the prescription label at the same time every day. Keep taking it unless your care team tells you to stop. Allow the injection solution to come to room temperature before use. Do not warm it artificially. It is important that you put your used needles and syringes in a special sharps container. Do not put them in a trash can. If you do not have a sharps container, call your pharmacist or care team to get one. Talk to your care team about the use of this medication in children. Special care may be needed. Overdosage: If you think you have taken too much of this medicine contact a poison control center or emergency room at once. NOTE: This medicine is only for you. Do not share this medicine with others. What if I miss a dose? If you miss a dose, take it as soon as you can. If it is almost time for your next dose, take only that dose. Do not take double or extra doses. What may interact with this medication? Bromocriptine Certain medications for blood pressure, heart disease, irregular heartbeat Cyclosporine Diuretics Medications for diabetes, including insulin Quinidine This list may not describe all  possible interactions. Give your health care provider a list of all the medicines, herbs, non-prescription drugs, or dietary supplements you use. Also tell them if you smoke, drink alcohol, or use illegal drugs. Some items may interact with your medicine. What should I watch for while using this medication? Visit your care team for regular checks on your  progress. Tell your care team if your symptoms do not start to get better or if they get worse. To help reduce irritation at the injection site, use a different site for each injection and make sure the solution is at room temperature before use. This medication may cause decreases in blood sugar. Signs of low blood sugar include chills, cool, pale skin or cold sweats, drowsiness, extreme hunger, fast heartbeat, headache, nausea, nervousness or anxiety, shakiness, trembling, unsteadiness, tiredness, or weakness. Contact your care team right away if you experience any of these symptoms. This medication may increase blood sugar. The risk may be higher in patients who already have diabetes. Ask your care team what you can do to lower your risk of diabetes while taking this medication. You should make sure you get enough vitamin B12 while you are taking this medication. Discuss the foods you eat and the vitamins you take with your care team. What side effects may I notice from receiving this medication? Side effects that you should report to your care team as soon as possible: Allergic reactions--skin rash, itching, hives, swelling of the face, lips, tongue, or throat Gallbladder problems--severe stomach pain, nausea, vomiting, fever Heart rhythm changes--fast or irregular heartbeat, dizziness, feeling faint or lightheaded, chest pain, trouble breathing High blood sugar (hyperglycemia)--increased thirst or amount of urine, unusual weakness or fatigue, blurry vision Low blood sugar (hypoglycemia)--tremors or shaking, anxiety, sweating, cold or clammy skin, confusion, dizziness, rapid heartbeat Low thyroid levels (hypothyroidism)--unusual weakness or fatigue, increased sensitivity to cold, constipation, hair loss, dry skin, weight gain, feelings of depression Low vitamin B12 level--pain, tingling, or numbness in the hands or feet, muscle weakness, dizziness, confusion, trouble concentrating Oily or  light-colored stools, diarrhea, bloating, weight loss Pancreatitis--severe stomach pain that spreads to your back or gets worse after eating or when touched, fever, nausea, vomiting Slow heartbeat--dizziness, feeling faint or lightheaded, confusion, trouble breathing, unusual weakness or fatigue Side effects that usually do not require medical attention (report these to your care team if they continue or are bothersome): Diarrhea Dizziness Headache Nausea Pain, redness, or irritation at injection site Stomach pain This list may not describe all possible side effects. Call your doctor for medical advice about side effects. You may report side effects to FDA at 1-800-FDA-1088. Where should I keep my medication? Keep out of the reach of children and pets. Store in the refrigerator. Protect from light. Allow to come to room temperature naturally. Do not use artificial heat. If protected from light, the injection may be stored between 20 and 30 degrees C (70 and 86 degrees F) for 14 days. After the initial use, throw away any unused portion of a multiple dose vial after 14 days. Get rid of any unused portions of the ampules after use. To get rid of medications that are no longer needed or have expired: Take the medication to a medication take-back program. Ask your pharmacy or law enforcement to find a location. If you cannot return the medication, ask your pharmacist or care team how to get rid of the medication safely. NOTE: This sheet is a summary. It may not cover all possible information. If you  have questions about this medicine, talk to your doctor, pharmacist, or health care provider.  2024 Elsevier/Gold Standard (2022-12-06 00:00:00)   To help prevent nausea and vomiting after your treatment, we encourage you to take your nausea medication as directed.  BELOW ARE SYMPTOMS THAT SHOULD BE REPORTED IMMEDIATELY: *FEVER GREATER THAN 100.4 F (38 C) OR HIGHER *CHILLS OR SWEATING *NAUSEA AND  VOMITING THAT IS NOT CONTROLLED WITH YOUR NAUSEA MEDICATION *UNUSUAL SHORTNESS OF BREATH *UNUSUAL BRUISING OR BLEEDING *URINARY PROBLEMS (pain or burning when urinating, or frequent urination) *BOWEL PROBLEMS (unusual diarrhea, constipation, pain near the anus) TENDERNESS IN MOUTH AND THROAT WITH OR WITHOUT PRESENCE OF ULCERS (sore throat, sores in mouth, or a toothache) UNUSUAL RASH, SWELLING OR PAIN  UNUSUAL VAGINAL DISCHARGE OR ITCHING   Items with * indicate a potential emergency and should be followed up as soon as possible or go to the Emergency Department if any problems should occur.  Please show the CHEMOTHERAPY ALERT CARD or IMMUNOTHERAPY ALERT CARD at check-in to the Emergency Department and triage nurse.  Should you have questions after your visit or need to cancel or reschedule your appointment, please contact [facility]  Dept: [phone]  and follow the prompts.  Office hours are 8:00 a.m. to 4:30 p.m. Monday - Friday. Please note that voicemails left after 4:00 p.m. may not be returned until the following business day.  We are closed weekends and major holidays. You have access to a nurse at all times for urgent questions. Please call the main number to the clinic Dept: [phone] and follow the prompts.   For any non-urgent questions, you may also contact your provider using MyChart. We now offer e-Visits for anyone 23 and older to request care online for non-urgent symptoms. For details visit [email or web address].   Also download the MyChart app! Go to the app store, search "MyChart", open the app, select [place], and log in with your MyChart username and password.

## 2023-06-15 LAB — CHROMOGRANIN A: Chromogranin A (ng/mL): 76.7 ng/mL (ref 0.0–101.8)

## 2023-06-16 ENCOUNTER — Telehealth: Payer: Self-pay

## 2023-06-16 ENCOUNTER — Encounter: Payer: Self-pay | Admitting: Oncology

## 2023-06-16 NOTE — Telephone Encounter (Signed)
 Patient called requesting results from recent scan completed on 06/13/23. Patient informed that it is taking ~7 days for scans to be read and she will be called with results once they become available.  Message left with RN to call patient with results or update on status on Friday, 6/13. Patient agreeable to the plan.

## 2023-06-20 ENCOUNTER — Telehealth: Payer: Self-pay

## 2023-06-20 NOTE — Telephone Encounter (Signed)
 The patient called requesting a scan review. I informed the patient that it typically takes up to two weeks to have a scan interpreted. I will contact the reading room to request an expedited reading. MJ from the reading room stated that the scan has been placed on the urgent list for review. The patient mentioned that a nurse had advised her it would be approximately one week. I explained to her that we cannot guarantee when the scan will be read, but we have asked the reading room to prioritize it.

## 2023-06-25 ENCOUNTER — Encounter: Payer: Self-pay | Admitting: Oncology

## 2023-07-14 ENCOUNTER — Ambulatory Visit: Admitting: Oncology

## 2023-07-14 ENCOUNTER — Ambulatory Visit

## 2023-07-15 ENCOUNTER — Encounter: Payer: Self-pay | Admitting: Nurse Practitioner

## 2023-07-15 ENCOUNTER — Inpatient Hospital Stay: Attending: Oncology | Admitting: Nurse Practitioner

## 2023-07-15 ENCOUNTER — Inpatient Hospital Stay

## 2023-07-15 VITALS — BP 112/72 | HR 60 | Temp 97.9°F | Resp 18 | Ht 65.0 in | Wt 132.7 lb

## 2023-07-15 DIAGNOSIS — D509 Iron deficiency anemia, unspecified: Secondary | ICD-10-CM | POA: Diagnosis not present

## 2023-07-15 DIAGNOSIS — C7A019 Malignant carcinoid tumor of the small intestine, unspecified portion: Secondary | ICD-10-CM | POA: Insufficient documentation

## 2023-07-15 MED ORDER — OCTREOTIDE ACETATE 30 MG IM KIT
30.0000 mg | PACK | Freq: Once | INTRAMUSCULAR | Status: AC
  Administered 2023-07-15: 30 mg via INTRAMUSCULAR
  Filled 2023-07-15: qty 1

## 2023-07-15 NOTE — Progress Notes (Signed)
 [facility] OFFICE PROGRESS NOTE   Diagnosis: Carcinoid tumor  INTERVAL HISTORY:   Brittney [name] returns as scheduled.  She continues monthly Sandostatin .  Bowels moving regularly overall.  No consistent diarrhea.  No flushing.  No abdominal pain.  She has infrequent nausea.  She thinks she may have a ganglion cyst at the left wrist.  No associated pain.  Objective:  Vital signs in last 24 hours:  Blood pressure 112/72, pulse 60, temperature 97.9 F (36.6 C), resp. rate 18, height 5' 5 (1.651 m), weight 132 lb 11.2 oz (60.2 kg), last menstrual period 01/31/2014, SpO2 100%.    Lymphatics: No palpable cervical, supraclavicular, axillary or inguinal lymph nodes. Resp: Lungs clear bilaterally. Cardio: Regular rate and rhythm. GI: No hepatosplenomegaly.  No mass.  Nontender. Vascular: No leg edema. Musculoskeletal: Cystic lesion left wrist.  Lab Results:  Lab Results  Component Value Date   WBC 5.3 02/14/2023   HGB 14.1 02/14/2023   HCT 43.3 02/14/2023   MCV 91.7 02/14/2023   PLT 219 02/14/2023   NEUTROABS 2.5 02/14/2023    Imaging:  No results found.  Medications: I have reviewed the patient's current medications.  Assessment/Plan: Metastatic neuroendocrine tumor MRI of the abdomen 10/02/2016 confirmed multiple enhancing liver lesions consistent with metastases, no primary tumor site identified Ultrasound-guided biopsy of a left liver lesion 10/07/2016-neuroendocrine neoplasm, intermediate grade , review of pathology at GI tumor conference consistent with a low-grade carcinoid tumor Elevated chromogranin A 10/15/2016  gallium-DOTATATE scan 10/23/2016-multiple foci of liver metastases, enlarged central mesenteric lymph nodes, short intussusception's in adjacent small bowel felt to represent a primary small bowel tumor, additional mesenteric lymph nodes with metabolic activity, deep right pelvic nodule consistent with a pelvic metastasis Small bowel  resection 11/21/2016- mid ileum mass, low-grade neuroendocrine tumor,pT4,pN2, 6/18 lymph nodes positive, positive mesenteric resection margin (lymph node), intramural satellite nodule CT 11/30/2016-small bowel obstruction with transition point adjacent to suture line at the distal small bowel, liver metastases similar to the 10/02/2016 MRI Recurrent obstructive symptoms requiring repeat surgery with resection of the small bowel anastomosis site 12/13/2016, pathology negative for malignancy Initiation of monthly Sandostatin  12/20/2016 CT abdomen/pelvis 02/19/2017-mild enlargement of liver lesions compared to November 2018 Cycle 1 Lutathera  06/17/2017 Cycle 2 Lutathera  08/13/2017 Cycle 3 Lutathera  10/07/2017 Cycle 4 Lutathera  12/02/2017 Dotatate scan 12/29/2017-no evidence of new or metastatic disease or progression.  Multiple hepatic metastasis.  Lesions have decreased in size from the most recent CT scan and slightly increased in radiotracer activity from dotatate PET scan 10/23/2016.  Interval resection of small bowel tumor and bulky mesenteric metastasis.  No residual activity in the bowel or mesentery.  Single small focus of activity in the deep right pelvis along the peritoneal surface not changed from comparison exam. Monthly Sandostatin  continued Netspot  06/23/2018- mild decrease in radiotracer activity of all hepatic metastases, no new lesions, slight enlargement of several liver lesions Monthly Sandostatin  continued Netspot  03/31/2019-no new liver lesions, no change in the size of liver lesions with a mild decrease in tracer activity involving several lesions, stable right pelvic peritoneal implant Monthly Sandostatin  continued Netspot  12/07/2019-no new liver lesion, focus of activity left facet joint at C4-C5 has increased, intense activity associated with fractures of the left ninth and 10th ribs, unchanged size and number of liver lesions with increase in radiotracer activity-potentially indicating  mild progression Netspot  08/14/2020-decrease in radiotracer activity associated with liver lesions, no new lesions, the liver lesions have not enlarged, decreased radiotracer activity associated with a small right pelvic  peritoneal implant, decreased activity associated with degenerative changes in the cervical and thoracic spine, no evidence of progressive disease Monthly Sandostatin  continued Netspot  05/28/2021-stable multifocal radiotracer avid liver metastases, no increase in size, no new liver lesions, stable small deep right pelvic implant, no evidence of progressive disease Monthly Sandostatin  continued Netspot  05/10/2022-no change in size or radiotracer activity associated with liver metastases, no new lesion, stable small peritoneal lesion in the right pelvis Monthly Sandostatin  continued Dotatate PET scan 06/13/2023-near identical pattern of well-differentiated neuroendocrine tumor metastasis within the liver.  No evidence of disease progression.  Single small peritoneal implant deep right pelvis unchanged.  No new or progressive disease Monthly Sandostatin  continued   2.  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  Intermittent abdominal bloating and left-sided abdominal pain- resolved   4.  Admission 11/30/2016 with a small bowel obstruction    Disposition: Brittney [name] appears stable.  Recent dotatate PET scan was stable, no evidence of disease progression.  Plan to continue with monthly Sandostatin .  Probable cyst left wrist.  She is asymptomatic.  She will follow-up with PCP if she develops symptoms.  Next office visit in 3 months.  We are available to see her sooner if needed.  Patient seen with Dr. Cloretta.  Olam Ned ANP/GNP-BC   07/15/2023  1:26 PM  This was a shared visit with Olam Ned.  Brittney [name] continues monthly Sandostatin .  We reviewed the restaging Dotatate findings and images with her.  The  PET is consistent with stable disease.  The plan is to continue Sandostatin .  I was present for greater than 50% of today's visit.  I performed medical decision making.  Arvella Cloretta, MD

## 2023-07-15 NOTE — Patient Instructions (Signed)
 [facility] - A DEPT OF [facility]  Discharge Instructions: Thank you for choosing [facility] to provide your oncology and hematology care.   If you have a lab appointment with the Cancer Center, please go directly to the Cancer Center and check in at the registration area.   Wear comfortable clothing and clothing appropriate for easy access to any Portacath or PICC line.   We strive to give you quality time with your provider. You may need to reschedule your appointment if you arrive late (15 or more minutes).  Arriving late affects you and other patients whose appointments are after yours.  Also, if you miss three or more appointments without notifying the office, you may be dismissed from the clinic at the provider's discretion.      For prescription refill requests, have your pharmacy contact our office and allow 72 hours for refills to be completed.    Today you received the following Sandostatin  injection.  Octreotide  Injection Solution What is this medication? OCTREOTIDE  (ok TREE oh tide) treats high levels of growth hormone (acromegaly). It works by reducing the amount of growth hormone your body makes. This reduces symptoms and the risk of health problems caused by too much growth hormone, such as diabetes and heart disease. It may also be used to treat diarrhea caused by neuroendocrine tumors. It works by slowing down the release of serotonin from the tumor cells. This reduces the number of bowel movements you have. This medicine may be used for other purposes; ask your health care provider or pharmacist if you have questions. COMMON BRAND NAME(S): Bynfezia , Sandostatin  What should I tell my care team before I take this medication? They need to know if you have any of these conditions: Diabetes Gallbladder disease Heart disease Kidney disease Liver disease Pancreatic disease Thyroid disease An unusual or allergic reaction to  octreotide , other medications, foods, dyes, or preservatives Pregnant or trying to get pregnant Breastfeeding How should I use this medication? This medication is injected under the skin or into a vein. It is usually given by your care team in a hospital or clinic setting. If you get this medication at home, you will be taught how to prepare and give it. Use exactly as directed. Take it as directed on the prescription label at the same time every day. Keep taking it unless your care team tells you to stop. Allow the injection solution to come to room temperature before use. Do not warm it artificially. It is important that you put your used needles and syringes in a special sharps container. Do not put them in a trash can. If you do not have a sharps container, call your pharmacist or care team to get one. Talk to your care team about the use of this medication in children. Special care may be needed. Overdosage: If you think you have taken too much of this medicine contact a poison control center or emergency room at once. NOTE: This medicine is only for you. Do not share this medicine with others. What if I miss a dose? If you miss a dose, take it as soon as you can. If it is almost time for your next dose, take only that dose. Do not take double or extra doses. What may interact with this medication? Bromocriptine Certain medications for blood pressure, heart disease, irregular heartbeat Cyclosporine Diuretics Medications for diabetes, including insulin Quinidine This list may not describe all possible interactions. Give your  health care provider a list of all the medicines, herbs, non-prescription drugs, or dietary supplements you use. Also tell them if you smoke, drink alcohol, or use illegal drugs. Some items may interact with your medicine. What should I watch for while using this medication? Visit your care team for regular checks on your progress. Tell your care team if your symptoms  do not start to get better or if they get worse. To help reduce irritation at the injection site, use a different site for each injection and make sure the solution is at room temperature before use. This medication may cause decreases in blood sugar. Signs of low blood sugar include chills, cool, pale skin or cold sweats, drowsiness, extreme hunger, fast heartbeat, headache, nausea, nervousness or anxiety, shakiness, trembling, unsteadiness, tiredness, or weakness. Contact your care team right away if you experience any of these symptoms. This medication may increase blood sugar. The risk may be higher in patients who already have diabetes. Ask your care team what you can do to lower your risk of diabetes while taking this medication. You should make sure you get enough vitamin B12 while you are taking this medication. Discuss the foods you eat and the vitamins you take with your care team. What side effects may I notice from receiving this medication? Side effects that you should report to your care team as soon as possible: Allergic reactions--skin rash, itching, hives, swelling of the face, lips, tongue, or throat Gallbladder problems--severe stomach pain, nausea, vomiting, fever Heart rhythm changes--fast or irregular heartbeat, dizziness, feeling faint or lightheaded, chest pain, trouble breathing High blood sugar (hyperglycemia)--increased thirst or amount of urine, unusual weakness or fatigue, blurry vision Low blood sugar (hypoglycemia)--tremors or shaking, anxiety, sweating, cold or clammy skin, confusion, dizziness, rapid heartbeat Low thyroid levels (hypothyroidism)--unusual weakness or fatigue, increased sensitivity to cold, constipation, hair loss, dry skin, weight gain, feelings of depression Low vitamin B12 level--pain, tingling, or numbness in the hands or feet, muscle weakness, dizziness, confusion, trouble concentrating Oily or light-colored stools, diarrhea, bloating, weight  loss Pancreatitis--severe stomach pain that spreads to your back or gets worse after eating or when touched, fever, nausea, vomiting Slow heartbeat--dizziness, feeling faint or lightheaded, confusion, trouble breathing, unusual weakness or fatigue Side effects that usually do not require medical attention (report these to your care team if they continue or are bothersome): Diarrhea Dizziness Headache Nausea Pain, redness, or irritation at injection site Stomach pain This list may not describe all possible side effects. Call your doctor for medical advice about side effects. You may report side effects to FDA at 1-800-FDA-1088. Where should I keep my medication? Keep out of the reach of children and pets. Store in the refrigerator. Protect from light. Allow to come to room temperature naturally. Do not use artificial heat. If protected from light, the injection may be stored between 20 and 30 degrees C (70 and 86 degrees F) for 14 days. After the initial use, throw away any unused portion of a multiple dose vial after 14 days. Get rid of any unused portions of the ampules after use. To get rid of medications that are no longer needed or have expired: Take the medication to a medication take-back program. Ask your pharmacy or law enforcement to find a location. If you cannot return the medication, ask your pharmacist or care team how to get rid of the medication safely. NOTE: This sheet is a summary. It may not cover all possible information. If you have questions about this  medicine, talk to your doctor, pharmacist, or health care provider.  2024 Elsevier/Gold Standard (2022-12-06 00:00:00)  To help prevent nausea and vomiting after your treatment, we encourage you to take your nausea medication as directed.  BELOW ARE SYMPTOMS THAT SHOULD BE REPORTED IMMEDIATELY: *FEVER GREATER THAN 100.4 F (38 C) OR HIGHER *CHILLS OR SWEATING *NAUSEA AND VOMITING THAT IS NOT CONTROLLED WITH YOUR NAUSEA  MEDICATION *UNUSUAL SHORTNESS OF BREATH *UNUSUAL BRUISING OR BLEEDING *URINARY PROBLEMS (pain or burning when urinating, or frequent urination) *BOWEL PROBLEMS (unusual diarrhea, constipation, pain near the anus) TENDERNESS IN MOUTH AND THROAT WITH OR WITHOUT PRESENCE OF ULCERS (sore throat, sores in mouth, or a toothache) UNUSUAL RASH, SWELLING OR PAIN  UNUSUAL VAGINAL DISCHARGE OR ITCHING   Items with * indicate a potential emergency and should be followed up as soon as possible or go to the Emergency Department if any problems should occur.  Please show the CHEMOTHERAPY ALERT CARD or IMMUNOTHERAPY ALERT CARD at check-in to the Emergency Department and triage nurse.  Should you have questions after your visit or need to cancel or reschedule your appointment, please contact [facility] - A DEPT OF [facility]  Dept: [phone]  and follow the prompts.  Office hours are 8:00 a.m. to 4:30 p.m. Monday - Friday. Please note that voicemails left after 4:00 p.m. may not be returned until the following business day.  We are closed weekends and major holidays. You have access to a nurse at all times for urgent questions. Please call the main number to the clinic Dept: [phone] and follow the prompts.   For any non-urgent questions, you may also contact your provider using MyChart. We now offer e-Visits for anyone 46 and older to request care online for non-urgent symptoms. For details visit [email or web address].   Also download the MyChart app! Go to the app store, search MyChart, open the app, select [place], and log in with your MyChart username and password.

## 2023-07-15 NOTE — Progress Notes (Signed)
 Patient presents today for Sandostatin  injection. Patient tolerated injection in right gluteal. with no complaints voiced.  Site clean and dry with no bruising or swelling noted.  No complaints of pain.

## 2023-08-15 ENCOUNTER — Inpatient Hospital Stay: Attending: Oncology

## 2023-08-15 VITALS — BP 103/66 | HR 66 | Temp 97.7°F | Resp 20

## 2023-08-15 DIAGNOSIS — C7A019 Malignant carcinoid tumor of the small intestine, unspecified portion: Secondary | ICD-10-CM | POA: Insufficient documentation

## 2023-08-15 DIAGNOSIS — Z79899 Other long term (current) drug therapy: Secondary | ICD-10-CM | POA: Insufficient documentation

## 2023-08-15 MED ORDER — OCTREOTIDE ACETATE 30 MG IM KIT
30.0000 mg | PACK | Freq: Once | INTRAMUSCULAR | Status: AC
Start: 2023-08-15 — End: 2023-08-15
  Administered 2023-08-15: 30 mg via INTRAMUSCULAR
  Filled 2023-08-15: qty 1

## 2023-09-01 ENCOUNTER — Encounter: Payer: Self-pay | Admitting: Oncology

## 2023-09-02 ENCOUNTER — Telehealth: Payer: Self-pay | Admitting: Oncology

## 2023-09-02 NOTE — Telephone Encounter (Signed)
 PT called to rearrange appt times and dates. Appts time and dates confirmed.

## 2023-09-15 ENCOUNTER — Ambulatory Visit

## 2023-09-15 ENCOUNTER — Inpatient Hospital Stay

## 2023-09-15 ENCOUNTER — Other Ambulatory Visit

## 2023-09-15 ENCOUNTER — Inpatient Hospital Stay: Attending: Oncology

## 2023-09-15 VITALS — BP 109/81 | HR 61 | Temp 98.2°F | Resp 18

## 2023-09-15 DIAGNOSIS — C7A019 Malignant carcinoid tumor of the small intestine, unspecified portion: Secondary | ICD-10-CM

## 2023-09-15 DIAGNOSIS — C7B02 Secondary carcinoid tumors of liver: Secondary | ICD-10-CM | POA: Insufficient documentation

## 2023-09-15 LAB — CMP (CANCER CENTER ONLY)
ALT: 15 U/L (ref 0–44)
AST: 27 U/L (ref 15–41)
Albumin: 4.1 g/dL (ref 3.5–5.0)
Alkaline Phosphatase: 83 U/L (ref 38–126)
Anion gap: 11 (ref 5–15)
BUN: 13 mg/dL (ref 6–20)
CO2: 26 mmol/L (ref 22–32)
Calcium: 9.7 mg/dL (ref 8.9–10.3)
Chloride: 102 mmol/L (ref 98–111)
Creatinine: 0.7 mg/dL (ref 0.44–1.00)
GFR, Estimated: >60 mL/min (ref >60)
Glucose, Bld: 89 mg/dL (ref 70–99)
Potassium: 4.1 mmol/L (ref 3.5–5.1)
Sodium: 140 mmol/L (ref 135–145)
Total Bilirubin: 0.5 mg/dL (ref 0.0–1.2)
Total Protein: 7 g/dL (ref 6.5–8.1)

## 2023-09-15 LAB — CBC WITH DIFFERENTIAL (CANCER CENTER ONLY)
Abs Immature Granulocytes: 0.01 K/uL (ref 0.00–0.07)
Basophils Absolute: 0 K/uL (ref 0.0–0.1)
Basophils Relative: 0 %
Eosinophils Absolute: 0.3 K/uL (ref 0.0–0.5)
Eosinophils Relative: 6 %
HCT: 41.9 % (ref 36.0–46.0)
Hemoglobin: 13.6 g/dL (ref 12.0–15.0)
Immature Granulocytes: 0 %
Lymphocytes Relative: 40 %
Lymphs Abs: 1.8 K/uL (ref 0.7–4.0)
MCH: 29.4 pg (ref 26.0–34.0)
MCHC: 32.5 g/dL (ref 30.0–36.0)
MCV: 90.7 fL (ref 80.0–100.0)
Monocytes Absolute: 0.3 K/uL (ref 0.1–1.0)
Monocytes Relative: 6 %
Neutro Abs: 2.2 K/uL (ref 1.7–7.7)
Neutrophils Relative %: 48 %
Platelet Count: 221 K/uL (ref 150–400)
RBC: 4.62 MIL/uL (ref 3.87–5.11)
RDW: 12.9 % (ref 11.5–15.5)
WBC Count: 4.6 K/uL (ref 4.0–10.5)
nRBC: 0 % (ref 0.0–0.2)

## 2023-09-15 MED ORDER — OCTREOTIDE ACETATE 30 MG IM KIT
30.0000 mg | PACK | Freq: Once | INTRAMUSCULAR | Status: AC
  Administered 2023-09-15: 30 mg via INTRAMUSCULAR
  Filled 2023-09-15: qty 1

## 2023-09-15 NOTE — Patient Instructions (Signed)
 [facility] - A DEPT OF [facility]  Discharge Instructions: Thank you for choosing [facility] to provide your oncology and hematology care.   If you have a lab appointment with the Cancer Center, please go directly to the Cancer Center and check in at the registration area.   Wear comfortable clothing and clothing appropriate for easy access to any Portacath or PICC line.   We strive to give you quality time with your provider. You may need to reschedule your appointment if you arrive late (15 or more minutes).  Arriving late affects you and other patients whose appointments are after yours.  Also, if you miss three or more appointments without notifying the office, you may be dismissed from the clinic at the provider's discretion.      For prescription refill requests, have your pharmacy contact our office and allow 72 hours for refills to be completed.    Today you received the following Sandostatin  injection.  Octreotide  Injection Solution What is this medication? OCTREOTIDE  (ok TREE oh tide) treats high levels of growth hormone (acromegaly). It works by reducing the amount of growth hormone your body makes. This reduces symptoms and the risk of health problems caused by too much growth hormone, such as diabetes and heart disease. It may also be used to treat diarrhea caused by neuroendocrine tumors. It works by slowing down the release of serotonin from the tumor cells. This reduces the number of bowel movements you have. This medicine may be used for other purposes; ask your health care provider or pharmacist if you have questions. COMMON BRAND NAME(S): Bynfezia , Sandostatin  What should I tell my care team before I take this medication? They need to know if you have any of these conditions: Diabetes Gallbladder disease Heart disease Kidney disease Liver disease Pancreatic disease Thyroid disease An unusual or allergic reaction to  octreotide , other medications, foods, dyes, or preservatives Pregnant or trying to get pregnant Breastfeeding How should I use this medication? This medication is injected under the skin or into a vein. It is usually given by your care team in a hospital or clinic setting. If you get this medication at home, you will be taught how to prepare and give it. Use exactly as directed. Take it as directed on the prescription label at the same time every day. Keep taking it unless your care team tells you to stop. Allow the injection solution to come to room temperature before use. Do not warm it artificially. It is important that you put your used needles and syringes in a special sharps container. Do not put them in a trash can. If you do not have a sharps container, call your pharmacist or care team to get one. Talk to your care team about the use of this medication in children. Special care may be needed. Overdosage: If you think you have taken too much of this medicine contact a poison control center or emergency room at once. NOTE: This medicine is only for you. Do not share this medicine with others. What if I miss a dose? If you miss a dose, take it as soon as you can. If it is almost time for your next dose, take only that dose. Do not take double or extra doses. What may interact with this medication? Bromocriptine Certain medications for blood pressure, heart disease, irregular heartbeat Cyclosporine Diuretics Medications for diabetes, including insulin Quinidine This list may not describe all possible interactions. Give your  health care provider a list of all the medicines, herbs, non-prescription drugs, or dietary supplements you use. Also tell them if you smoke, drink alcohol, or use illegal drugs. Some items may interact with your medicine. What should I watch for while using this medication? Visit your care team for regular checks on your progress. Tell your care team if your symptoms  do not start to get better or if they get worse. To help reduce irritation at the injection site, use a different site for each injection and make sure the solution is at room temperature before use. This medication may cause decreases in blood sugar. Signs of low blood sugar include chills, cool, pale skin or cold sweats, drowsiness, extreme hunger, fast heartbeat, headache, nausea, nervousness or anxiety, shakiness, trembling, unsteadiness, tiredness, or weakness. Contact your care team right away if you experience any of these symptoms. This medication may increase blood sugar. The risk may be higher in patients who already have diabetes. Ask your care team what you can do to lower your risk of diabetes while taking this medication. You should make sure you get enough vitamin B12 while you are taking this medication. Discuss the foods you eat and the vitamins you take with your care team. What side effects may I notice from receiving this medication? Side effects that you should report to your care team as soon as possible: Allergic reactions--skin rash, itching, hives, swelling of the face, lips, tongue, or throat Gallbladder problems--severe stomach pain, nausea, vomiting, fever Heart rhythm changes--fast or irregular heartbeat, dizziness, feeling faint or lightheaded, chest pain, trouble breathing High blood sugar (hyperglycemia)--increased thirst or amount of urine, unusual weakness or fatigue, blurry vision Low blood sugar (hypoglycemia)--tremors or shaking, anxiety, sweating, cold or clammy skin, confusion, dizziness, rapid heartbeat Low thyroid levels (hypothyroidism)--unusual weakness or fatigue, increased sensitivity to cold, constipation, hair loss, dry skin, weight gain, feelings of depression Low vitamin B12 level--pain, tingling, or numbness in the hands or feet, muscle weakness, dizziness, confusion, trouble concentrating Oily or light-colored stools, diarrhea, bloating, weight  loss Pancreatitis--severe stomach pain that spreads to your back or gets worse after eating or when touched, fever, nausea, vomiting Slow heartbeat--dizziness, feeling faint or lightheaded, confusion, trouble breathing, unusual weakness or fatigue Side effects that usually do not require medical attention (report these to your care team if they continue or are bothersome): Diarrhea Dizziness Headache Nausea Pain, redness, or irritation at injection site Stomach pain This list may not describe all possible side effects. Call your doctor for medical advice about side effects. You may report side effects to FDA at 1-800-FDA-1088. Where should I keep my medication? Keep out of the reach of children and pets. Store in the refrigerator. Protect from light. Allow to come to room temperature naturally. Do not use artificial heat. If protected from light, the injection may be stored between 20 and 30 degrees C (70 and 86 degrees F) for 14 days. After the initial use, throw away any unused portion of a multiple dose vial after 14 days. Get rid of any unused portions of the ampules after use. To get rid of medications that are no longer needed or have expired: Take the medication to a medication take-back program. Ask your pharmacy or law enforcement to find a location. If you cannot return the medication, ask your pharmacist or care team how to get rid of the medication safely. NOTE: This sheet is a summary. It may not cover all possible information. If you have questions about this  medicine, talk to your doctor, pharmacist, or health care provider.  2024 Elsevier/Gold Standard (2022-12-06 00:00:00)  To help prevent nausea and vomiting after your treatment, we encourage you to take your nausea medication as directed.  BELOW ARE SYMPTOMS THAT SHOULD BE REPORTED IMMEDIATELY: *FEVER GREATER THAN 100.4 F (38 C) OR HIGHER *CHILLS OR SWEATING *NAUSEA AND VOMITING THAT IS NOT CONTROLLED WITH YOUR NAUSEA  MEDICATION *UNUSUAL SHORTNESS OF BREATH *UNUSUAL BRUISING OR BLEEDING *URINARY PROBLEMS (pain or burning when urinating, or frequent urination) *BOWEL PROBLEMS (unusual diarrhea, constipation, pain near the anus) TENDERNESS IN MOUTH AND THROAT WITH OR WITHOUT PRESENCE OF ULCERS (sore throat, sores in mouth, or a toothache) UNUSUAL RASH, SWELLING OR PAIN  UNUSUAL VAGINAL DISCHARGE OR ITCHING   Items with * indicate a potential emergency and should be followed up as soon as possible or go to the Emergency Department if any problems should occur.  Please show the CHEMOTHERAPY ALERT CARD or IMMUNOTHERAPY ALERT CARD at check-in to the Emergency Department and triage nurse.  Should you have questions after your visit or need to cancel or reschedule your appointment, please contact [facility] - A DEPT OF [facility]  Dept: [phone]  and follow the prompts.  Office hours are 8:00 a.m. to 4:30 p.m. Monday - Friday. Please note that voicemails left after 4:00 p.m. may not be returned until the following business day.  We are closed weekends and major holidays. You have access to a nurse at all times for urgent questions. Please call the main number to the clinic Dept: [phone] and follow the prompts.   For any non-urgent questions, you may also contact your provider using MyChart. We now offer e-Visits for anyone 46 and older to request care online for non-urgent symptoms. For details visit [email or web address].   Also download the MyChart app! Go to the app store, search MyChart, open the app, select [place], and log in with your MyChart username and password.

## 2023-09-16 LAB — CHROMOGRANIN A: Chromogranin A (ng/mL): 56.1 ng/mL (ref 0.0–101.8)

## 2023-09-18 ENCOUNTER — Other Ambulatory Visit

## 2023-09-18 ENCOUNTER — Ambulatory Visit

## 2023-10-14 ENCOUNTER — Ambulatory Visit

## 2023-10-14 ENCOUNTER — Ambulatory Visit: Admitting: Oncology

## 2023-10-20 ENCOUNTER — Inpatient Hospital Stay: Attending: Oncology | Admitting: Oncology

## 2023-10-20 ENCOUNTER — Inpatient Hospital Stay

## 2023-10-20 VITALS — BP 97/64 | HR 71 | Temp 97.8°F | Resp 18 | Ht 65.0 in | Wt 130.5 lb

## 2023-10-20 DIAGNOSIS — Z79899 Other long term (current) drug therapy: Secondary | ICD-10-CM | POA: Insufficient documentation

## 2023-10-20 DIAGNOSIS — D509 Iron deficiency anemia, unspecified: Secondary | ICD-10-CM | POA: Insufficient documentation

## 2023-10-20 DIAGNOSIS — C7A019 Malignant carcinoid tumor of the small intestine, unspecified portion: Secondary | ICD-10-CM | POA: Insufficient documentation

## 2023-10-20 MED ORDER — OCTREOTIDE ACETATE 30 MG IM KIT
30.0000 mg | PACK | Freq: Once | INTRAMUSCULAR | Status: AC
  Administered 2023-10-20: 30 mg via INTRAMUSCULAR
  Filled 2023-10-20: qty 1

## 2023-10-20 NOTE — Progress Notes (Signed)
 [facility] OFFICE PROGRESS NOTE   Diagnosis: Carcinoid tumor  INTERVAL HISTORY:   Brittney [name] returns as scheduled.  She continues monthly Sandostatin .  No flushing or diarrhea.  No abdominal pain.  Good energy level.  She has intermittent episodes of abnormal balance lasting for seconds.  No vision change.  She wonders whether the balance episodes are related to a recent change in her glasses.  Objective:  Vital signs in last 24 hours:  Blood pressure 97/64, pulse 71, temperature 97.8 F (36.6 C), temperature source Temporal, resp. rate 18, height 5' 5 (1.651 m), weight 130 lb 8 oz (59.2 kg), last menstrual period 01/31/2014, SpO2 100%.   Resp: Lungs clear bilaterally Cardio: Regular rate and rhythm GI: No hepatosplenomegaly no mass, nontender Vascular: No leg edema   Lab Results:  Lab Results  Component Value Date   WBC 4.6 09/15/2023   HGB 13.6 09/15/2023   HCT 41.9 09/15/2023   MCV 90.7 09/15/2023   PLT 221 09/15/2023   NEUTROABS 2.2 09/15/2023    CMP  Lab Results  Component Value Date   NA 140 09/15/2023   K 4.1 09/15/2023   CL 102 09/15/2023   CO2 26 09/15/2023   GLUCOSE 89 09/15/2023   BUN 13 09/15/2023   CREATININE 0.70 09/15/2023   CALCIUM 9.7 09/15/2023   PROT 7.0 09/15/2023   ALBUMIN 4.1 09/15/2023   AST 27 09/15/2023   ALT 15 09/15/2023   ALKPHOS 83 09/15/2023   BILITOT 0.5 09/15/2023   GFRNONAA >60 09/15/2023   GFRAA >60 01/06/2019    No results found for: CEA1, CEA, CAN199, CA125  Lab Results  Component Value Date   INR 0.95 10/07/2016   LABPROT 12.6 10/07/2016    Imaging:  No results found.  Medications: I have reviewed the patient's current medications.   Assessment/Plan: Metastatic neuroendocrine tumor MRI of the abdomen 10/02/2016 confirmed multiple enhancing liver lesions consistent with metastases, no primary tumor site identified Ultrasound-guided biopsy of a left liver lesion  10/07/2016-neuroendocrine neoplasm, intermediate grade , review of pathology at GI tumor conference consistent with a low-grade carcinoid tumor Elevated chromogranin A 10/15/2016  gallium-DOTATATE scan 10/23/2016-multiple foci of liver metastases, enlarged central mesenteric lymph nodes, short intussusception's in adjacent small bowel felt to represent a primary small bowel tumor, additional mesenteric lymph nodes with metabolic activity, deep right pelvic nodule consistent with a pelvic metastasis Small bowel resection 11/21/2016- mid ileum mass, low-grade neuroendocrine tumor,pT4,pN2, 6/18 lymph nodes positive, positive mesenteric resection margin (lymph node), intramural satellite nodule CT 11/30/2016-small bowel obstruction with transition point adjacent to suture line at the distal small bowel, liver metastases similar to the 10/02/2016 MRI Recurrent obstructive symptoms requiring repeat surgery with resection of the small bowel anastomosis site 12/13/2016, pathology negative for malignancy Initiation of monthly Sandostatin  12/20/2016 CT abdomen/pelvis 02/19/2017-mild enlargement of liver lesions compared to November 2018 Cycle 1 Lutathera  06/17/2017 Cycle 2 Lutathera  08/13/2017 Cycle 3 Lutathera  10/07/2017 Cycle 4 Lutathera  12/02/2017 Dotatate scan 12/29/2017-no evidence of new or metastatic disease or progression.  Multiple hepatic metastasis.  Lesions have decreased in size from the most recent CT scan and slightly increased in radiotracer activity from dotatate PET scan 10/23/2016.  Interval resection of small bowel tumor and bulky mesenteric metastasis.  No residual activity in the bowel or mesentery.  Single small focus of activity in the deep right pelvis along the peritoneal surface not changed from comparison exam. Monthly Sandostatin  continued Netspot  06/23/2018- mild decrease in radiotracer activity of all hepatic metastases, no  new lesions, slight enlargement of several liver  lesions Monthly Sandostatin  continued Netspot  03/31/2019-no new liver lesions, no change in the size of liver lesions with a mild decrease in tracer activity involving several lesions, stable right pelvic peritoneal implant Monthly Sandostatin  continued Netspot  12/07/2019-no new liver lesion, focus of activity left facet joint at C4-C5 has increased, intense activity associated with fractures of the left ninth and 10th ribs, unchanged size and number of liver lesions with increase in radiotracer activity-potentially indicating mild progression Netspot  08/14/2020-decrease in radiotracer activity associated with liver lesions, no new lesions, the liver lesions have not enlarged, decreased radiotracer activity associated with a small right pelvic peritoneal implant, decreased activity associated with degenerative changes in the cervical and thoracic spine, no evidence of progressive disease Monthly Sandostatin  continued Netspot  05/28/2021-stable multifocal radiotracer avid liver metastases, no increase in size, no new liver lesions, stable small deep right pelvic implant, no evidence of progressive disease Monthly Sandostatin  continued Netspot  05/10/2022-no change in size or radiotracer activity associated with liver metastases, no new lesion, stable small peritoneal lesion in the right pelvis Monthly Sandostatin  continued Dotatate PET scan 06/13/2023-near identical pattern of well-differentiated neuroendocrine tumor metastasis within the liver.  No evidence of disease progression.  Single small peritoneal implant deep right pelvis unchanged.  No new or progressive disease Monthly Sandostatin  continued   2.  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  Intermittent abdominal bloating and left-sided abdominal pain- resolved   4.  Admission 11/30/2016 with a small bowel obstruction     Disposition: Brittney [name] appears unchanged.  She  continues treatment with monthly Sandostatin .  The chromogranin a level was stable last month.  She will continue monthly Sandostatin .  She will reschedule her office visit and chromogranin A in 4 months.  Arley Hof, MD  10/20/2023  12:24 PM

## 2023-11-14 ENCOUNTER — Inpatient Hospital Stay: Attending: Oncology

## 2023-11-14 VITALS — BP 107/68 | HR 63 | Temp 98.2°F | Resp 18

## 2023-11-14 DIAGNOSIS — Z79899 Other long term (current) drug therapy: Secondary | ICD-10-CM | POA: Insufficient documentation

## 2023-11-14 DIAGNOSIS — C7A019 Malignant carcinoid tumor of the small intestine, unspecified portion: Secondary | ICD-10-CM | POA: Insufficient documentation

## 2023-11-14 MED ORDER — OCTREOTIDE ACETATE 30 MG IM KIT
30.0000 mg | PACK | Freq: Once | INTRAMUSCULAR | Status: AC
  Administered 2023-11-14: 30 mg via INTRAMUSCULAR
  Filled 2023-11-14: qty 1

## 2023-11-14 NOTE — Patient Instructions (Signed)
 [facility] - A DEPT OF [facility]  Discharge Instructions: Thank you for choosing [facility] to provide your oncology and hematology care.   If you have a lab appointment with the Cancer Center, please go directly to the Cancer Center and check in at the registration area.   Wear comfortable clothing and clothing appropriate for easy access to any Portacath or PICC line.   We strive to give you quality time with your provider. You may need to reschedule your appointment if you arrive late (15 or more minutes).  Arriving late affects you and other patients whose appointments are after yours.  Also, if you miss three or more appointments without notifying the office, you may be dismissed from the clinic at the provider's discretion.      For prescription refill requests, have your pharmacy contact our office and allow 72 hours for refills to be completed.    Today you received the following Sandostatin  injection.  Octreotide  Injection Solution What is this medication? OCTREOTIDE  (ok TREE oh tide) treats high levels of growth hormone (acromegaly). It works by reducing the amount of growth hormone your body makes. This reduces symptoms and the risk of health problems caused by too much growth hormone, such as diabetes and heart disease. It may also be used to treat diarrhea caused by neuroendocrine tumors. It works by slowing down the release of serotonin from the tumor cells. This reduces the number of bowel movements you have. This medicine may be used for other purposes; ask your health care provider or pharmacist if you have questions. COMMON BRAND NAME(S): Bynfezia , Sandostatin  What should I tell my care team before I take this medication? They need to know if you have any of these conditions: Diabetes Gallbladder disease Heart disease Kidney disease Liver disease Pancreatic disease Thyroid disease An unusual or allergic reaction to  octreotide , other medications, foods, dyes, or preservatives Pregnant or trying to get pregnant Breastfeeding How should I use this medication? This medication is injected under the skin or into a vein. It is usually given by your care team in a hospital or clinic setting. If you get this medication at home, you will be taught how to prepare and give it. Use exactly as directed. Take it as directed on the prescription label at the same time every day. Keep taking it unless your care team tells you to stop. Allow the injection solution to come to room temperature before use. Do not warm it artificially. It is important that you put your used needles and syringes in a special sharps container. Do not put them in a trash can. If you do not have a sharps container, call your pharmacist or care team to get one. Talk to your care team about the use of this medication in children. Special care may be needed. Overdosage: If you think you have taken too much of this medicine contact a poison control center or emergency room at once. NOTE: This medicine is only for you. Do not share this medicine with others. What if I miss a dose? If you miss a dose, take it as soon as you can. If it is almost time for your next dose, take only that dose. Do not take double or extra doses. What may interact with this medication? Bromocriptine Certain medications for blood pressure, heart disease, irregular heartbeat Cyclosporine Diuretics Medications for diabetes, including insulin Quinidine This list may not describe all possible interactions. Give your  health care provider a list of all the medicines, herbs, non-prescription drugs, or dietary supplements you use. Also tell them if you smoke, drink alcohol, or use illegal drugs. Some items may interact with your medicine. What should I watch for while using this medication? Visit your care team for regular checks on your progress. Tell your care team if your symptoms  do not start to get better or if they get worse. To help reduce irritation at the injection site, use a different site for each injection and make sure the solution is at room temperature before use. This medication may cause decreases in blood sugar. Signs of low blood sugar include chills, cool, pale skin or cold sweats, drowsiness, extreme hunger, fast heartbeat, headache, nausea, nervousness or anxiety, shakiness, trembling, unsteadiness, tiredness, or weakness. Contact your care team right away if you experience any of these symptoms. This medication may increase blood sugar. The risk may be higher in patients who already have diabetes. Ask your care team what you can do to lower your risk of diabetes while taking this medication. You should make sure you get enough vitamin B12 while you are taking this medication. Discuss the foods you eat and the vitamins you take with your care team. What side effects may I notice from receiving this medication? Side effects that you should report to your care team as soon as possible: Allergic reactions--skin rash, itching, hives, swelling of the face, lips, tongue, or throat Gallbladder problems--severe stomach pain, nausea, vomiting, fever Heart rhythm changes--fast or irregular heartbeat, dizziness, feeling faint or lightheaded, chest pain, trouble breathing High blood sugar (hyperglycemia)--increased thirst or amount of urine, unusual weakness or fatigue, blurry vision Low blood sugar (hypoglycemia)--tremors or shaking, anxiety, sweating, cold or clammy skin, confusion, dizziness, rapid heartbeat Low thyroid levels (hypothyroidism)--unusual weakness or fatigue, increased sensitivity to cold, constipation, hair loss, dry skin, weight gain, feelings of depression Low vitamin B12 level--pain, tingling, or numbness in the hands or feet, muscle weakness, dizziness, confusion, trouble concentrating Oily or light-colored stools, diarrhea, bloating, weight  loss Pancreatitis--severe stomach pain that spreads to your back or gets worse after eating or when touched, fever, nausea, vomiting Slow heartbeat--dizziness, feeling faint or lightheaded, confusion, trouble breathing, unusual weakness or fatigue Side effects that usually do not require medical attention (report these to your care team if they continue or are bothersome): Diarrhea Dizziness Headache Nausea Pain, redness, or irritation at injection site Stomach pain This list may not describe all possible side effects. Call your doctor for medical advice about side effects. You may report side effects to FDA at 1-800-FDA-1088. Where should I keep my medication? Keep out of the reach of children and pets. Store in the refrigerator. Protect from light. Allow to come to room temperature naturally. Do not use artificial heat. If protected from light, the injection may be stored between 20 and 30 degrees C (70 and 86 degrees F) for 14 days. After the initial use, throw away any unused portion of a multiple dose vial after 14 days. Get rid of any unused portions of the ampules after use. To get rid of medications that are no longer needed or have expired: Take the medication to a medication take-back program. Ask your pharmacy or law enforcement to find a location. If you cannot return the medication, ask your pharmacist or care team how to get rid of the medication safely. NOTE: This sheet is a summary. It may not cover all possible information. If you have questions about this  medicine, talk to your doctor, pharmacist, or health care provider.  2024 Elsevier/Gold Standard (2022-12-06 00:00:00)  To help prevent nausea and vomiting after your treatment, we encourage you to take your nausea medication as directed.  BELOW ARE SYMPTOMS THAT SHOULD BE REPORTED IMMEDIATELY: *FEVER GREATER THAN 100.4 F (38 C) OR HIGHER *CHILLS OR SWEATING *NAUSEA AND VOMITING THAT IS NOT CONTROLLED WITH YOUR NAUSEA  MEDICATION *UNUSUAL SHORTNESS OF BREATH *UNUSUAL BRUISING OR BLEEDING *URINARY PROBLEMS (pain or burning when urinating, or frequent urination) *BOWEL PROBLEMS (unusual diarrhea, constipation, pain near the anus) TENDERNESS IN MOUTH AND THROAT WITH OR WITHOUT PRESENCE OF ULCERS (sore throat, sores in mouth, or a toothache) UNUSUAL RASH, SWELLING OR PAIN  UNUSUAL VAGINAL DISCHARGE OR ITCHING   Items with * indicate a potential emergency and should be followed up as soon as possible or go to the Emergency Department if any problems should occur.  Please show the CHEMOTHERAPY ALERT CARD or IMMUNOTHERAPY ALERT CARD at check-in to the Emergency Department and triage nurse.  Should you have questions after your visit or need to cancel or reschedule your appointment, please contact [facility] - A DEPT OF [facility]  Dept: [phone]  and follow the prompts.  Office hours are 8:00 a.m. to 4:30 p.m. Monday - Friday. Please note that voicemails left after 4:00 p.m. may not be returned until the following business day.  We are closed weekends and major holidays. You have access to a nurse at all times for urgent questions. Please call the main number to the clinic Dept: [phone] and follow the prompts.   For any non-urgent questions, you may also contact your provider using MyChart. We now offer e-Visits for anyone 46 and older to request care online for non-urgent symptoms. For details visit [email or web address].   Also download the MyChart app! Go to the app store, search MyChart, open the app, select [place], and log in with your MyChart username and password.

## 2023-12-10 ENCOUNTER — Inpatient Hospital Stay: Attending: Oncology

## 2023-12-10 VITALS — BP 100/69 | HR 53 | Temp 97.9°F | Resp 15

## 2023-12-10 DIAGNOSIS — C7A019 Malignant carcinoid tumor of the small intestine, unspecified portion: Secondary | ICD-10-CM | POA: Diagnosis present

## 2023-12-10 DIAGNOSIS — Z79899 Other long term (current) drug therapy: Secondary | ICD-10-CM | POA: Diagnosis not present

## 2023-12-10 MED ORDER — OCTREOTIDE ACETATE 30 MG IM KIT
30.0000 mg | PACK | Freq: Once | INTRAMUSCULAR | Status: AC
  Administered 2023-12-10: 30 mg via INTRAMUSCULAR
  Filled 2023-12-10: qty 1

## 2023-12-10 NOTE — Patient Instructions (Signed)
 [facility] - A DEPT OF [facility]  Discharge Instructions: Thank you for choosing [facility] to provide your oncology and hematology care.   If you have a lab appointment with the Cancer Center, please go directly to the Cancer Center and check in at the registration area.   Wear comfortable clothing and clothing appropriate for easy access to any Portacath or PICC line.   We strive to give you quality time with your provider. You may need to reschedule your appointment if you arrive late (15 or more minutes).  Arriving late affects you and other patients whose appointments are after yours.  Also, if you miss three or more appointments without notifying the office, you may be dismissed from the clinic at the provider's discretion.      For prescription refill requests, have your pharmacy contact our office and allow 72 hours for refills to be completed.    Today you received the following Sandostatin  injection.  Octreotide  Injection Solution What is this medication? OCTREOTIDE  (ok TREE oh tide) treats high levels of growth hormone (acromegaly). It works by reducing the amount of growth hormone your body makes. This reduces symptoms and the risk of health problems caused by too much growth hormone, such as diabetes and heart disease. It may also be used to treat diarrhea caused by neuroendocrine tumors. It works by slowing down the release of serotonin from the tumor cells. This reduces the number of bowel movements you have. This medicine may be used for other purposes; ask your health care provider or pharmacist if you have questions. COMMON BRAND NAME(S): Bynfezia , Sandostatin  What should I tell my care team before I take this medication? They need to know if you have any of these conditions: Diabetes Gallbladder disease Heart disease Kidney disease Liver disease Pancreatic disease Thyroid disease An unusual or allergic reaction to  octreotide , other medications, foods, dyes, or preservatives Pregnant or trying to get pregnant Breastfeeding How should I use this medication? This medication is injected under the skin or into a vein. It is usually given by your care team in a hospital or clinic setting. If you get this medication at home, you will be taught how to prepare and give it. Use exactly as directed. Take it as directed on the prescription label at the same time every day. Keep taking it unless your care team tells you to stop. Allow the injection solution to come to room temperature before use. Do not warm it artificially. It is important that you put your used needles and syringes in a special sharps container. Do not put them in a trash can. If you do not have a sharps container, call your pharmacist or care team to get one. Talk to your care team about the use of this medication in children. Special care may be needed. Overdosage: If you think you have taken too much of this medicine contact a poison control center or emergency room at once. NOTE: This medicine is only for you. Do not share this medicine with others. What if I miss a dose? If you miss a dose, take it as soon as you can. If it is almost time for your next dose, take only that dose. Do not take double or extra doses. What may interact with this medication? Bromocriptine Certain medications for blood pressure, heart disease, irregular heartbeat Cyclosporine Diuretics Medications for diabetes, including insulin Quinidine This list may not describe all possible interactions. Give your  health care provider a list of all the medicines, herbs, non-prescription drugs, or dietary supplements you use. Also tell them if you smoke, drink alcohol, or use illegal drugs. Some items may interact with your medicine. What should I watch for while using this medication? Visit your care team for regular checks on your progress. Tell your care team if your symptoms  do not start to get better or if they get worse. To help reduce irritation at the injection site, use a different site for each injection and make sure the solution is at room temperature before use. This medication may cause decreases in blood sugar. Signs of low blood sugar include chills, cool, pale skin or cold sweats, drowsiness, extreme hunger, fast heartbeat, headache, nausea, nervousness or anxiety, shakiness, trembling, unsteadiness, tiredness, or weakness. Contact your care team right away if you experience any of these symptoms. This medication may increase blood sugar. The risk may be higher in patients who already have diabetes. Ask your care team what you can do to lower your risk of diabetes while taking this medication. You should make sure you get enough vitamin B12 while you are taking this medication. Discuss the foods you eat and the vitamins you take with your care team. What side effects may I notice from receiving this medication? Side effects that you should report to your care team as soon as possible: Allergic reactions--skin rash, itching, hives, swelling of the face, lips, tongue, or throat Gallbladder problems--severe stomach pain, nausea, vomiting, fever Heart rhythm changes--fast or irregular heartbeat, dizziness, feeling faint or lightheaded, chest pain, trouble breathing High blood sugar (hyperglycemia)--increased thirst or amount of urine, unusual weakness or fatigue, blurry vision Low blood sugar (hypoglycemia)--tremors or shaking, anxiety, sweating, cold or clammy skin, confusion, dizziness, rapid heartbeat Low thyroid levels (hypothyroidism)--unusual weakness or fatigue, increased sensitivity to cold, constipation, hair loss, dry skin, weight gain, feelings of depression Low vitamin B12 level--pain, tingling, or numbness in the hands or feet, muscle weakness, dizziness, confusion, trouble concentrating Oily or light-colored stools, diarrhea, bloating, weight  loss Pancreatitis--severe stomach pain that spreads to your back or gets worse after eating or when touched, fever, nausea, vomiting Slow heartbeat--dizziness, feeling faint or lightheaded, confusion, trouble breathing, unusual weakness or fatigue Side effects that usually do not require medical attention (report these to your care team if they continue or are bothersome): Diarrhea Dizziness Headache Nausea Pain, redness, or irritation at injection site Stomach pain This list may not describe all possible side effects. Call your doctor for medical advice about side effects. You may report side effects to FDA at 1-800-FDA-1088. Where should I keep my medication? Keep out of the reach of children and pets. Store in the refrigerator. Protect from light. Allow to come to room temperature naturally. Do not use artificial heat. If protected from light, the injection may be stored between 20 and 30 degrees C (70 and 86 degrees F) for 14 days. After the initial use, throw away any unused portion of a multiple dose vial after 14 days. Get rid of any unused portions of the ampules after use. To get rid of medications that are no longer needed or have expired: Take the medication to a medication take-back program. Ask your pharmacy or law enforcement to find a location. If you cannot return the medication, ask your pharmacist or care team how to get rid of the medication safely. NOTE: This sheet is a summary. It may not cover all possible information. If you have questions about this  medicine, talk to your doctor, pharmacist, or health care provider.  2024 Elsevier/Gold Standard (2022-12-06 00:00:00)  To help prevent nausea and vomiting after your treatment, we encourage you to take your nausea medication as directed.  BELOW ARE SYMPTOMS THAT SHOULD BE REPORTED IMMEDIATELY: *FEVER GREATER THAN 100.4 F (38 C) OR HIGHER *CHILLS OR SWEATING *NAUSEA AND VOMITING THAT IS NOT CONTROLLED WITH YOUR NAUSEA  MEDICATION *UNUSUAL SHORTNESS OF BREATH *UNUSUAL BRUISING OR BLEEDING *URINARY PROBLEMS (pain or burning when urinating, or frequent urination) *BOWEL PROBLEMS (unusual diarrhea, constipation, pain near the anus) TENDERNESS IN MOUTH AND THROAT WITH OR WITHOUT PRESENCE OF ULCERS (sore throat, sores in mouth, or a toothache) UNUSUAL RASH, SWELLING OR PAIN  UNUSUAL VAGINAL DISCHARGE OR ITCHING   Items with * indicate a potential emergency and should be followed up as soon as possible or go to the Emergency Department if any problems should occur.  Please show the CHEMOTHERAPY ALERT CARD or IMMUNOTHERAPY ALERT CARD at check-in to the Emergency Department and triage nurse.  Should you have questions after your visit or need to cancel or reschedule your appointment, please contact [facility] - A DEPT OF [facility]  Dept: [phone]  and follow the prompts.  Office hours are 8:00 a.m. to 4:30 p.m. Monday - Friday. Please note that voicemails left after 4:00 p.m. may not be returned until the following business day.  We are closed weekends and major holidays. You have access to a nurse at all times for urgent questions. Please call the main number to the clinic Dept: [phone] and follow the prompts.   For any non-urgent questions, you may also contact your provider using MyChart. We now offer e-Visits for anyone 46 and older to request care online for non-urgent symptoms. For details visit [email or web address].   Also download the MyChart app! Go to the app store, search MyChart, open the app, select [place], and log in with your MyChart username and password.

## 2024-01-05 ENCOUNTER — Encounter: Payer: Self-pay | Admitting: Oncology

## 2024-01-14 ENCOUNTER — Inpatient Hospital Stay

## 2024-01-14 ENCOUNTER — Inpatient Hospital Stay: Attending: Oncology

## 2024-01-14 VITALS — BP 97/75 | HR 59 | Temp 98.2°F | Resp 16

## 2024-01-14 DIAGNOSIS — C7A019 Malignant carcinoid tumor of the small intestine, unspecified portion: Secondary | ICD-10-CM | POA: Insufficient documentation

## 2024-01-14 DIAGNOSIS — Z79899 Other long term (current) drug therapy: Secondary | ICD-10-CM | POA: Insufficient documentation

## 2024-01-14 MED ORDER — OCTREOTIDE ACETATE 30 MG IM KIT
30.0000 mg | PACK | Freq: Once | INTRAMUSCULAR | Status: AC
  Administered 2024-01-14: 30 mg via INTRAMUSCULAR
  Filled 2024-01-14: qty 1

## 2024-01-16 LAB — CHROMOGRANIN A: Chromogranin A (ng/mL): 73.3 ng/mL (ref 0.0–101.8)

## 2024-02-09 ENCOUNTER — Encounter: Payer: Self-pay | Admitting: Oncology

## 2024-02-10 ENCOUNTER — Telehealth: Payer: Self-pay | Admitting: Oncology

## 2024-02-11 ENCOUNTER — Inpatient Hospital Stay

## 2024-02-11 ENCOUNTER — Inpatient Hospital Stay: Admitting: Oncology

## 2024-02-13 ENCOUNTER — Inpatient Hospital Stay: Attending: Oncology

## 2024-02-13 ENCOUNTER — Inpatient Hospital Stay

## 2024-02-13 ENCOUNTER — Inpatient Hospital Stay: Admitting: Oncology

## 2024-02-13 VITALS — BP 99/71 | HR 71 | Temp 98.1°F | Resp 18 | Ht 65.0 in | Wt 133.7 lb

## 2024-02-13 DIAGNOSIS — C7A019 Malignant carcinoid tumor of the small intestine, unspecified portion: Secondary | ICD-10-CM

## 2024-02-13 MED ORDER — OCTREOTIDE ACETATE 30 MG IM KIT
30.0000 mg | PACK | Freq: Once | INTRAMUSCULAR | Status: AC
  Administered 2024-02-13: 30 mg via INTRAMUSCULAR
  Filled 2024-02-13: qty 1

## 2024-02-13 NOTE — Progress Notes (Signed)
 " [facility] OFFICE PROGRESS NOTE   Diagnosis: Carcinoid tumor  INTERVAL HISTORY:   Brittney [name] returns as scheduled.  She continues monthly Sandostatin .  No flushing or diarrhea.  She feels well.  She is exercising.  She reports tingling in the fingers for the past few weeks.  This occurs in the mornings.  No numbness or weakness.  No neck pain.  She recently did a number of push-ups in January.  Objective:  Vital signs in last 24 hours:  Blood pressure 99/71, pulse 71, temperature 98.1 F (36.7 C), temperature source Temporal, resp. rate 18, height 5' 5 (1.651 m), weight 133 lb 11.2 oz (60.6 kg), last menstrual period 01/31/2014, SpO2 100%.   Resp: Lungs clear bilaterally Cardio: Regular rate and rhythm GI: No mass, nontender, no hepatosplenomegaly, no apparent ascites Vascular: No leg edema Neuro: The hand strength is intact bilaterally  Lab Results:  Lab Results  Component Value Date   WBC 4.6 09/15/2023   HGB 13.6 09/15/2023   HCT 41.9 09/15/2023   MCV 90.7 09/15/2023   PLT 221 09/15/2023   NEUTROABS 2.2 09/15/2023    CMP  Lab Results  Component Value Date   NA 140 09/15/2023   K 4.1 09/15/2023   CL 102 09/15/2023   CO2 26 09/15/2023   GLUCOSE 89 09/15/2023   BUN 13 09/15/2023   CREATININE 0.70 09/15/2023   CALCIUM 9.7 09/15/2023   PROT 7.0 09/15/2023   ALBUMIN 4.1 09/15/2023   AST 27 09/15/2023   ALT 15 09/15/2023   ALKPHOS 83 09/15/2023   BILITOT 0.5 09/15/2023   GFRNONAA >60 09/15/2023   GFRAA >60 01/06/2019    No results found for: CEA1, CEA, CAN199, CA125  Lab Results  Component Value Date   INR 0.95 10/07/2016   LABPROT 12.6 10/07/2016    Imaging:  No results found.  Medications: I have reviewed the patient's current medications.   Assessment/Plan: Metastatic neuroendocrine tumor MRI of the abdomen 10/02/2016 confirmed multiple enhancing liver lesions consistent with metastases, no primary tumor site  identified Ultrasound-guided biopsy of a left liver lesion 10/07/2016-neuroendocrine neoplasm, intermediate grade , review of pathology at GI tumor conference consistent with a low-grade carcinoid tumor Elevated chromogranin A 10/15/2016  gallium-DOTATATE scan 10/23/2016-multiple foci of liver metastases, enlarged central mesenteric lymph nodes, short intussusception's in adjacent small bowel felt to represent a primary small bowel tumor, additional mesenteric lymph nodes with metabolic activity, deep right pelvic nodule consistent with a pelvic metastasis Small bowel resection 11/21/2016- mid ileum mass, low-grade neuroendocrine tumor,pT4,pN2, 6/18 lymph nodes positive, positive mesenteric resection margin (lymph node), intramural satellite nodule CT 11/30/2016-small bowel obstruction with transition point adjacent to suture line at the distal small bowel, liver metastases similar to the 10/02/2016 MRI Recurrent obstructive symptoms requiring repeat surgery with resection of the small bowel anastomosis site 12/13/2016, pathology negative for malignancy Initiation of monthly Sandostatin  12/20/2016 CT abdomen/pelvis 02/19/2017-mild enlargement of liver lesions compared to November 2018 Cycle 1 Lutathera  06/17/2017 Cycle 2 Lutathera  08/13/2017 Cycle 3 Lutathera  10/07/2017 Cycle 4 Lutathera  12/02/2017 Dotatate scan 12/29/2017-no evidence of new or metastatic disease or progression.  Multiple hepatic metastasis.  Lesions have decreased in size from the most recent CT scan and slightly increased in radiotracer activity from dotatate PET scan 10/23/2016.  Interval resection of small bowel tumor and bulky mesenteric metastasis.  No residual activity in the bowel or mesentery.  Single small focus of activity in the deep right pelvis along the peritoneal surface not changed from comparison  exam. Monthly Sandostatin  continued Netspot  06/23/2018- mild decrease in radiotracer activity of all hepatic metastases, no new  lesions, slight enlargement of several liver lesions Monthly Sandostatin  continued Netspot  03/31/2019-no new liver lesions, no change in the size of liver lesions with a mild decrease in tracer activity involving several lesions, stable right pelvic peritoneal implant Monthly Sandostatin  continued Netspot  12/07/2019-no new liver lesion, focus of activity left facet joint at C4-C5 has increased, intense activity associated with fractures of the left ninth and 10th ribs, unchanged size and number of liver lesions with increase in radiotracer activity-potentially indicating mild progression Netspot  08/14/2020-decrease in radiotracer activity associated with liver lesions, no new lesions, the liver lesions have not enlarged, decreased radiotracer activity associated with a small right pelvic peritoneal implant, decreased activity associated with degenerative changes in the cervical and thoracic spine, no evidence of progressive disease Monthly Sandostatin  continued Netspot  05/28/2021-stable multifocal radiotracer avid liver metastases, no increase in size, no new liver lesions, stable small deep right pelvic implant, no evidence of progressive disease Monthly Sandostatin  continued Netspot  05/10/2022-no change in size or radiotracer activity associated with liver metastases, no new lesion, stable small peritoneal lesion in the right pelvis Monthly Sandostatin  continued Dotatate PET scan 06/13/2023-near identical pattern of well-differentiated neuroendocrine tumor metastasis within the liver.  No evidence of disease progression.  Single small peritoneal implant deep right pelvis unchanged.  No new or progressive disease Monthly Sandostatin  continued   2.  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  Intermittent abdominal bloating and left-sided abdominal pain- resolved   4.  Admission 11/30/2016 with a small bowel obstruction       Disposition: Brittney [name] appears stable.  There is no clinical evidence for progression of the metastatic carcinoid tumor.  The chromogranin a level was normal 01/14/2024.  She will continue every 4-week Sandostatin .  She will be scheduled for a restaging dotatate PET in late May.  The hand tingling is most likely related to her exercise.  She will call for persistent tingling or new symptoms.  Brittney [name] will be scheduled for an office visit in approximately 3 months.  Arley Hof, MD  02/13/2024  11:32 AM   "

## 2024-03-11 ENCOUNTER — Inpatient Hospital Stay

## 2024-04-08 ENCOUNTER — Inpatient Hospital Stay

## 2024-05-06 ENCOUNTER — Inpatient Hospital Stay: Admitting: Nurse Practitioner

## 2024-05-06 ENCOUNTER — Inpatient Hospital Stay

## 2024-06-03 ENCOUNTER — Inpatient Hospital Stay

## 2024-07-01 ENCOUNTER — Inpatient Hospital Stay
